# Patient Record
Sex: Male | Born: 1935 | Race: White | Hispanic: No | Marital: Married | Smoking: Former smoker
Health system: Southern US, Community
[De-identification: ages and names within clinical notes are randomized; demographics above are authoritative.]

## PROBLEM LIST (undated history)

## (undated) DIAGNOSIS — I509 Heart failure, unspecified: Secondary | ICD-10-CM

## (undated) DIAGNOSIS — I10 Essential (primary) hypertension: Secondary | ICD-10-CM

## (undated) DIAGNOSIS — R011 Cardiac murmur, unspecified: Secondary | ICD-10-CM

## (undated) DIAGNOSIS — K219 Gastro-esophageal reflux disease without esophagitis: Secondary | ICD-10-CM

## (undated) HISTORY — PX: CHOLECYSTECTOMY: SHX55

## (undated) HISTORY — PX: CATARACT EXTRACTION W/ INTRAOCULAR LENS  IMPLANT, BILATERAL: SHX1307

## (undated) HISTORY — DX: Essential (primary) hypertension: I10

## (undated) HISTORY — PX: COLONOSCOPY: SHX174

## (undated) HISTORY — DX: Cardiac murmur, unspecified: R01.1

## (undated) HISTORY — DX: Gastro-esophageal reflux disease without esophagitis: K21.9

## (undated) HISTORY — DX: Heart failure, unspecified: I50.9

---

## 1999-11-05 ENCOUNTER — Emergency Department (HOSPITAL_COMMUNITY): Admission: EM | Admit: 1999-11-05 | Discharge: 1999-11-05 | Payer: Self-pay | Admitting: *Deleted

## 1999-11-17 ENCOUNTER — Emergency Department (HOSPITAL_COMMUNITY): Admission: EM | Admit: 1999-11-17 | Discharge: 1999-11-18 | Payer: Self-pay | Admitting: Emergency Medicine

## 2012-03-03 ENCOUNTER — Encounter: Payer: Self-pay | Admitting: Gastroenterology

## 2012-11-24 ENCOUNTER — Encounter: Payer: Self-pay | Admitting: Gastroenterology

## 2012-11-25 ENCOUNTER — Encounter: Payer: Self-pay | Admitting: Gastroenterology

## 2013-01-03 ENCOUNTER — Ambulatory Visit (AMBULATORY_SURGERY_CENTER): Payer: Medicare Other | Admitting: *Deleted

## 2013-01-03 VITALS — Ht 71.0 in | Wt 181.8 lb

## 2013-01-03 DIAGNOSIS — Z1211 Encounter for screening for malignant neoplasm of colon: Secondary | ICD-10-CM

## 2013-01-03 MED ORDER — NA SULFATE-K SULFATE-MG SULF 17.5-3.13-1.6 GM/177ML PO SOLN: ORAL | Status: DC

## 2013-01-03 NOTE — Progress Notes (Signed)
No egg or soy allergy  Pt does not have a current email address

## 2013-01-04 ENCOUNTER — Encounter: Payer: Self-pay | Admitting: Gastroenterology

## 2013-01-17 ENCOUNTER — Ambulatory Visit (AMBULATORY_SURGERY_CENTER): Payer: Medicare Other | Admitting: Gastroenterology

## 2013-01-17 ENCOUNTER — Encounter: Payer: Self-pay | Admitting: Gastroenterology

## 2013-01-17 VITALS — BP 122/67 | HR 69 | Temp 96.6°F | Resp 14 | Ht 71.0 in | Wt 181.0 lb

## 2013-01-17 DIAGNOSIS — K5520 Angiodysplasia of colon without hemorrhage: Secondary | ICD-10-CM

## 2013-01-17 DIAGNOSIS — Z1211 Encounter for screening for malignant neoplasm of colon: Secondary | ICD-10-CM

## 2013-01-17 DIAGNOSIS — D126 Benign neoplasm of colon, unspecified: Secondary | ICD-10-CM

## 2013-01-17 DIAGNOSIS — Q2733 Arteriovenous malformation of digestive system vessel: Secondary | ICD-10-CM

## 2013-01-17 DIAGNOSIS — K5730 Diverticulosis of large intestine without perforation or abscess without bleeding: Secondary | ICD-10-CM

## 2013-01-17 MED ORDER — SODIUM CHLORIDE 0.9 % IV SOLN: 500.0000 mL | INTRAVENOUS | Status: DC

## 2013-01-17 NOTE — Progress Notes (Signed)
Patient did not experience any of the following events: a burn prior to discharge; a fall within the facility; wrong site/side/patient/procedure/implant event; or a hospital transfer or hospital admission upon discharge from the facility. (G8907) Patient did not have preoperative order for IV antibiotic SSI prophylaxis. (G8918)  

## 2013-01-17 NOTE — Patient Instructions (Signed)
YOU HAD AN ENDOSCOPIC PROCEDURE TODAY AT THE [facility]: Refer to the procedure report that was given to you for any specific questions about what was found during the examination.  If the procedure report does not answer your questions, please call your gastroenterologist to clarify.  If you requested that your care partner not be given the details of your procedure findings, then the procedure report has been included in a sealed envelope for you to review at your convenience later.  YOU SHOULD EXPECT: Some feelings of bloating in the abdomen. Passage of more gas than usual.  Walking can help get rid of the air that was put into your GI tract during the procedure and reduce the bloating. If you had a lower endoscopy (such as a colonoscopy or flexible sigmoidoscopy) you may notice spotting of blood in your stool or on the toilet paper. If you underwent a bowel prep for your procedure, then you may not have a normal bowel movement for a few days.  DIET: Your first meal following the procedure should be a light meal and then it is ok to progress to your normal diet.  A half-sandwich or bowl of soup is an example of a good first meal.  Heavy or fried foods are harder to digest and may make you feel nauseous or bloated.  Likewise meals heavy in dairy and vegetables can cause extra gas to form and this can also increase the bloating.  Drink plenty of fluids but you should avoid alcoholic beverages for 24 hours.  ACTIVITY: Your care partner should take you home directly after the procedure.  You should plan to take it easy, moving slowly for the rest of the day.  You can resume normal activity the day after the procedure however you should NOT DRIVE or use heavy machinery for 24 hours (because of the sedation medicines used during the test).    SYMPTOMS TO REPORT IMMEDIATELY: A gastroenterologist can be reached at any hour.  During normal business hours, 8:30 AM to 5:00 PM Monday through Friday,  call [phone].  After hours and on weekends, please call the GI answering service at [phone] who will take a message and have the physician on call contact you.   Following lower endoscopy (colonoscopy or flexible sigmoidoscopy):  Excessive amounts of blood in the stool  Significant tenderness or worsening of abdominal pains  Swelling of the abdomen that is new, acute  Fever of 100F or higher    FOLLOW UP: If any biopsies were taken you will be contacted by phone or by letter within the next 1-3 weeks.  Call your gastroenterologist if you have not heard about the biopsies in 3 weeks.  Our staff will call the home number listed on your records the next business day following your procedure to check on you and address any questions or concerns that you may have at that time regarding the information given to you following your procedure. This is a courtesy call and so if there is no answer at the home number and we have not heard from you through the emergency physician on call, we will assume that you have returned to your regular daily activities without incident.  SIGNATURES/CONFIDENTIALITY: You and/or your care partner have signed paperwork which will be entered into your electronic medical record.  These signatures attest to the fact that that the information above on your After Visit Summary has been reviewed and is understood.  Full responsibility of the confidentiality   of this discharge information lies with you and/or your care-partner.     

## 2013-01-17 NOTE — Op Note (Signed)
[facility] [address] [place], [address]   COLONOSCOPY PROCEDURE REPORT  PATIENT: Shaquelle, Hernon  MR#: [id number] BIRTHDATE: 19-May-1936 , 77  yrs. old GENDER: Male ENDOSCOPIST: Louis Meckel, MD REFERRED XB:JYNWG Chilton Si, M.D. PROCEDURE DATE:  01/17/2013 PROCEDURE:   Colonoscopy with snare polypectomy ASA CLASS:   Class II INDICATIONS: MEDICATIONS: MAC sedation, administered by CRNA and propofol (Diprivan) 200mg  IV  DESCRIPTION OF PROCEDURE:   After the risks benefits and alternatives of the procedure were thoroughly explained, informed consent was obtained.  A digital rectal exam revealed no abnormalities of the rectum.   The LB [id number]  endoscope was introduced through the anus and advanced to the cecum, which was identified by both the appendix and ileocecal valve. No adverse events experienced.   The quality of the prep was Suprep good  The instrument was then slowly withdrawn as the colon was fully examined.      COLON FINDINGS: In the cecum there were 4 discreet AVMs, nonbleeding, ranging from 4-64mm in size.  A single AVM was seen in the proximal ascending colon.   A sessile polyp measuring 3 mm in size was found in the rectum.  A polypectomy was performed with a cold snare.  The resection was complete and the polyp tissue was completely retrieved.   Mild diverticulosis was noted in the sigmoid colon.  Retroflexed views revealed no abnormalities. The time to cecum=1 minutes 39 seconds.  Withdrawal time=10 minutes 35 seconds.  The scope was withdrawn and the procedure completed. COMPLICATIONS: There were no complications.  ENDOSCOPIC IMPRESSION: 1.   In the cecum there were 4 discreet AVMs, nonbleeding, ranging from 4-5mm in size.  A single AVM was seen in the proximal ascending colon. 2.   Sessile polyp measuring 3 mm in size was found in the rectum; polypectomy was performed with a cold snare 3.   Mild diverticulosis was noted  in the sigmoid colon  RECOMMENDATIONS: Given your age, you will not need another colonoscopy for colon cancer screening or polyp surveillance.  These types of tests usually stop around the age 79.   eSigned:  Louis Meckel, MD 01/17/2013 9:14 AM   cc:   PATIENT NAME:  Jaedon, Siler MR#: [id number]

## 2013-01-17 NOTE — Progress Notes (Signed)
Called to room to assist during endoscopic procedure.  Patient ID and intended procedure confirmed with present staff. Received instructions for my participation in the procedure from the performing physician.  

## 2013-01-18 ENCOUNTER — Telehealth: Payer: Self-pay

## 2013-01-18 NOTE — Telephone Encounter (Signed)
  Follow up Call-  Call back number 01/17/2013  Post procedure Call Back phone  # [phone]  Permission to leave phone message Yes     Patient questions:  Do you have a fever, pain , or abdominal swelling? no Pain Score  0 *  Have you tolerated food without any problems? yes  Have you been able to return to your normal activities? yes  Do you have any questions about your discharge instructions: Diet   no Medications  no Follow up visit  no  Do you have questions or concerns about your Care? no  Actions: * If pain score is 4 or above: No action needed, pain <4.

## 2013-01-24 ENCOUNTER — Encounter: Payer: Self-pay | Admitting: Gastroenterology

## 2014-09-24 ENCOUNTER — Other Ambulatory Visit: Payer: Self-pay | Admitting: Dermatology

## 2019-06-15 DIAGNOSIS — I10 Essential (primary) hypertension: Secondary | ICD-10-CM | POA: Insufficient documentation

## 2019-06-15 DIAGNOSIS — B0229 Other postherpetic nervous system involvement: Secondary | ICD-10-CM | POA: Insufficient documentation

## 2019-06-15 DIAGNOSIS — K219 Gastro-esophageal reflux disease without esophagitis: Secondary | ICD-10-CM | POA: Insufficient documentation

## 2019-06-15 DIAGNOSIS — Z720 Tobacco use: Secondary | ICD-10-CM | POA: Insufficient documentation

## 2019-09-18 DIAGNOSIS — Z0000 Encounter for general adult medical examination without abnormal findings: Secondary | ICD-10-CM | POA: Insufficient documentation

## 2020-04-13 ENCOUNTER — Ambulatory Visit: Payer: Medicare Other | Attending: Internal Medicine

## 2020-04-13 DIAGNOSIS — Z23 Encounter for immunization: Secondary | ICD-10-CM

## 2020-04-13 NOTE — Progress Notes (Signed)
   Covid-19 Vaccination Clinic  Name:  TREMAR WICKENS    MRN: [id number] DOB: 01-22-1936  04/13/2020  Mr. Haliburton was observed post Covid-19 immunization for 15 minutes without incident. He was provided with Vaccine Information Sheet and instruction to access the V-Safe system.   Mr. Gittleman was instructed to call 911 with any severe reactions post vaccine: Marland Kitchen Difficulty breathing  . Swelling of face and throat  . A fast heartbeat  . A bad rash all over body  . Dizziness and weakness

## 2021-02-04 ENCOUNTER — Other Ambulatory Visit: Payer: Self-pay

## 2021-02-04 ENCOUNTER — Emergency Department (HOSPITAL_BASED_OUTPATIENT_CLINIC_OR_DEPARTMENT_OTHER)
Admission: EM | Admit: 2021-02-04 | Discharge: 2021-02-04 | Disposition: A | Discharge status: Home / Self Care | Payer: Medicare Other | Attending: Emergency Medicine | Admitting: Emergency Medicine

## 2021-02-04 ENCOUNTER — Encounter (HOSPITAL_BASED_OUTPATIENT_CLINIC_OR_DEPARTMENT_OTHER): Payer: Self-pay | Admitting: Obstetrics and Gynecology

## 2021-02-04 DIAGNOSIS — N3001 Acute cystitis with hematuria: Secondary | ICD-10-CM | POA: Diagnosis not present

## 2021-02-04 DIAGNOSIS — Z79899 Other long term (current) drug therapy: Secondary | ICD-10-CM | POA: Insufficient documentation

## 2021-02-04 DIAGNOSIS — Z7982 Long term (current) use of aspirin: Secondary | ICD-10-CM | POA: Diagnosis not present

## 2021-02-04 DIAGNOSIS — I10 Essential (primary) hypertension: Secondary | ICD-10-CM | POA: Diagnosis not present

## 2021-02-04 DIAGNOSIS — F17210 Nicotine dependence, cigarettes, uncomplicated: Secondary | ICD-10-CM | POA: Insufficient documentation

## 2021-02-04 DIAGNOSIS — R350 Frequency of micturition: Secondary | ICD-10-CM | POA: Diagnosis present

## 2021-02-04 LAB — URINALYSIS, ROUTINE W REFLEX MICROSCOPIC
Bilirubin Urine: NEGATIVE
Glucose, UA: NEGATIVE mg/dL
Ketones, ur: NEGATIVE mg/dL
Nitrite: POSITIVE — AB
Protein, ur: 30 mg/dL — AB
RBC / HPF: >50 RBC/hpf — ABNORMAL HIGH (ref 0–5)
Specific Gravity, Urine: 1.019 (ref 1.005–1.030)
WBC, UA: >50 WBC/hpf — ABNORMAL HIGH (ref 0–5)
pH: 8 (ref 5.0–8.0)

## 2021-02-04 MED ORDER — CEPHALEXIN 250 MG PO CAPS
500.0000 mg | ORAL_CAPSULE | Freq: Once | ORAL | Status: AC
  Administered 2021-02-04: 500 mg via ORAL
  Filled 2021-02-04: qty 2

## 2021-02-04 MED ORDER — PHENAZOPYRIDINE HCL 100 MG PO TABS
200.0000 mg | ORAL_TABLET | Freq: Once | ORAL | Status: AC
  Administered 2021-02-04: 200 mg via ORAL
  Filled 2021-02-04: qty 2

## 2021-02-04 MED ORDER — CEPHALEXIN 500 MG PO CAPS: 500.0000 mg | ORAL_CAPSULE | Freq: Four times a day (QID) | ORAL | 0 refills | Status: DC

## 2021-02-04 NOTE — ED Notes (Signed)
This RN presented the AVS utilizing Teachback Method. Patient verbalizes understanding of Discharge Instructions. Opportunity for Questioning and Answers were provided. Patient Discharged from ED ambulatory to Home.   

## 2021-02-04 NOTE — ED Triage Notes (Signed)
Patient reports frequent urination that "hurts like hell" and reports he is only getting 5-6 teaspoons of urine at a time. Patient reports he is going every 15 minutes.

## 2021-02-04 NOTE — ED Provider Notes (Signed)
[facility] Provider Note   CSN: [id number] Arrival date & time: 02/04/21  1951     History Chief Complaint  Patient presents with   Urinary Frequency    Perry Rosales is a 85 y.o. male.  Patient is an 85 year old male with a history of GERD and hypertension and frequent urination who is presenting today with symptoms starting around noon of feeling feverish, malaise and urinary frequency urgency and dysuria.  He reports for some time now he has had hesitation with urinating and it takes a while to get going with a weak stream but today is the first time he has had any pain with urinating.  He denies any abdominal pain or back pain.  No cough, congestion, chest pain, shortness of breath.  He did take some Aleve at home prior to coming and reports he feels much better currently.  He does feel like he is emptying his bladder and does not have symptoms that feel like retention.  The history is provided by the patient.  Urinary Frequency      Past Medical History:  Diagnosis Date   GERD (gastroesophageal reflux disease)    Heart murmur    Hypertension     There are no problems to display for this patient.   Past Surgical History:  Procedure Laterality Date   CATARACT EXTRACTION W/ INTRAOCULAR LENS  IMPLANT, BILATERAL     CHOLECYSTECTOMY     COLONOSCOPY         Family History  Problem Relation Age of Onset   Colon cancer Neg Hx    Esophageal cancer Neg Hx    Rectal cancer Neg Hx    Stomach cancer Neg Hx     Social History   Tobacco Use   Smoking status: Every Day    Packs/day: 0.50    Years: 60.00    Pack years: 30.00    Types: Cigarettes    Passive exposure: Current   Smokeless tobacco: Never  Vaping Use   Vaping Use: Never used  Substance Use Topics   Alcohol use: No   Drug use: No    Home Medications Prior to Admission medications   Medication Sig Start Date End Date Taking? Authorizing Provider  amitriptyline (ELAVIL)  50 MG tablet Take 50 mg by mouth at bedtime.   Yes [provider]  aspirin 81 MG tablet Take 81 mg by mouth daily.   Yes [provider]  cephALEXin (KEFLEX) 500 MG capsule Take 1 capsule (500 mg total) by mouth 4 (four) times daily. 02/04/21  Yes Blanchie Dessert, MD  ibuprofen (ADVIL) 200 MG tablet Take 400 mg by mouth every 6 (six) hours as needed.   Yes [provider]  lisinopril (PRINIVIL,ZESTRIL) 20 MG tablet Take 10 mg by mouth daily.   Yes [provider]  omeprazole (PRILOSEC) 20 MG capsule Take 20 mg by mouth daily.   Yes [provider]  tamsulosin (FLOMAX) 0.4 MG CAPS capsule Take 0.8 mg by mouth.   Yes [provider]  traMADol-acetaminophen (ULTRACET) 37.5-325 MG per tablet Take 1 tablet by mouth every 6 (six) hours as needed for pain. Takes 5 tablets daily   Yes [provider]    Allergies    Patient has no known allergies.  Review of Systems   Review of Systems  Genitourinary:  Positive for frequency.  All other systems reviewed and are negative.  Physical Exam Updated Vital Signs BP (!) 165/57 (BP Location: Left Arm)  Pulse 89   Temp 99 F (37.2 C)   Resp 16   SpO2 97%   Physical Exam Vitals and nursing note reviewed.  Constitutional:      General: He is not in acute distress.    Appearance: He is well-developed.  HENT:     Head: Normocephalic and atraumatic.  Eyes:     Conjunctiva/sclera: Conjunctivae normal.     Pupils: Pupils are equal, round, and reactive to light.  Cardiovascular:     Rate and Rhythm: Normal rate and regular rhythm.     Heart sounds: No murmur heard. Pulmonary:     Effort: Pulmonary effort is normal. No respiratory distress.     Breath sounds: Normal breath sounds. No wheezing or rales.  Abdominal:     General: There is no distension.     Palpations: Abdomen is soft.     Tenderness: There is no abdominal tenderness. There is no guarding or rebound.  Musculoskeletal:         General: No tenderness. Normal range of motion.     Cervical back: Normal range of motion and neck supple.  Skin:    General: Skin is warm and dry.     Findings: No erythema or rash.  Neurological:     Mental Status: He is alert and oriented to person, place, and time. Mental status is at baseline.  Psychiatric:        Mood and Affect: Mood normal.        Behavior: Behavior normal.    ED Results / Procedures / Treatments   Labs (all labs ordered are listed, but only abnormal results are displayed) Labs Reviewed  URINALYSIS, ROUTINE W REFLEX MICROSCOPIC - Abnormal; Notable for the following components:      Result Value   APPearance CLOUDY (*)    Hgb urine dipstick MODERATE (*)    Protein, ur 30 (*)    Nitrite POSITIVE (*)    Leukocytes,Ua LARGE (*)    RBC / HPF >50 (*)    WBC, UA >50 (*)    Bacteria, UA MANY (*)    Non Squamous Epithelial 0-5 (*)    All other components within normal limits    EKG None  Radiology No results found.  Procedures Procedures   Medications Ordered in ED Medications  cephALEXin (KEFLEX) capsule 500 mg (has no administration in time range)  phenazopyridine (PYRIDIUM) tablet 200 mg (has no administration in time range)    ED Course  I have reviewed the triage vital signs and the nursing notes.  Pertinent labs & imaging results that were available during my care of the patient were reviewed by me and considered in my medical decision making (see chart for details).    MDM Rules/Calculators/A&P                           Patient presenting with symptoms most consistent with a UTI.  He has no abdominal pain or flank pain consistent with pyelonephritis at this time.  He is not having symptoms significant for urinary retention.  UA consistent with a UTI.  Does not ever recall having a UTI in the past.  We will treat with Keflex.  Urine culture sent.  Patient does have follow-up with urology later this month as he has had more more  issues most consistent with prostate enlargement.  MDM   Amount and/or Complexity of Data Reviewed Clinical lab tests: ordered and reviewed Independent visualization  of images, tracings, or specimens: yes    Final Clinical Impression(s) / ED Diagnoses Final diagnoses:  Acute cystitis with hematuria    Rx / DC Orders ED Discharge Orders          Ordered    cephALEXin (KEFLEX) 500 MG capsule  4 times daily        02/04/21 2300             Blanchie Dessert, MD 02/04/21 2306

## 2021-02-07 LAB — URINE CULTURE: Culture: >=100000 — AB

## 2021-02-08 ENCOUNTER — Telehealth: Payer: Self-pay | Admitting: Emergency Medicine

## 2021-02-08 NOTE — Telephone Encounter (Signed)
Post ED Visit - Positive Culture Follow-up  Culture report reviewed by antimicrobial stewardship pharmacist: [facility] Team '[]'$  [address], Pharm.D. '[]'$  Heide Guile, Pharm.D., BCPS AQ-ID '[]'$  Parks Neptune, Pharm.D., BCPS '[]'$  Alycia Rossetti, Pharm.D., BCPS '[]'$  [place], Pharm.D., BCPS, AAHIVP '[]'$  Legrand Como, Pharm.D., BCPS, AAHIVP '[]'$  Salome Arnt, PharmD, BCPS '[]'$  Johnnette Gourd, PharmD, BCPS '[]'$  Hughes Better, PharmD, BCPS '[x]'$  Lorelei Pont, PharmD '[]'$  Laqueta Linden, PharmD, BCPS '[]'$  Albertina Parr, PharmD  [facility] Team '[]'$  Leodis Sias, PharmD '[]'$  Lindell Spar, PharmD '[]'$  Royetta Asal, PharmD '[]'$  Graylin Shiver, Rph '[]'$  Rema Fendt) Glennon Mac, PharmD '[]'$  Arlyn Dunning, PharmD '[]'$  Netta Cedars, PharmD '[]'$  Dia Sitter, PharmD '[]'$  Leone Haven, PharmD '[]'$  Gretta Arab, PharmD '[]'$  Theodis Shove, PharmD '[]'$  Peggyann Juba, PharmD '[]'$  Reuel Boom, PharmD   Positive urine culture Treated with Cephalexin, organism sensitive to the same and no further patient follow-up is required at this time.  Milus Mallick 02/08/2021, 12:43 PM

## 2021-02-09 ENCOUNTER — Encounter (HOSPITAL_BASED_OUTPATIENT_CLINIC_OR_DEPARTMENT_OTHER): Payer: Self-pay

## 2021-02-09 ENCOUNTER — Inpatient Hospital Stay (HOSPITAL_BASED_OUTPATIENT_CLINIC_OR_DEPARTMENT_OTHER)
Admission: EM | Admit: 2021-02-09 | Discharge: 2021-02-12 | DRG: 683 | Disposition: A | Discharge status: Home / Self Care | Payer: Medicare Other | Attending: Family Medicine | Admitting: Family Medicine

## 2021-02-09 ENCOUNTER — Emergency Department (HOSPITAL_BASED_OUTPATIENT_CLINIC_OR_DEPARTMENT_OTHER): Payer: Medicare Other

## 2021-02-09 ENCOUNTER — Other Ambulatory Visit: Payer: Self-pay | Admitting: Internal Medicine

## 2021-02-09 ENCOUNTER — Other Ambulatory Visit: Payer: Self-pay

## 2021-02-09 DIAGNOSIS — Z9049 Acquired absence of other specified parts of digestive tract: Secondary | ICD-10-CM

## 2021-02-09 DIAGNOSIS — I2510 Atherosclerotic heart disease of native coronary artery without angina pectoris: Secondary | ICD-10-CM | POA: Diagnosis present

## 2021-02-09 DIAGNOSIS — N179 Acute kidney failure, unspecified: Principal | ICD-10-CM | POA: Diagnosis present

## 2021-02-09 DIAGNOSIS — N10 Acute tubulo-interstitial nephritis: Secondary | ICD-10-CM

## 2021-02-09 DIAGNOSIS — Z20822 Contact with and (suspected) exposure to covid-19: Secondary | ICD-10-CM | POA: Diagnosis present

## 2021-02-09 DIAGNOSIS — R338 Other retention of urine: Secondary | ICD-10-CM

## 2021-02-09 DIAGNOSIS — R197 Diarrhea, unspecified: Secondary | ICD-10-CM

## 2021-02-09 DIAGNOSIS — N138 Other obstructive and reflux uropathy: Secondary | ICD-10-CM | POA: Diagnosis present

## 2021-02-09 DIAGNOSIS — I714 Abdominal aortic aneurysm, without rupture, unspecified: Secondary | ICD-10-CM

## 2021-02-09 DIAGNOSIS — F17210 Nicotine dependence, cigarettes, uncomplicated: Secondary | ICD-10-CM | POA: Diagnosis present

## 2021-02-09 DIAGNOSIS — Z79899 Other long term (current) drug therapy: Secondary | ICD-10-CM

## 2021-02-09 DIAGNOSIS — I10 Essential (primary) hypertension: Secondary | ICD-10-CM | POA: Diagnosis present

## 2021-02-09 DIAGNOSIS — N401 Enlarged prostate with lower urinary tract symptoms: Secondary | ICD-10-CM | POA: Diagnosis present

## 2021-02-09 DIAGNOSIS — Z7982 Long term (current) use of aspirin: Secondary | ICD-10-CM

## 2021-02-09 DIAGNOSIS — N390 Urinary tract infection, site not specified: Secondary | ICD-10-CM | POA: Diagnosis present

## 2021-02-09 DIAGNOSIS — B9620 Unspecified Escherichia coli [E. coli] as the cause of diseases classified elsewhere: Secondary | ICD-10-CM | POA: Diagnosis present

## 2021-02-09 HISTORY — DX: Acute kidney failure, unspecified: N17.9

## 2021-02-09 LAB — COMPREHENSIVE METABOLIC PANEL
ALT: 15 U/L (ref 0–44)
AST: 17 U/L (ref 15–41)
Albumin: 4 g/dL (ref 3.5–5.0)
Alkaline Phosphatase: 65 U/L (ref 38–126)
Anion gap: 15 (ref 5–15)
BUN: 58 mg/dL — ABNORMAL HIGH (ref 8–23)
CO2: 22 mmol/L (ref 22–32)
Calcium: 8.3 mg/dL — ABNORMAL LOW (ref 8.9–10.3)
Chloride: 97 mmol/L — ABNORMAL LOW (ref 98–111)
Creatinine, Ser: 3.45 mg/dL — ABNORMAL HIGH (ref 0.61–1.24)
GFR, Estimated: 17 mL/min — ABNORMAL LOW (ref >60)
Glucose, Bld: 110 mg/dL — ABNORMAL HIGH (ref 70–99)
Potassium: 4.2 mmol/L (ref 3.5–5.1)
Sodium: 134 mmol/L — ABNORMAL LOW (ref 135–145)
Total Bilirubin: 0.5 mg/dL (ref 0.3–1.2)
Total Protein: 7.4 g/dL (ref 6.5–8.1)

## 2021-02-09 LAB — CBC WITH DIFFERENTIAL/PLATELET
Abs Immature Granulocytes: 0.51 10*3/uL — ABNORMAL HIGH (ref 0.00–0.07)
Basophils Absolute: 0.1 10*3/uL (ref 0.0–0.1)
Basophils Relative: 1 %
Eosinophils Absolute: 0.3 10*3/uL (ref 0.0–0.5)
Eosinophils Relative: 3 %
HCT: 39.9 % (ref 39.0–52.0)
Hemoglobin: 13.7 g/dL (ref 13.0–17.0)
Immature Granulocytes: 5 %
Lymphocytes Relative: 4 %
Lymphs Abs: 0.4 10*3/uL — ABNORMAL LOW (ref 0.7–4.0)
MCH: 30.6 pg (ref 26.0–34.0)
MCHC: 34.3 g/dL (ref 30.0–36.0)
MCV: 89.3 fL (ref 80.0–100.0)
Monocytes Absolute: 0.9 10*3/uL (ref 0.1–1.0)
Monocytes Relative: 8 %
Neutro Abs: 8.9 10*3/uL — ABNORMAL HIGH (ref 1.7–7.7)
Neutrophils Relative %: 79 %
Platelets: 160 10*3/uL (ref 150–400)
RBC: 4.47 MIL/uL (ref 4.22–5.81)
RDW: 12.4 % (ref 11.5–15.5)
WBC: 11.1 10*3/uL — ABNORMAL HIGH (ref 4.0–10.5)
nRBC: 0 % (ref 0.0–0.2)

## 2021-02-09 LAB — BASIC METABOLIC PANEL
Anion gap: 11 (ref 5–15)
BUN: 48 mg/dL — ABNORMAL HIGH (ref 8–23)
CO2: 23 mmol/L (ref 22–32)
Calcium: 8 mg/dL — ABNORMAL LOW (ref 8.9–10.3)
Chloride: 103 mmol/L (ref 98–111)
Creatinine, Ser: 2.64 mg/dL — ABNORMAL HIGH (ref 0.61–1.24)
GFR, Estimated: 23 mL/min — ABNORMAL LOW (ref >60)
Glucose, Bld: 125 mg/dL — ABNORMAL HIGH (ref 70–99)
Potassium: 3.7 mmol/L (ref 3.5–5.1)
Sodium: 137 mmol/L (ref 135–145)

## 2021-02-09 LAB — URINALYSIS, ROUTINE W REFLEX MICROSCOPIC
Glucose, UA: NEGATIVE mg/dL
Ketones, ur: NEGATIVE mg/dL
Nitrite: POSITIVE — AB
Protein, ur: 30 mg/dL — AB
Specific Gravity, Urine: 1.02 (ref 1.005–1.030)
WBC, UA: >50 WBC/hpf — ABNORMAL HIGH (ref 0–5)
pH: 6 (ref 5.0–8.0)

## 2021-02-09 LAB — RESP PANEL BY RT-PCR (FLU A&B, COVID) ARPGX2
Influenza A by PCR: NEGATIVE
Influenza B by PCR: NEGATIVE
SARS Coronavirus 2 by RT PCR: NEGATIVE

## 2021-02-09 LAB — MAGNESIUM
Magnesium: 0.6 mg/dL — CL (ref 1.7–2.4)
Magnesium: 1.4 mg/dL — ABNORMAL LOW (ref 1.7–2.4)

## 2021-02-09 MED ORDER — SODIUM CHLORIDE 0.9 % IV BOLUS
500.0000 mL | Freq: Once | INTRAVENOUS | Status: AC
  Administered 2021-02-09: 500 mL via INTRAVENOUS

## 2021-02-09 MED ORDER — MAGNESIUM SULFATE 2 GM/50ML IV SOLN
2.0000 g | Freq: Once | INTRAVENOUS | Status: AC
  Administered 2021-02-09: 2 g via INTRAVENOUS
  Filled 2021-02-09: qty 50

## 2021-02-09 MED ORDER — TRAMADOL HCL 50 MG PO TABS
50.0000 mg | ORAL_TABLET | Freq: Once | ORAL | Status: AC
  Administered 2021-02-09: 50 mg via ORAL
  Filled 2021-02-09: qty 1

## 2021-02-09 MED ORDER — MAGNESIUM SULFATE IN D5W 1-5 GM/100ML-% IV SOLN
1.0000 g | Freq: Once | INTRAVENOUS | Status: AC
  Administered 2021-02-09: 1 g via INTRAVENOUS
  Filled 2021-02-09: qty 100

## 2021-02-09 MED ORDER — SODIUM CHLORIDE 0.9 % IV SOLN
1.0000 g | Freq: Once | INTRAVENOUS | Status: AC
  Administered 2021-02-09: 1 g via INTRAVENOUS
  Filled 2021-02-09: qty 10

## 2021-02-09 NOTE — ED Notes (Signed)
hospitalist paged through [place]

## 2021-02-09 NOTE — ED Provider Notes (Signed)
Care of patient assumed from Dr. Reather Converse at 36:55 PM.  85 year old male presenting for 1 week of difficulty with urination.  Was started on Keflex 5 days ago.  Has since had diarrhea and nausea.  Found to have acute renal failure with suspected obstructive etiology.  Urinalysis continues to show infection.  IV Rocephin given.  Plan for admission. Physical Exam  BP (!) 145/59   Pulse 86   Temp 98.3 F (36.8 C) (Oral)   Resp 17   SpO2 95%   Physical Exam Constitutional:      Appearance: Normal appearance. He is not ill-appearing or toxic-appearing.  Genitourinary:    Comments: Foley bag with good orange-pink output Neurological:     Mental Status: He is alert and oriented to person, place, and time.    ED Course/Procedures     Procedures  MDM  Patient remained in the ED, awaiting transfer for admission throughout the shift.  On assessment, sitting in reclining chair with no current complaints.  He is tolerating p.o. intake.  Repeat lab work showed continued hypomagnesemia.  Additional 1 g of IV magnesium was ordered.       Godfrey Pick, MD 02/10/21 [phone]

## 2021-02-09 NOTE — ED Notes (Addendum)
Perry Rosales, [phone], son would like to be contacted with update on patient when he gets a room.

## 2021-02-09 NOTE — ED Notes (Signed)
CRITICAL VALUE STICKER  CRITICAL VALUE:Magnesium 0.6  RECEIVER (on-site recipient of call):Shawnie Pons, RN  DATE & TIME NOTIFIED: 02-09-2021 [phone]  MESSENGER (representative from lab):  MD NOTIFIED: Dr. Reather Converse  TIME OF NOTIFICATION:1000  RESPONSE:

## 2021-02-09 NOTE — ED Notes (Signed)
This nurse realized the bed request had not been made. I reached out to bed placement who stated they would reach out to the hospitalist.

## 2021-02-09 NOTE — ED Triage Notes (Signed)
He tells me he was seen for urinary hesitancy 5 days ago here and was prescribed an antibiotic. He tells me he is having some diarrhea and his urinary symptoms "aren't any better". He is ambulatory and in no distress.

## 2021-02-09 NOTE — Discharge Instructions (Addendum)
Take your Flomax/tamsulosin as previously prescribed. Follow-up with urology at appointment. Do not take your Keflex medication that was causing diarrhea. Use Tylenol as needed for pain or fevers every 4 hours. Return for new or worsening signs or symptoms. Stop taking lisinopril.  We have transitioned you to amlodipine 5 mg p.o. daily to control your blood pressure.

## 2021-02-09 NOTE — ED Notes (Signed)
Pt is back in bed and comfortable.

## 2021-02-09 NOTE — ED Notes (Signed)
Pt c/o muscle cramps in legs. Brought recliner in the room for patient to sit on. Pt states that is much more comfortable. Instructed to call when he is ready to get up. Foley below bladder. Watching TV. Cardiac monitor in place.

## 2021-02-09 NOTE — ED Notes (Addendum)
Called [facility] at 706pm in regards to bed request not being placed, [facility] provided me with doctor name who I sent message to at 710pm and requested that a bed request be made for patient per consult with [facility] at 1035am per 722pm message has not been read. Called [facility] at 738pm for update, stated they called the floor manager who attempted to reach Dr Marcello Moores with no luck so is going to have another hospitalist place the bed request

## 2021-02-09 NOTE — ED Notes (Signed)
Patient reports urinary retention, still having burning with scant urination. Patient also states that he stopped taking his flomax because "I thought it might interfere with my treatment", patient reminded that flomax is prescribed to assist in urination.

## 2021-02-09 NOTE — ED Notes (Signed)
Dr. Reather Converse present during bladder scan and aware of results.

## 2021-02-09 NOTE — ED Provider Notes (Signed)
[facility] EMERGENCY DEPT Provider Note   CSN: [id number] Arrival date & time: 02/09/21  [id number]     History Chief Complaint  Patient presents with   passing small amounts of urine    Perry Rosales is a 85 y.o. male.  Patient with history of reflux, high blood pressure presents with difficulty with urination over the past week.  Patient was seen approximately 5 days ago diagnosed with urinary infection.  Patient has been taking Keflex however has had gradually worsening diarrhea and nausea side effect from the antibiotics.  Patient continues to have fullness in his bladder and only emptying he feels 5 to 10%.  Patient denies any significant history of prostate pathology known.  Patient has urology appointment on Monday.  No fevers chills or vomiting.  No blood in the diarrhea.  Patient stopped taking Flomax when he started antibiotics as he thought they would interact.      Past Medical History:  Diagnosis Date   GERD (gastroesophageal reflux disease)    Heart murmur    Hypertension     There are no problems to display for this patient.   Past Surgical History:  Procedure Laterality Date   CATARACT EXTRACTION W/ INTRAOCULAR LENS  IMPLANT, BILATERAL     CHOLECYSTECTOMY     COLONOSCOPY         Family History  Problem Relation Age of Onset   Colon cancer Neg Hx    Esophageal cancer Neg Hx    Rectal cancer Neg Hx    Stomach cancer Neg Hx     Social History   Tobacco Use   Smoking status: Every Day    Packs/day: 0.50    Years: 60.00    Pack years: 30.00    Types: Cigarettes    Passive exposure: Current   Smokeless tobacco: Never  Vaping Use   Vaping Use: Never used  Substance Use Topics   Alcohol use: No   Drug use: No    Home Medications Prior to Admission medications   Medication Sig Start Date End Date Taking? Authorizing Provider  amitriptyline (ELAVIL) 50 MG tablet Take 50 mg by mouth at bedtime.    [provider]  aspirin 81  MG tablet Take 81 mg by mouth daily.    [provider]  cephALEXin (KEFLEX) 500 MG capsule Take 1 capsule (500 mg total) by mouth 4 (four) times daily. 02/04/21   Blanchie Dessert, MD  ibuprofen (ADVIL) 200 MG tablet Take 400 mg by mouth every 6 (six) hours as needed.    [provider]  lisinopril (PRINIVIL,ZESTRIL) 20 MG tablet Take 10 mg by mouth daily.    [provider]  omeprazole (PRILOSEC) 20 MG capsule Take 20 mg by mouth daily.    [provider]  tamsulosin (FLOMAX) 0.4 MG CAPS capsule Take 0.8 mg by mouth.    [provider]  traMADol-acetaminophen (ULTRACET) 37.5-325 MG per tablet Take 1 tablet by mouth every 6 (six) hours as needed for pain. Takes 5 tablets daily    [provider]    Allergies    Patient has no known allergies.  Review of Systems   Review of Systems  Constitutional:  Negative for chills and fever.  HENT:  Negative for congestion.   Eyes:  Negative for visual disturbance.  Respiratory:  Negative for shortness of breath.   Cardiovascular:  Negative for chest pain.  Gastrointestinal:  Positive for diarrhea and nausea. Negative for abdominal pain and vomiting.  Genitourinary:  Positive for difficulty urinating and frequency. Negative for dysuria and flank pain.  Musculoskeletal:  Negative for back pain, neck pain and neck stiffness.  Skin:  Negative for rash.  Neurological:  Negative for light-headedness and headaches.   Physical Exam Updated Vital Signs BP (!) 141/76   Pulse 87   Temp 98.3 F (36.8 C) (Oral)   Resp 16   SpO2 95%   Physical Exam Vitals and nursing note reviewed.  Constitutional:      General: He is not in acute distress.    Appearance: He is well-developed.  HENT:     Head: Normocephalic and atraumatic.     Mouth/Throat:     Mouth: Mucous membranes are dry.  Eyes:     General:        Right eye: No discharge.        Left eye: No discharge.     Conjunctiva/sclera:  Conjunctivae normal.  Neck:     Trachea: No tracheal deviation.  Cardiovascular:     Rate and Rhythm: Normal rate.     Heart sounds: No murmur heard. Pulmonary:     Effort: Pulmonary effort is normal.  Abdominal:     General: There is no distension.     Palpations: Abdomen is soft.     Tenderness: There is abdominal tenderness. There is no guarding.  Musculoskeletal:        General: No swelling. Normal range of motion.     Cervical back: Normal range of motion and neck supple. No rigidity.  Skin:    General: Skin is warm.     Capillary Refill: Capillary refill takes less than 2 seconds.     Findings: No rash.  Neurological:     General: No focal deficit present.     Mental Status: He is alert.     Cranial Nerves: No cranial nerve deficit.  Psychiatric:        Mood and Affect: Mood normal.    ED Results / Procedures / Treatments   Labs (all labs ordered are listed, but only abnormal results are displayed) Labs Reviewed  COMPREHENSIVE METABOLIC PANEL - Abnormal; Notable for the following components:      Result Value   Sodium 134 (*)    Chloride 97 (*)    Glucose, Bld 110 (*)    BUN 58 (*)    Creatinine, Ser 3.45 (*)    Calcium 8.3 (*)    GFR, Estimated 17 (*)    All other components within normal limits  CBC WITH DIFFERENTIAL/PLATELET - Abnormal; Notable for the following components:   WBC 11.1 (*)    Neutro Abs 8.9 (*)    Lymphs Abs 0.4 (*)    Abs Immature Granulocytes 0.51 (*)    All other components within normal limits  MAGNESIUM - Abnormal; Notable for the following components:   Magnesium 0.6 (*)    All other components within normal limits  URINALYSIS, ROUTINE W REFLEX MICROSCOPIC - Abnormal; Notable for the following components:   Color, Urine ORANGE (*)    APPearance HAZY (*)    Hgb urine dipstick SMALL (*)    Bilirubin Urine MODERATE (*)    Protein, ur 30 (*)    Nitrite POSITIVE (*)    Leukocytes,Ua LARGE (*)    WBC, UA >50 (*)    Bacteria, UA FEW  (*)    All other components within normal limits  URINE CULTURE  RESP PANEL BY RT-PCR (FLU A&B, COVID) ARPGX2  EKG None  Radiology CT Renal Stone Study  Result Date: 02/09/2021 CLINICAL DATA:  Urinary retention.  UTI. EXAM: CT ABDOMEN AND PELVIS WITHOUT CONTRAST TECHNIQUE: Multidetector CT imaging of the abdomen and pelvis was performed following the standard protocol without IV contrast. COMPARISON:  None. FINDINGS: Lower chest: Three-vessel coronary artery disease. No other abnormalities. Hepatobiliary: No focal liver abnormality is seen. Status post cholecystectomy. No biliary dilatation. Pancreas: Unremarkable. No pancreatic ductal dilatation or surrounding inflammatory changes. Spleen: Normal in size without focal abnormality. Adrenals/Urinary Tract: Adrenal glands are normal. No renal stones. There is a cyst in the upper pole the right kidney. No suspicious masses. No suspicious perinephric stranding or hydronephrosis. The left ureter is normal with no stones or dilatation. 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 The right ureters are mildly prominent compared to the left. However, no ureteral stones are noted. The bladder is decompressed with a Foley catheter. Stomach/Bowel: The bladder is diffusely thick walled in appearance. The small bowel is normal. Colonic diverticulosis is seen without diverticulitis. The remainder of the colon is normal. The appendix is normal. Vascular/Lymphatic: Calcified atherosclerosis is seen in the abdominal aorta. The infrarenal abdominal aorta measures up to 3.1 cm. Calcified atherosclerosis extends into the iliac and femoral vessels. No adenopathy. Reproductive: The prostate appears prominent. Other: No free air free fluid.  Fat containing umbilical hernia. Musculoskeletal: Age indeterminate compression fracture of T11 with approximately 50% loss of vertebral body height. IMPRESSION: 1. No renal or ureteral stones identified.  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 However, there are no stones seen along the course of the right ureters. The bladder is decompressed with a Foley catheter. 2. The stomach appears diffusely thick walled. Whether this is a true finding or due to decompression is unclear. Recommend upper GI or direct visualization. 3. Three-vessel coronary artery disease. 4. Colonic diverticulosis without diverticulitis. 5. Calcified atherosclerosis in the abdominal aorta. 6. The infrarenal abdominal aorta measures 3.1 cm. Recommend follow-up ultrasound every 3 years. This recommendation follows ACR consensus guidelines: White Paper of the ACR Incidental Findings Committee II on Vascular Findings. J Am Coll Radiol 2013; 10:789-794. 7. Age-indeterminate compression fracture of T11 with 50% loss of vertebral body height. Recommend clinical correlation. Electronically Signed   By: Dorise Bullion III M.D.   On: 02/09/2021 11:28    Procedures .Critical Care  Date/Time: 02/09/2021 11:46 AM Performed by: Elnora Morrison, MD Authorized by: Elnora Morrison, MD   Critical care provider statement:    Critical care time (minutes):  40   Critical care start time:  02/09/2021 11:00 AM   Critical care end time:  02/09/2021 11:40 AM   Critical care time was exclusive of:  Separately billable procedures and treating other patients and teaching time   Critical care was necessary to treat or prevent imminent or life-threatening deterioration of the following conditions:  Renal failure   Critical care was time spent personally by me on the following activities:  Discussions with consultants, evaluation of patient's response to treatment, examination of patient, ordering and performing treatments and interventions, ordering and review of laboratory studies, ordering and review of radiographic studies, pulse oximetry, re-evaluation of patient's  condition, obtaining history from patient or surrogate and review of old charts   Medications Ordered in ED Medications  magnesium sulfate IVPB 2 g 50 mL (has no administration in time range)  sodium chloride 0.9 %  bolus 500 mL (0 mLs Intravenous Stopped 02/09/21 1140)  cefTRIAXone (ROCEPHIN) 1 g in sodium chloride 0.9 % 100 mL IVPB (0 g Intravenous Stopped 02/09/21 1140)    ED Course  I have reviewed the triage vital signs and the nursing notes.  Pertinent labs & imaging results that were available during my care of the patient were reviewed by me and considered in my medical decision making (see chart for details).    MDM Rules/Calculators/A&P                           Patient presents with clinical concern for acute urinary retention secondary to enlarged prostate/urine infection.  Likely combination of infection and stopped taking his Flomax with underlying BPH.  Patient overall well-appearing no signs of severe dehydration.  Plan for blood work to check for electrolytes and kidney function/kidney failure, urinalysis.  Patient had large retention on bedside ultrasound measurement performed by technician.  Foley catheter inserted and follow-up with urology had been previously arranged.  Patient's blood work reviewed revealing acute renal failure secondary to obstruction from likely prostate enlargement with creatinine 3.45/BUN 58, sodium 134, white blood cell count 11.1 secondary to infection.  Urinalysis reviewed showing worsening infection with clumps, positive nitrate, elevated leukocytes and bacteria.  IV Rocephin ordered for kidney infection.  CT scan ordered for further delineation showed duplicate right ureter, no stones, coronary artery disease and 3.1 cm abdominal aortic aneurysm that will need outpatient follow-up.  IV abx and IV magnesium ordered.  Page/consult for hospitalist to admit to [facility].  Paged urology.  Likely plan for IV fluids and reassessment of blood  work.    Final Clinical Impression(s) / ED Diagnoses Final diagnoses:  Acute urinary retention  Diarrhea, unspecified type  Acute renal failure, unspecified acute renal failure type ([place])  Acute pyelonephritis  Hypomagnesemia  Abdominal aortic aneurysm  Rx / DC Orders ED Discharge Orders     None        Elnora Morrison, MD 02/09/21 1147

## 2021-02-10 ENCOUNTER — Observation Stay (HOSPITAL_BASED_OUTPATIENT_CLINIC_OR_DEPARTMENT_OTHER): Payer: Medicare Other

## 2021-02-10 ENCOUNTER — Observation Stay (HOSPITAL_COMMUNITY): Payer: Medicare Other

## 2021-02-10 ENCOUNTER — Encounter (HOSPITAL_COMMUNITY): Payer: Self-pay | Admitting: Internal Medicine

## 2021-02-10 DIAGNOSIS — Z9049 Acquired absence of other specified parts of digestive tract: Secondary | ICD-10-CM | POA: Diagnosis not present

## 2021-02-10 DIAGNOSIS — F17210 Nicotine dependence, cigarettes, uncomplicated: Secondary | ICD-10-CM | POA: Diagnosis not present

## 2021-02-10 DIAGNOSIS — R609 Edema, unspecified: Secondary | ICD-10-CM | POA: Diagnosis not present

## 2021-02-10 DIAGNOSIS — I2510 Atherosclerotic heart disease of native coronary artery without angina pectoris: Secondary | ICD-10-CM | POA: Diagnosis not present

## 2021-02-10 DIAGNOSIS — N179 Acute kidney failure, unspecified: Principal | ICD-10-CM

## 2021-02-10 DIAGNOSIS — Z79899 Other long term (current) drug therapy: Secondary | ICD-10-CM | POA: Diagnosis not present

## 2021-02-10 DIAGNOSIS — I10 Essential (primary) hypertension: Secondary | ICD-10-CM | POA: Diagnosis not present

## 2021-02-10 DIAGNOSIS — I714 Abdominal aortic aneurysm, without rupture: Secondary | ICD-10-CM | POA: Diagnosis not present

## 2021-02-10 DIAGNOSIS — R338 Other retention of urine: Secondary | ICD-10-CM | POA: Diagnosis not present

## 2021-02-10 DIAGNOSIS — Z20822 Contact with and (suspected) exposure to covid-19: Secondary | ICD-10-CM | POA: Diagnosis not present

## 2021-02-10 DIAGNOSIS — B9620 Unspecified Escherichia coli [E. coli] as the cause of diseases classified elsewhere: Secondary | ICD-10-CM | POA: Diagnosis not present

## 2021-02-10 DIAGNOSIS — N138 Other obstructive and reflux uropathy: Secondary | ICD-10-CM | POA: Diagnosis not present

## 2021-02-10 DIAGNOSIS — Z7982 Long term (current) use of aspirin: Secondary | ICD-10-CM | POA: Diagnosis not present

## 2021-02-10 DIAGNOSIS — N390 Urinary tract infection, site not specified: Secondary | ICD-10-CM | POA: Diagnosis not present

## 2021-02-10 DIAGNOSIS — R600 Localized edema: Secondary | ICD-10-CM | POA: Diagnosis not present

## 2021-02-10 DIAGNOSIS — N401 Enlarged prostate with lower urinary tract symptoms: Secondary | ICD-10-CM | POA: Diagnosis not present

## 2021-02-10 LAB — BASIC METABOLIC PANEL
Anion gap: 9 (ref 5–15)
BUN: 43 mg/dL — ABNORMAL HIGH (ref 8–23)
CO2: 27 mmol/L (ref 22–32)
Calcium: 8.6 mg/dL — ABNORMAL LOW (ref 8.9–10.3)
Chloride: 101 mmol/L (ref 98–111)
Creatinine, Ser: 1.84 mg/dL — ABNORMAL HIGH (ref 0.61–1.24)
GFR, Estimated: 36 mL/min — ABNORMAL LOW (ref >60)
Glucose, Bld: 121 mg/dL — ABNORMAL HIGH (ref 70–99)
Potassium: 3.7 mmol/L (ref 3.5–5.1)
Sodium: 137 mmol/L (ref 135–145)

## 2021-02-10 LAB — URINE CULTURE: Culture: NO GROWTH

## 2021-02-10 LAB — ECHOCARDIOGRAM COMPLETE
AR max vel: 1.66 cm2
AV Area VTI: 1.89 cm2
AV Area mean vel: 1.8 cm2
AV Mean grad: 8.5 mmHg
AV Peak grad: 15.1 mmHg
Ao pk vel: 1.95 m/s
Area-P 1/2: 4.06 cm2
Calc EF: 59.3 %
Height: 71 in
S' Lateral: 2.4 cm
Single Plane A2C EF: 59.3 %
Single Plane A4C EF: 59.2 %
Weight: 2532.64 oz

## 2021-02-10 LAB — MAGNESIUM: Magnesium: 1.4 mg/dL — ABNORMAL LOW (ref 1.7–2.4)

## 2021-02-10 LAB — BRAIN NATRIURETIC PEPTIDE: B Natriuretic Peptide: 156.1 pg/mL — ABNORMAL HIGH (ref 0.0–100.0)

## 2021-02-10 MED ORDER — ASPIRIN 81 MG PO TABS: 81.0000 mg | ORAL_TABLET | Freq: Every day | ORAL | Status: DC

## 2021-02-10 MED ORDER — PERFLUTREN LIPID MICROSPHERE
1.0000 mL | INTRAVENOUS | Status: AC | PRN
  Administered 2021-02-10: 2 mL via INTRAVENOUS
  Filled 2021-02-10: qty 10

## 2021-02-10 MED ORDER — CEFAZOLIN SODIUM-DEXTROSE 1-4 GM/50ML-% IV SOLN
1.0000 g | Freq: Three times a day (TID) | INTRAVENOUS | Status: DC
  Administered 2021-02-11: 1 g via INTRAVENOUS
  Filled 2021-02-10: qty 50

## 2021-02-10 MED ORDER — AMITRIPTYLINE HCL 25 MG PO TABS
50.0000 mg | ORAL_TABLET | Freq: Every day | ORAL | Status: DC
  Administered 2021-02-10 – 2021-02-11 (×2): 50 mg via ORAL
  Filled 2021-02-10 (×3): qty 2

## 2021-02-10 MED ORDER — HEPARIN SODIUM (PORCINE) 5000 UNIT/ML IJ SOLN
5000.0000 [IU] | Freq: Two times a day (BID) | INTRAMUSCULAR | Status: DC
  Administered 2021-02-10 – 2021-02-12 (×5): 5000 [IU] via SUBCUTANEOUS
  Filled 2021-02-10 (×5): qty 1

## 2021-02-10 MED ORDER — ASPIRIN EC 81 MG PO TBEC
81.0000 mg | DELAYED_RELEASE_TABLET | Freq: Every day | ORAL | Status: DC
  Administered 2021-02-10 – 2021-02-12 (×3): 81 mg via ORAL
  Filled 2021-02-10 (×3): qty 1

## 2021-02-10 MED ORDER — CHLORHEXIDINE GLUCONATE CLOTH 2 % EX PADS
6.0000 | MEDICATED_PAD | Freq: Every day | CUTANEOUS | Status: DC
  Administered 2021-02-11 – 2021-02-12 (×2): 6 via TOPICAL

## 2021-02-10 MED ORDER — HYDRALAZINE HCL 20 MG/ML IJ SOLN
5.0000 mg | Freq: Four times a day (QID) | INTRAMUSCULAR | Status: DC | PRN
  Administered 2021-02-10: 5 mg via INTRAVENOUS
  Filled 2021-02-10: qty 1

## 2021-02-10 MED ORDER — SODIUM CHLORIDE 0.9 % IV SOLN
1.0000 g | Freq: Every day | INTRAVENOUS | Status: DC
  Administered 2021-02-10: 1 g via INTRAVENOUS
  Filled 2021-02-10: qty 1

## 2021-02-10 MED ORDER — TAMSULOSIN HCL 0.4 MG PO CAPS
0.4000 mg | ORAL_CAPSULE | Freq: Every day | ORAL | Status: DC
  Administered 2021-02-10 – 2021-02-11 (×2): 0.4 mg via ORAL
  Filled 2021-02-10 (×2): qty 1

## 2021-02-10 MED ORDER — TRAMADOL-ACETAMINOPHEN 37.5-325 MG PO TABS: 1.0000 | ORAL_TABLET | Freq: Four times a day (QID) | ORAL | Status: DC | PRN

## 2021-02-10 MED ORDER — MELATONIN 3 MG PO TABS
3.0000 mg | ORAL_TABLET | Freq: Every day | ORAL | Status: DC
  Administered 2021-02-10 – 2021-02-11 (×2): 3 mg via ORAL
  Filled 2021-02-10 (×2): qty 1

## 2021-02-10 MED ORDER — LISINOPRIL 20 MG PO TABS
20.0000 mg | ORAL_TABLET | Freq: Every day | ORAL | Status: DC
  Administered 2021-02-10: 20 mg via ORAL
  Filled 2021-02-10: qty 2

## 2021-02-10 MED ORDER — AMLODIPINE BESYLATE 5 MG PO TABS
5.0000 mg | ORAL_TABLET | Freq: Every day | ORAL | Status: DC
  Filled 2021-02-10: qty 1

## 2021-02-10 MED ORDER — CARVEDILOL 3.125 MG PO TABS
3.1250 mg | ORAL_TABLET | Freq: Two times a day (BID) | ORAL | Status: DC
  Administered 2021-02-12: 3.125 mg via ORAL
  Filled 2021-02-10 (×3): qty 1

## 2021-02-10 NOTE — Progress Notes (Signed)
  Echocardiogram 2D Echocardiogram has been performed.  Perry Rosales 02/10/2021, 3:40 PM

## 2021-02-10 NOTE — Progress Notes (Signed)
Bilateral lower extremity venous duplex has been completed. Preliminary results can be found in CV Proc through chart review.   02/10/21 2:21 PM Perry Rosales RVT

## 2021-02-10 NOTE — ED Notes (Signed)
Ambulated patient down the hallway and gave patient some oatmeal to eat.

## 2021-02-10 NOTE — H&P (Signed)
History and Physical    [name] [id number] DOB: August 09, 1935 DOA: 02/09/2021  PCP: Levin Erp, MD (Confirm with patient/family/NH records and if not entered, this has to be entered at [facility] point of entry) Patient coming from: Home  I have personally briefly reviewed patient's old medical records in [place]  Chief Complaint: Penis pain  HPI: Perry Rosales is a 85 y.o. male with medical history significant of BPH, HTN, GERD, presented with increasing dysuria and penis pain.  Patient has had BPH for for 5 years taking Flomax at bedtime.  For last 6 to 7 days, patient developed worsening of his urine problems, with urinary hesitancy, " had to strain each time, and only a few drops came out" and increasing he developed a penis pain, went to see his PCP who ordered Keflex.  Despite taking Keflex for the last 5 days, patient continued to have worsening of urinary symptoms and penis pain, no fever or chills no diarrhea, no back pain.Marland Kitchen  PCP referred him to see urologist next Monday.  For last 2 to 3 weeks, patient was developed bilateral lower extremity swelling, denies any recent travels, no chest pain shortness of breath.  Went to see his PCP, who diagnosed him with deficiency.  ED Course: Patient was found to have urinary retention along with AKI creatinine 3.4, Foley was placed in, renal ultrasound showed no significant renal or ureteral stones.  Foley was placed in the ED.  Review of Systems: As per HPI otherwise 14 point review of systems negative.   Past Medical History:  Diagnosis Date   GERD (gastroesophageal reflux disease)    Heart murmur    Hypertension     Past Surgical History:  Procedure Laterality Date   CATARACT EXTRACTION W/ INTRAOCULAR LENS  IMPLANT, BILATERAL     CHOLECYSTECTOMY     COLONOSCOPY       reports that he has been smoking cigarettes. He has a 30.00 pack-year smoking history. He has been exposed to tobacco smoke. He has never used smokeless  tobacco. He reports that he does not drink alcohol and does not use drugs.  No Known Allergies  Family History  Problem Relation Age of Onset   Colon cancer Neg Hx    Esophageal cancer Neg Hx    Rectal cancer Neg Hx    Stomach cancer Neg Hx      Prior to Admission medications   Medication Sig Start Date End Date Taking? Authorizing Provider  amitriptyline (ELAVIL) 50 MG tablet Take 50 mg by mouth at bedtime.   Yes [provider]  aspirin 81 MG tablet Take 81 mg by mouth daily.   Yes [provider]  furosemide (LASIX) 20 MG tablet Take 20 mg by mouth daily as needed. 01/05/21  Yes [provider]  lisinopril (ZESTRIL) 10 MG tablet Take 10 mg by mouth daily. 09/30/20  Yes [provider]  omeprazole (PRILOSEC) 20 MG capsule Take 20 mg by mouth daily.   Yes [provider]  tamsulosin (FLOMAX) 0.4 MG CAPS capsule Take 0.4 mg by mouth in the morning and at bedtime.   Yes [provider]  traMADol-acetaminophen (ULTRACET) 37.5-325 MG per tablet Take 1 tablet by mouth See admin instructions. 1 tablet in the morning, 2 tablets at lunch, and 2 tablets at dinner.   Yes [provider]  cephALEXin (KEFLEX) 500 MG capsule Take 1 capsule (500 mg total) by mouth 4 (four) times daily. Patient not taking: Reported on  02/10/2021 02/04/21   Blanchie Dessert, MD    Physical Exam: Vitals:   02/10/21 0600 02/10/21 0849 02/10/21 0852 02/10/21 1151  BP: (!) 184/79 (!) 159/79 (!) 159/79 (!) 179/72  Pulse: 83  82 82  Resp: 17  15   Temp:   99 F (37.2 C) 98.3 F (36.8 C)  TempSrc:   Oral Oral  SpO2: 99%  100% 99%  Weight:    71.8 kg  Height:    '5\' 11"'$  (1.803 m)    Constitutional: NAD, calm, comfortable Vitals:   02/10/21 0600 02/10/21 0849 02/10/21 0852 02/10/21 1151  BP: (!) 184/79 (!) 159/79 (!) 159/79 (!) 179/72  Pulse: 83  82 82  Resp: 17  15   Temp:   99 F (37.2 C) 98.3 F (36.8 C)  TempSrc:   Oral Oral  SpO2: 99%  100% 99%   Weight:    71.8 kg  Height:    '5\' 11"'$  (1.803 m)   Eyes: PERRL, lids and conjunctivae normal ENMT: Mucous membranes are moist. Posterior pharynx clear of any exudate or lesions.Normal dentition.  Neck: normal, supple, no masses, no thyromegaly Respiratory: clear to auscultation bilaterally, no wheezing, no crackles. Normal respiratory effort. No accessory muscle use.  Cardiovascular: Regular rate and rhythm, no murmurs / rubs / gallops. 2+ extremity edema. 2+ pedal pulses. No carotid bruits.  Abdomen: mild tenderness on suprapubic area, no rebound no guarding, no masses palpated. No hepatosplenomegaly. Bowel sounds positive.  Musculoskeletal: no clubbing / cyanosis. No joint deformity upper and lower extremities. Good ROM, no contractures. Normal muscle tone.  Skin: no rashes, lesions, ulcers. No induration Neurologic: CN 2-12 grossly intact. Sensation intact, DTR normal. Strength 5/5 in all 4.  Psychiatric: Normal judgment and insight. Alert and oriented x 3. Normal mood.     Labs on Admission: I have personally reviewed following labs and imaging studies  CBC: Recent Labs  Lab 02/09/21 0921  WBC 11.1*  NEUTROABS 8.9*  HGB 13.7  HCT 39.9  MCV 89.3  PLT 0000000   Basic Metabolic Panel: Recent Labs  Lab 02/09/21 0921 02/09/21 1741 02/09/21 1813 02/10/21 0833  NA 134*  --  137 137  K 4.2  --  3.7 3.7  CL 97*  --  103 101  CO2 22  --  23 27  GLUCOSE 110*  --  125* 121*  BUN 58*  --  48* 43*  CREATININE 3.45*  --  2.64* 1.84*  CALCIUM 8.3*  --  8.0* 8.6*  MG 0.6* 1.4*  --  1.4*   GFR: Estimated Creatinine Clearance: 30.9 mL/min (A) (by C-G formula based on SCr of 1.84 mg/dL (H)). Liver Function Tests: Recent Labs  Lab 02/09/21 0921  AST 17  ALT 15  ALKPHOS 65  BILITOT 0.5  PROT 7.4  ALBUMIN 4.0   No results for input(s): LIPASE, AMYLASE in the last 168 hours. No results for input(s): AMMONIA in the last 168 hours. Coagulation Profile: No results for input(s):  INR, PROTIME in the last 168 hours. Cardiac Enzymes: No results for input(s): CKTOTAL, CKMB, CKMBINDEX, TROPONINI in the last 168 hours. BNP (last 3 results) No results for input(s): PROBNP in the last 8760 hours. HbA1C: No results for input(s): HGBA1C in the last 72 hours. CBG: No results for input(s): GLUCAP in the last 168 hours. Lipid Profile: No results for input(s): CHOL, HDL, LDLCALC, TRIG, CHOLHDL, LDLDIRECT in the last 72 hours. Thyroid Function Tests: No results for input(s): TSH, T4TOTAL, FREET4, T3FREE,  THYROIDAB in the last 72 hours. Anemia Panel: No results for input(s): VITAMINB12, FOLATE, FERRITIN, TIBC, IRON, RETICCTPCT in the last 72 hours. Urine analysis:    Component Value Date/Time   COLORURINE ORANGE (A) 02/09/2021 0930   APPEARANCEUR HAZY (A) 02/09/2021 0930   LABSPEC 1.020 02/09/2021 0930   PHURINE 6.0 02/09/2021 0930   GLUCOSEU NEGATIVE 02/09/2021 0930   HGBUR SMALL (A) 02/09/2021 0930   BILIRUBINUR MODERATE (A) 02/09/2021 0930   KETONESUR NEGATIVE 02/09/2021 0930   PROTEINUR 30 (A) 02/09/2021 0930   NITRITE POSITIVE (A) 02/09/2021 0930   LEUKOCYTESUR LARGE (A) 02/09/2021 0930    Radiological Exams on Admission: CT Renal Stone Study  Result Date: 02/09/2021 CLINICAL DATA:  Urinary retention.  UTI. EXAM: CT ABDOMEN AND PELVIS WITHOUT CONTRAST TECHNIQUE: Multidetector CT imaging of the abdomen and pelvis was performed following the standard protocol without IV contrast. COMPARISON:  None. FINDINGS: Lower chest: Three-vessel coronary artery disease. No other abnormalities. Hepatobiliary: No focal liver abnormality is seen. Status post cholecystectomy. No biliary dilatation. Pancreas: Unremarkable. No pancreatic ductal dilatation or surrounding inflammatory changes. Spleen: Normal in size without focal abnormality. Adrenals/Urinary Tract: Adrenal glands are normal. No renal stones. There is a cyst in the upper pole the right kidney. No suspicious masses. No  suspicious perinephric stranding or hydronephrosis. The left ureter is normal with no stones or dilatation. 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 The right ureters are mildly prominent compared to the left. However, no ureteral stones are noted. The bladder is decompressed with a Foley catheter. Stomach/Bowel: The bladder is diffusely thick walled in appearance. The small bowel is normal. Colonic diverticulosis is seen without diverticulitis. The remainder of the colon is normal. The appendix is normal. Vascular/Lymphatic: Calcified atherosclerosis is seen in the abdominal aorta. The infrarenal abdominal aorta measures up to 3.1 cm. Calcified atherosclerosis extends into the iliac and femoral vessels. No adenopathy. Reproductive: The prostate appears prominent. Other: No free air free fluid.  Fat containing umbilical hernia. Musculoskeletal: Age indeterminate compression fracture of T11 with approximately 50% loss of vertebral body height. IMPRESSION: 1. No renal or ureteral stones identified. 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 However, there are no stones seen along the course of the right ureters. The bladder is decompressed with a Foley catheter. 2. The stomach appears diffusely thick walled. Whether this is a true finding or due to decompression is unclear. Recommend upper GI or direct visualization. 3. Three-vessel coronary artery disease. 4. Colonic diverticulosis without diverticulitis. 5. Calcified atherosclerosis in the abdominal aorta. 6. The infrarenal abdominal aorta measures 3.1 cm. Recommend follow-up ultrasound every 3 years. This recommendation follows ACR consensus guidelines: White Paper of the ACR Incidental Findings Committee II on Vascular Findings. J Am Coll Radiol 2013; 10:789-794. 7. Age-indeterminate compression fracture of T11 with 50% loss of vertebral body  height. Recommend clinical correlation. Electronically Signed   By: Dorise Bullion III M.D.   On: 02/09/2021 11:28    EKG: Independently reviewed.  Sinus, borderline prolongation QTC  Assessment/Plan Active Problems:   AKI (acute kidney injury) ([place])  (please populate well all problems here in Problem List. (For example, if patient is on BP meds at home and you resume or decide to hold them, it is a problem that needs to be her. Same for CAD, COPD, HLD and so on)  Acute urinary retention -Continue Foley, discussed with on-call  urologist Dr. Gilford Rile, recommend maintain Foley for at least 7 days, and recommend patient set up appointment with urology next week.  Complicated UTI -Urine culture 5 days ago showed pansensitive E. coli, discussed with pharmacy, de-escalate antibiotics to Ancef for now, if kidney function continues to improve, likely can go home tomorrow with p.o. antibiotics until seen by urology.  AKI -Likely postrenal from urinary obstruction from BPH severe, improving after Foley overnight.  Will not increase IV Foley for now given severe peripheral edema.  But encourage more p.o. intake.  Renal ultrasound reassuring.  Worsening of peripheral edema -DVT study pending -BNP borderline elevated, patient denied any shortness of breath with exertion, check echocardiogram to rule out right-sided heart failure.  HTN -Hold ACEI -Start Coreg.  CAD -Incidental finding of three-vessel coronary disease, recommend outpatient cardiology follow-up.  Incidental finding of infrarenal abdominal aorta aneurysm -3.1 cm, recommend ultrasound every 3 years.  DVT prophylaxis: Heparin subcu Code Status: Full code Family Communication: None at bedside Disposition Plan: Expect less than 2 midnight hospital stay Consults called: urology Admission status: MedSurg observation   Lequita Halt MD [facility] Pager [phone]  02/10/2021, 2:39 PM

## 2021-02-10 NOTE — ED Notes (Signed)
Report called to Nickola Major, RN, 6E at [facility].

## 2021-02-10 NOTE — ED Notes (Signed)
Up to [place] with assistance.

## 2021-02-10 NOTE — ED Notes (Signed)
Report called to [facility] with [facility].

## 2021-02-11 DIAGNOSIS — N10 Acute tubulo-interstitial nephritis: Secondary | ICD-10-CM | POA: Diagnosis not present

## 2021-02-11 DIAGNOSIS — Z7982 Long term (current) use of aspirin: Secondary | ICD-10-CM | POA: Diagnosis not present

## 2021-02-11 DIAGNOSIS — N179 Acute kidney failure, unspecified: Secondary | ICD-10-CM | POA: Diagnosis present

## 2021-02-11 DIAGNOSIS — Z79899 Other long term (current) drug therapy: Secondary | ICD-10-CM | POA: Diagnosis not present

## 2021-02-11 DIAGNOSIS — N390 Urinary tract infection, site not specified: Secondary | ICD-10-CM | POA: Diagnosis present

## 2021-02-11 DIAGNOSIS — Z9049 Acquired absence of other specified parts of digestive tract: Secondary | ICD-10-CM | POA: Diagnosis not present

## 2021-02-11 DIAGNOSIS — N401 Enlarged prostate with lower urinary tract symptoms: Secondary | ICD-10-CM | POA: Diagnosis present

## 2021-02-11 DIAGNOSIS — R338 Other retention of urine: Secondary | ICD-10-CM | POA: Diagnosis not present

## 2021-02-11 DIAGNOSIS — I714 Abdominal aortic aneurysm, without rupture: Secondary | ICD-10-CM | POA: Diagnosis present

## 2021-02-11 DIAGNOSIS — B9620 Unspecified Escherichia coli [E. coli] as the cause of diseases classified elsewhere: Secondary | ICD-10-CM | POA: Diagnosis present

## 2021-02-11 DIAGNOSIS — N138 Other obstructive and reflux uropathy: Secondary | ICD-10-CM | POA: Diagnosis present

## 2021-02-11 DIAGNOSIS — F17210 Nicotine dependence, cigarettes, uncomplicated: Secondary | ICD-10-CM | POA: Diagnosis present

## 2021-02-11 DIAGNOSIS — I10 Essential (primary) hypertension: Secondary | ICD-10-CM | POA: Diagnosis present

## 2021-02-11 DIAGNOSIS — Z20822 Contact with and (suspected) exposure to covid-19: Secondary | ICD-10-CM | POA: Diagnosis present

## 2021-02-11 DIAGNOSIS — I2510 Atherosclerotic heart disease of native coronary artery without angina pectoris: Secondary | ICD-10-CM | POA: Diagnosis present

## 2021-02-11 MED ORDER — SODIUM CHLORIDE 0.9 % IV SOLN: INTRAVENOUS | Status: AC

## 2021-02-11 MED ORDER — SODIUM CHLORIDE 0.9 % IV SOLN: INTRAVENOUS | Status: DC

## 2021-02-11 MED ORDER — DUTASTERIDE 0.5 MG PO CAPS
0.5000 mg | ORAL_CAPSULE | Freq: Every day | ORAL | Status: DC
  Administered 2021-02-11 – 2021-02-12 (×2): 0.5 mg via ORAL
  Filled 2021-02-11 (×2): qty 1

## 2021-02-11 MED ORDER — CEFAZOLIN SODIUM-DEXTROSE 1-4 GM/50ML-% IV SOLN
1.0000 g | Freq: Three times a day (TID) | INTRAVENOUS | Status: DC
  Administered 2021-02-11 – 2021-02-12 (×3): 1 g via INTRAVENOUS
  Filled 2021-02-11 (×5): qty 50

## 2021-02-11 NOTE — Progress Notes (Signed)
[facility] PROGRESS NOTE    Progress Note  Perry Rosales  [id number] DOB: March 18, 1936 DOA: 02/09/2021 PCP: Levin Erp, MD     Brief Narrative:   Perry Rosales is an 85 y.o. male past medical history significant for BPH, essential hypertension comes in with increased dysuria and penis pain he has been developing urinary problems for the last week with urinary hesitancy went to his PCP who started him on Keflex, he is also developed for the last 3 weeks lower extremity swelling   Assessment/Plan:   AKI (acute kidney injury) ([place]) likely due to to obstructive uropathy/BPH: Foley was inserted, and he had a urine output of about 5 L an hour. He continues to put significant amount of urine. On admission his creatinine is 2.6, unknown baseline after Foley placement his creatinine now is 1.8. Due to his large amount of urine output we will go ahead and start him on gentle IV fluid hydration and recheck a basic metabolic panel in the morning. Cont flomax , will add avodart  Complicated UTI: Urine culture from 5 days ago showed pansensitive E. coli continue Ancef for 2 more days.  Worsening lower extremity edema: Likely due to acute urinary retention. Lower extremity DVT was negative. 2D echo showed preserved EF grade 1 diastolic heart failure  Essential hypertension: Hold ACE inhibitor. Continue Coreg.  Continue to monitor blood pressure slowly trending up.  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  DVT prophylaxis: lovenox Family Communication:noen Status is: Observation  The patient will require care spanning > 2 midnights and should be moved to inpatient because: Hemodynamically unstable  Dispo: The patient is from: Home              Anticipated d/c is to: Home              Patient currently is not medically stable to d/c.   Difficult to place patient No        Code Status:     Code Status Orders  (From admission, onward)            Start     Ordered   02/10/21 1256  Full code  Continuous        02/10/21 1256           Code Status History     This patient has a current code status but no historical code status.         IV Access:   Peripheral IV   Procedures and diagnostic studies:   ECHOCARDIOGRAM COMPLETE  Result Date: 02/10/2021    ECHOCARDIOGRAM REPORT   Patient Name:   Perry Rosales Date of Exam: 02/10/2021 Medical Rec #:  [id number]      Height:       71.0 in Accession #:    [id number]     Weight:       158.3 lb Date of Birth:  [birth year]      BSA:          1.909 m Patient Age:    85 years       BP:           179/72 mmHg Patient Gender: M              HR:           91 bpm. Exam Location:  Inpatient Procedure: 2D Echo, Cardiac Doppler, Color Doppler and [place] Contrast Bubble  Study Indications:    I50.40* Unspecified combined systolic (congestive) and diastolic                 (congestive) heart failure  History:        Patient has no prior history of Echocardiogram examinations.  Sonographer:    Roseanna Rainbow RDCS Referring Phys: [id number] [place] Comments: Technically difficult study due to poor echo windows. No prior cardiac history. IMPRESSIONS  1. Left ventricular ejection fraction, by estimation, is 60 to 65%. The left ventricle has normal function. The left ventricle has no regional wall motion abnormalities. There is mild concentric left ventricular hypertrophy. Left ventricular diastolic parameters are consistent with Grade I diastolic dysfunction (impaired relaxation).  2. Right ventricular systolic function is normal. The right ventricular size is normal. Tricuspid regurgitation signal is inadequate for assessing PA pressure.  3. The mitral valve is grossly normal. No evidence of mitral valve regurgitation.  4. The aortic valve is calcified. Aortic valve regurgitation is not visualized. Mild to moderate aortic valve sclerosis/calcification is present, without any  evidence of aortic stenosis.  5. The inferior vena cava is normal in size with greater than 50% respiratory variability, suggesting right atrial pressure of 3 mmHg. Comparison(s): No prior Echocardiogram. FINDINGS  Left Ventricle: Left ventricular ejection fraction, by estimation, is 60 to 65%. The left ventricle has normal function. The left ventricle has no regional wall motion abnormalities. Definity contrast agent was given IV to delineate the left ventricular  endocardial borders. The left ventricular internal cavity size was small. There is mild concentric left ventricular hypertrophy. Left ventricular diastolic parameters are consistent with Grade I diastolic dysfunction (impaired relaxation). Right Ventricle: The right ventricular size is normal. No increase in right ventricular wall thickness. Right ventricular systolic function is normal. Tricuspid regurgitation signal is inadequate for assessing PA pressure. Left Atrium: Left atrial size was normal in size. Right Atrium: Right atrial size was normal in size. Pericardium: There is no evidence of pericardial effusion. Mitral Valve: The mitral valve is grossly normal. No evidence of mitral valve regurgitation. Tricuspid Valve: The tricuspid valve is normal in structure. Tricuspid valve regurgitation is trivial. Aortic Valve: The aortic valve is calcified. Aortic valve regurgitation is not visualized. Mild to moderate aortic valve sclerosis/calcification is present, without any evidence of aortic stenosis. Aortic valve mean gradient measures 8.5 mmHg. Aortic valve peak gradient measures 15.1 mmHg. Aortic valve area, by VTI measures 1.89 cm. Pulmonic Valve: The pulmonic valve was grossly normal. Pulmonic valve regurgitation is not visualized. No evidence of pulmonic stenosis. Aorta: The aortic root and ascending aorta are structurally normal, with no evidence of dilitation. Venous: The inferior vena cava is normal in size with greater than 50% respiratory  variability, suggesting right atrial pressure of 3 mmHg. IAS/Shunts: The atrial septum is grossly normal.  LEFT VENTRICLE PLAX 2D LVIDd:         3.50 cm     Diastology LVIDs:         2.40 cm     LV e' medial:    4.79 cm/s LV PW:         1.20 cm     LV E/e' medial:  8.7 LV IVS:        1.30 cm     LV e' lateral:   6.09 cm/s LVOT diam:     2.00 cm     LV E/e' lateral: 6.8 LV SV:         68 LV  SV Index:   35 LVOT Area:     3.14 cm  LV Volumes (MOD) LV vol d, MOD A2C: 70.1 ml LV vol d, MOD A4C: 64.7 ml LV vol s, MOD A2C: 28.5 ml LV vol s, MOD A4C: 26.4 ml LV SV MOD A2C:     41.6 ml LV SV MOD A4C:     64.7 ml LV SV MOD BP:      41.5 ml RIGHT VENTRICLE             IVC RV S prime:     15.60 cm/s  IVC diam: 2.00 cm TAPSE (M-mode): 2.2 cm LEFT ATRIUM             Index       RIGHT ATRIUM           Index LA diam:        3.40 cm 1.78 cm/m  RA Area:     11.70 cm LA Vol (A2C):   32.6 ml 17.08 ml/m RA Volume:   26.40 ml  13.83 ml/m LA Vol (A4C):   19.6 ml 10.27 ml/m LA Biplane Vol: 25.3 ml 13.25 ml/m  AORTIC VALVE AV Area (Vmax):    1.66 cm AV Area (Vmean):   1.80 cm AV Area (VTI):     1.89 cm AV Vmax:           194.50 cm/s AV Vmean:          140.000 cm/s AV VTI:            0.357 m AV Peak Grad:      15.1 mmHg AV Mean Grad:      8.5 mmHg LVOT Vmax:         103.00 cm/s LVOT Vmean:        80.000 cm/s LVOT VTI:          0.215 m LVOT/AV VTI ratio: 0.60  AORTA Ao Root diam: 3.00 cm Ao Asc diam:  3.30 cm MITRAL VALVE MV Area (PHT): 4.06 cm    SHUNTS MV Decel Time: 187 msec    Systemic VTI:  0.22 m MV E velocity: 41.60 cm/s  Systemic Diam: 2.00 cm MV A velocity: 86.10 cm/s MV E/A ratio:  0.48 Rudean Haskell MD Electronically signed by Rudean Haskell MD Signature Date/Time: 02/10/2021/4:25:49 PM    Final    CT Renal Stone Study  Result Date: 02/09/2021 CLINICAL DATA:  Urinary retention.  UTI. EXAM: CT ABDOMEN AND PELVIS WITHOUT CONTRAST TECHNIQUE: Multidetector CT imaging of the abdomen and pelvis was performed  following the standard protocol without IV contrast. COMPARISON:  None. FINDINGS: Lower chest: Three-vessel coronary artery disease. No other abnormalities. Hepatobiliary: No focal liver abnormality is seen. Status post cholecystectomy. No biliary dilatation. Pancreas: Unremarkable. No pancreatic ductal dilatation or surrounding inflammatory changes. Spleen: Normal in size without focal abnormality. Adrenals/Urinary Tract: Adrenal glands are normal. No renal stones. There is a cyst in the upper pole the right kidney. No suspicious masses. No suspicious perinephric stranding or hydronephrosis. The left ureter is normal with no stones or dilatation. 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 The right ureters are mildly prominent compared to the left. However, no ureteral stones are noted. The bladder is decompressed with a Foley catheter. Stomach/Bowel: The bladder is diffusely thick walled in appearance. The small bowel is normal. Colonic diverticulosis is seen without diverticulitis. The remainder of the colon is normal. The appendix is normal. Vascular/Lymphatic: Calcified atherosclerosis is  seen in the abdominal aorta. The infrarenal abdominal aorta measures up to 3.1 cm. Calcified atherosclerosis extends into the iliac and femoral vessels. No adenopathy. Reproductive: The prostate appears prominent. Other: No free air free fluid.  Fat containing umbilical hernia. Musculoskeletal: Age indeterminate compression fracture of T11 with approximately 50% loss of vertebral body height. IMPRESSION: 1. No renal or ureteral stones identified. 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 However, there are no stones seen along the course of the right ureters. The bladder is decompressed with a Foley catheter. 2. The stomach appears diffusely thick walled. Whether this is a true finding or due to decompression  is unclear. Recommend upper GI or direct visualization. 3. Three-vessel coronary artery disease. 4. Colonic diverticulosis without diverticulitis. 5. Calcified atherosclerosis in the abdominal aorta. 6. The infrarenal abdominal aorta measures 3.1 cm. Recommend follow-up ultrasound every 3 years. This recommendation follows ACR consensus guidelines: White Paper of the ACR Incidental Findings Committee II on Vascular Findings. J Am Coll Radiol 2013; 10:789-794. 7. Age-indeterminate compression fracture of T11 with 50% loss of vertebral body height. Recommend clinical correlation. Electronically Signed   By: Dorise Bullion III M.D.   On: 02/09/2021 11:28   VAS [place] LOWER EXTREMITY VENOUS (DVT)  Result Date: 02/10/2021  Lower Venous DVT Study Patient Name:  MAEJOR ANDREONI  Date of Exam:   02/10/2021 Medical Rec #: [id number]       Accession #:    [id number] Date of Birth: 12-18-1935       Patient Gender: M Patient Age:   32 years Exam Location:  [facility] Procedure:      VAS [place] LOWER EXTREMITY VENOUS (DVT) Referring Phys: Wynetta Fines --------------------------------------------------------------------------------  Indications: Edema.  Risk Factors: None identified. Comparison Study: No prior studies. Performing Technologist: Oliver Hum RVT  Examination Guidelines: A complete evaluation includes B-mode imaging, spectral Doppler, color Doppler, and power Doppler as needed of all accessible portions of each vessel. Bilateral testing is considered an integral part of a complete examination. Limited examinations for reoccurring indications may be performed as noted. The reflux portion of the exam is performed with the patient in reverse Trendelenburg.  +---------+---------------+---------+-----------+----------+--------------+ RIGHT    CompressibilityPhasicitySpontaneityPropertiesThrombus Aging +---------+---------------+---------+-----------+----------+--------------+ CFV      Full           Yes       Yes                                 +---------+---------------+---------+-----------+----------+--------------+ SFJ      Full                                                        +---------+---------------+---------+-----------+----------+--------------+ FV Prox  Full                                                        +---------+---------------+---------+-----------+----------+--------------+ FV Mid   Full                                                        +---------+---------------+---------+-----------+----------+--------------+  FV DistalFull                                                        +---------+---------------+---------+-----------+----------+--------------+ PFV      Full                                                        +---------+---------------+---------+-----------+----------+--------------+ POP      Full           Yes      Yes                                 +---------+---------------+---------+-----------+----------+--------------+ PTV      Full                                                        +---------+---------------+---------+-----------+----------+--------------+ PERO     Full                                                        +---------+---------------+---------+-----------+----------+--------------+   +---------+---------------+---------+-----------+----------+--------------+ LEFT     CompressibilityPhasicitySpontaneityPropertiesThrombus Aging +---------+---------------+---------+-----------+----------+--------------+ CFV      Full           Yes      Yes                                 +---------+---------------+---------+-----------+----------+--------------+ SFJ      Full                                                        +---------+---------------+---------+-----------+----------+--------------+ FV Prox  Full                                                         +---------+---------------+---------+-----------+----------+--------------+ FV Mid   Full                                                        +---------+---------------+---------+-----------+----------+--------------+ FV DistalFull                                                        +---------+---------------+---------+-----------+----------+--------------+   PFV      Full                                                        +---------+---------------+---------+-----------+----------+--------------+ POP      Full           Yes      Yes                                 +---------+---------------+---------+-----------+----------+--------------+ PTV      Full                                                        +---------+---------------+---------+-----------+----------+--------------+ PERO     Full                                                        +---------+---------------+---------+-----------+----------+--------------+     Summary: RIGHT: - There is no evidence of deep vein thrombosis in the lower extremity.  - No cystic structure found in the popliteal fossa.  LEFT: - There is no evidence of deep vein thrombosis in the lower extremity.  - No cystic structure found in the popliteal fossa.  *See table(s) above for measurements and observations. Electronically signed by Monica Martinez MD on 02/10/2021 at 4:11:13 PM.    Final      Medical Consultants:   None.   Subjective:    VAUN SHELDON no complaints hungry  Objective:    Vitals:   02/10/21 1811 02/10/21 1955 02/11/21 0024 02/11/21 0445  BP: (!) 174/77 (!) 161/60 (!) 141/66 (!) 153/66  Pulse: 81 96 77 85  Resp:  '17 18 17  '$ Temp:  98.2 F (36.8 C) (!) 97.4 F (36.3 C) 97.9 F (36.6 C)  TempSrc:  Oral Oral Oral  SpO2:  94% 92% 97%  Weight:      Height:       SpO2: 97 %   Intake/Output Summary (Last 24 hours) at 02/11/2021 0735 Last data filed at 02/11/2021 0648 Gross per 24 hour   Intake 49.2 ml  Output 1400 ml  Net -1350.8 ml   Filed Weights   02/10/21 1151  Weight: 71.8 kg    Exam: General exam: In no acute distress. Respiratory system: Good air movement and clear to auscultation. Cardiovascular system: S1 & S2 heard, RRR. No JVD. Gastrointestinal system: Abdomen is nondistended, soft and nontender.  Extremities: No pedal edema. Skin: No rashes, lesions or ulcers Psychiatry: Judgement and insight appear normal. Mood & affect appropriate.    Data Reviewed:    Labs: Basic Metabolic Panel: Recent Labs  Lab 02/09/21 0921 02/09/21 1741 02/09/21 1813 02/10/21 0833  NA 134*  --  137 137  K 4.2  --  3.7 3.7  CL 97*  --  103 101  CO2 22  --  23 27  GLUCOSE 110*  --  125* 121*  BUN 58*  --  48*  43*  CREATININE 3.45*  --  2.64* 1.84*  CALCIUM 8.3*  --  8.0* 8.6*  MG 0.6* 1.4*  --  1.4*   GFR Estimated Creatinine Clearance: 30.9 mL/min (A) (by C-G formula based on SCr of 1.84 mg/dL (H)). Liver Function Tests: Recent Labs  Lab 02/09/21 0921  AST 17  ALT 15  ALKPHOS 65  BILITOT 0.5  PROT 7.4  ALBUMIN 4.0   No results for input(s): LIPASE, AMYLASE in the last 168 hours. No results for input(s): AMMONIA in the last 168 hours. Coagulation profile No results for input(s): INR, PROTIME in the last 168 hours. COVID-19 Labs  No results for input(s): DDIMER, FERRITIN, LDH, CRP in the last 72 hours.  Lab Results  Component Value Date   [place] NEGATIVE 02/09/2021    CBC: Recent Labs  Lab 02/09/21 0921  WBC 11.1*  NEUTROABS 8.9*  HGB 13.7  HCT 39.9  MCV 89.3  PLT 160   Cardiac Enzymes: No results for input(s): CKTOTAL, CKMB, CKMBINDEX, TROPONINI in the last 168 hours. BNP (last 3 results) No results for input(s): PROBNP in the last 8760 hours. CBG: No results for input(s): GLUCAP in the last 168 hours. D-Dimer: No results for input(s): DDIMER in the last 72 hours. Hgb A1c: No results for input(s): HGBA1C in the last 72  hours. Lipid Profile: No results for input(s): CHOL, HDL, LDLCALC, TRIG, CHOLHDL, LDLDIRECT in the last 72 hours. Thyroid function studies: No results for input(s): TSH, T4TOTAL, T3FREE, THYROIDAB in the last 72 hours.  Invalid input(s): FREET3 Anemia work up: No results for input(s): VITAMINB12, FOLATE, FERRITIN, TIBC, IRON, RETICCTPCT in the last 72 hours. Sepsis Labs: Recent Labs  Lab 02/09/21 0921  WBC 11.1*   Microbiology Recent Results (from the past 240 hour(s))  Urine Culture     Status: Abnormal   Collection Time: 02/04/21  8:19 PM   Specimen: Urine, Clean Catch  Result Value Ref Range Status   Specimen Description   Final    URINE, CLEAN CATCH Performed at [facility], [address] [place], [place] [address]    Special Requests   Final    NONE Performed at [facility], [address], [place], [place] [address]    Culture >=100,000 COLONIES/mL ESCHERICHIA COLI (A)  Final   Report Status 02/07/2021 FINAL  Final   Organism ID, Bacteria ESCHERICHIA COLI (A)  Final      Susceptibility   Escherichia coli - MIC*    AMPICILLIN 4 SENSITIVE Sensitive     CEFAZOLIN <=4 SENSITIVE Sensitive     CEFEPIME <=0.12 SENSITIVE Sensitive     CEFTRIAXONE <=0.25 SENSITIVE Sensitive     CIPROFLOXACIN <=0.25 SENSITIVE Sensitive     GENTAMICIN <=1 SENSITIVE Sensitive     IMIPENEM <=0.25 SENSITIVE Sensitive     NITROFURANTOIN <=16 SENSITIVE Sensitive     TRIMETH/SULFA <=20 SENSITIVE Sensitive     AMPICILLIN/SULBACTAM <=2 SENSITIVE Sensitive     PIP/TAZO <=4 SENSITIVE Sensitive     * >=100,000 COLONIES/mL ESCHERICHIA COLI  Urine Culture     Status: None   Collection Time: 02/09/21 10:35 AM   Specimen: Urine, Catheterized  Result Value Ref Range Status   Specimen Description   Final    URINE, CATHETERIZED Performed at [facility], [address], [place], [place] [address]    Special Requests   Final     NONE Performed at [facility], [address] [place], [place] [address]    Culture  Final    NO GROWTH Performed at [facility], [place] [address] [place] [address], [place], [place] [address]    Report Status 02/10/2021 FINAL  Final  Resp Panel by RT-PCR (Flu A&B, Covid) Nasopharyngeal Swab     Status: None   Collection Time: 02/09/21 11:24 AM   Specimen: Nasopharyngeal Swab; Nasopharyngeal(NP) swabs in vial transport medium  Result Value Ref Range Status   SARS Coronavirus 2 by RT PCR NEGATIVE NEGATIVE Final    Comment: (NOTE) SARS-CoV-2 target nucleic acids are NOT DETECTED.  The SARS-CoV-2 RNA is generally detectable in upper respiratory specimens during the acute phase of infection. The lowest concentration of SARS-CoV-2 viral copies this assay can detect is 138 copies/mL. A negative result does not preclude SARS-Cov-2 infection and should not be used as the sole basis for treatment or other patient management decisions. A negative result may occur with  improper specimen collection/handling, submission of specimen other than nasopharyngeal swab, presence of viral mutation(s) within the areas targeted by this assay, and inadequate number of viral copies(<138 copies/mL). A negative result must be combined with clinical observations, patient history, and epidemiological information. The expected result is Negative.  Fact Sheet for Patients:  EntrepreneurPulse.com.au  Fact Sheet for Healthcare Providers:  IncredibleEmployment.be  This test is no t yet approved or cleared by the [place] FDA and  has been authorized for detection and/or diagnosis of SARS-CoV-2 by FDA under an Emergency Use Authorization (EUA). This EUA will remain  in effect (meaning this test can be used) for the duration of the COVID-19 declaration under Section 564(b)(1) of the Act, 21 U.S.C.section 360bbb-3(b)(1), unless the authorization is terminated   or revoked sooner.       Influenza A by PCR NEGATIVE NEGATIVE Final   Influenza B by PCR NEGATIVE NEGATIVE Final    Comment: (NOTE) The Xpert Xpress SARS-CoV-2/FLU/RSV plus assay is intended as an aid in the diagnosis of influenza from Nasopharyngeal swab specimens and should not be used as a sole basis for treatment. Nasal washings and aspirates are unacceptable for Xpert Xpress SARS-CoV-2/FLU/RSV testing.  Fact Sheet for Patients: EntrepreneurPulse.com.au  Fact Sheet for Healthcare Providers: IncredibleEmployment.be  This test is not yet approved or cleared by the [place] FDA and has been authorized for detection and/or diagnosis of SARS-CoV-2 by FDA under an Emergency Use Authorization (EUA). This EUA will remain in effect (meaning this test can be used) for the duration of the COVID-19 declaration under Section 564(b)(1) of the Act, 21 U.S.C. section 360bbb-3(b)(1), unless the authorization is terminated or revoked.  Performed at [facility], [address] [place], [place] [address]      Medications:    amitriptyline  50 mg Oral QHS   aspirin EC  81 mg Oral Daily   carvedilol  3.125 mg Oral BID WC   Chlorhexidine Gluconate Cloth  6 each Topical Daily   heparin  5,000 Units Subcutaneous Q12H   melatonin  3 mg Oral QHS   tamsulosin  0.4 mg Oral QPC supper   Continuous Infusions:   ceFAZolin (ANCEF) IV Stopped (02/11/21 [id number])      LOS: 0 days   Charlynne Cousins  [facility]  02/11/2021, 7:35 AM

## 2021-02-12 LAB — BASIC METABOLIC PANEL
Anion gap: 9 (ref 5–15)
BUN: 26 mg/dL — ABNORMAL HIGH (ref 8–23)
CO2: 27 mmol/L (ref 22–32)
Calcium: 8.5 mg/dL — ABNORMAL LOW (ref 8.9–10.3)
Chloride: 104 mmol/L (ref 98–111)
Creatinine, Ser: 1.32 mg/dL — ABNORMAL HIGH (ref 0.61–1.24)
GFR, Estimated: 54 mL/min — ABNORMAL LOW (ref >60)
Glucose, Bld: 98 mg/dL (ref 70–99)
Potassium: 3.8 mmol/L (ref 3.5–5.1)
Sodium: 140 mmol/L (ref 135–145)

## 2021-02-12 LAB — MAGNESIUM: Magnesium: 1.2 mg/dL — ABNORMAL LOW (ref 1.7–2.4)

## 2021-02-12 MED ORDER — MAGNESIUM SULFATE 4 GM/100ML IV SOLN
4.0000 g | Freq: Once | INTRAVENOUS | Status: AC
  Administered 2021-02-12: 4 g via INTRAVENOUS
  Filled 2021-02-12: qty 100

## 2021-02-12 MED ORDER — AMLODIPINE BESYLATE 5 MG PO TABS: 5.0000 mg | ORAL_TABLET | Freq: Every day | ORAL | 11 refills | Status: DC

## 2021-02-12 NOTE — Discharge Summary (Addendum)
Physician Discharge Summary  Perry Rosales [id number] DOB: 06/09/1936 DOA: 02/09/2021  PCP: Levin Erp, MD  Admit date: 02/09/2021 Discharge date: 02/12/2021 30 Day Unplanned Readmission Risk Score    Flowsheet Row ED to Hosp-Admission (Current) from 02/09/2021 in [facility]  30 Day Unplanned Readmission Risk Score (%) 13.86 Filed at 02/12/2021 0801       This score is the patient's risk of an unplanned readmission within 30 days of being discharged (0 -100%). The score is based on dignosis, age, lab data, medications, orders, and past utilization.   Low:  0-14.9   Medium: 15-21.9   High: 22-29.9   Extreme: 30 and above          Admitted From: Home Disposition: Home  Recommendations for Outpatient Follow-up:  Follow up with PCP in 1-2 weeks Please obtain BMP/CBC in one week Follow-up with urology on Monday, 02/17/2021 Please follow up with your PCP on the following pending results: Unresulted Labs (From admission, onward)    None         Home Health: None Equipment/Devices: None  Discharge Condition: Stable CODE STATUS: Full code Diet recommendation: Cardiac  Subjective: Seen and examined.  No complaints.  He says that " I am ready to go home"/   Brief/Interim Summary: Perry Rosales is an 85 y.o. male past medical history significant for BPH, essential hypertension presented to ED with increased dysuria and penis pain he has been developing urinary problems for the last week with urinary hesitancy and went to his PCP who started him on Keflex which he was taking for past 5 days.  Upon arrival to ED, patient was hemodynamically stable however he was found to have AKI with creatinine of 3.4.  Indwelling Foley catheter was placed, he had significant urine output.  Admitted under hospital service due to AKI secondary to obstructive uropathy/PPI as well as complicated UTI.  He was started on IV Ancef here.  Started on IV fluids.  CT renal study showed no  renal or ureteral stones.  Patient's creatinine improved and currently 1.32.  He has no complaints and prefers to go home.  He was already taking Flomax which he needs to continue.  He already has scheduled follow-up with urology next Monday which was set up by his PCP.  He is being discharged in stable condition.  Due to his AKI, his Lasix and lisinopril were held here.  Since his creatinine is improving but not back to baseline, I have advised him to hold his lisinopril and furosemide for next few days until his renal function is repeated by his PCP and then go with his PCPs recommendations regarding those medications. Off note, magnesium was 1.2 today and pt received 4 gm magnesium before discharge.   Discharge Diagnoses:  Active Problems:   AKI (acute kidney injury) ([place])   Acute urinary retention    Discharge Instructions   Allergies as of 02/12/2021   No Known Allergies      Medication List     STOP taking these medications    cephALEXin 500 MG capsule Commonly known as: KEFLEX   furosemide 20 MG tablet Commonly known as: LASIX   lisinopril 10 MG tablet Commonly known as: ZESTRIL       TAKE these medications    amitriptyline 50 MG tablet Commonly known as: ELAVIL Take 50 mg by mouth at bedtime.   amLODipine 5 MG tablet Commonly known as: NORVASC Take 1 tablet (5 mg total) by mouth  daily.   aspirin 81 MG tablet Take 81 mg by mouth daily.   omeprazole 20 MG capsule Commonly known as: PRILOSEC Take 20 mg by mouth daily.   tamsulosin 0.4 MG Caps capsule Commonly known as: FLOMAX Take 0.4 mg by mouth in the morning and at bedtime.   traMADol-acetaminophen 37.5-325 MG tablet Commonly known as: ULTRACET Take 1 tablet by mouth See admin instructions. 1 tablet in the morning, 2 tablets at lunch, and 2 tablets at dinner.        Follow-up Information     [facility] .   Contact information: [place]        Levin Erp, MD Follow up in 1 week(s).   Specialty: Internal Medicine Contact information: [place], [address] [place] [address] [phone]                No Known Allergies  Consultations: None   Procedures/Studies: ECHOCARDIOGRAM COMPLETE  Result Date: 02/10/2021    ECHOCARDIOGRAM REPORT   Patient Name:   Perry Rosales Date of Exam: 02/10/2021 Medical Rec #:  [id number]      Height:       71.0 in Accession #:    [id number]     Weight:       158.3 lb Date of Birth:  1935/08/18      BSA:          1.909 m Patient Age:    23 years       BP:           179/72 mmHg Patient Gender: M              HR:           91 bpm. Exam Location:  Inpatient Procedure: 2D Echo, Cardiac Doppler, Color Doppler and [place] Contrast Bubble            Study Indications:    I50.40* Unspecified combined systolic (congestive) and diastolic                 (congestive) heart failure  History:        Patient has no prior history of Echocardiogram examinations.  Sonographer:    Roseanna Rainbow RDCS Referring Phys: [id number] [place] Comments: Technically difficult study due to poor echo windows. No prior cardiac history. IMPRESSIONS  1. Left ventricular ejection fraction, by estimation, is 60 to 65%. The left ventricle has normal function. The left ventricle has no regional wall motion abnormalities. There is mild concentric left ventricular hypertrophy. Left ventricular diastolic parameters are consistent with Grade I diastolic dysfunction (impaired relaxation).  2. Right ventricular systolic function is normal. The right ventricular size is normal. Tricuspid regurgitation signal is inadequate for assessing PA pressure.  3. The mitral valve is grossly normal. No evidence of mitral valve regurgitation.  4. The aortic valve is calcified. Aortic valve regurgitation is not visualized. Mild to moderate aortic valve sclerosis/calcification is present, without any evidence  of aortic stenosis.  5. The inferior vena cava is normal in size with greater than 50% respiratory variability, suggesting right atrial pressure of 3 mmHg. Comparison(s): No prior Echocardiogram. FINDINGS  Left Ventricle: Left ventricular ejection fraction, by estimation, is 60 to 65%. The left ventricle has normal function. The left ventricle has no regional wall motion abnormalities. Definity contrast agent was given IV to delineate the left ventricular  endocardial borders. The left ventricular  internal cavity size was small. There is mild concentric left ventricular hypertrophy. Left ventricular diastolic parameters are consistent with Grade I diastolic dysfunction (impaired relaxation). Right Ventricle: The right ventricular size is normal. No increase in right ventricular wall thickness. Right ventricular systolic function is normal. Tricuspid regurgitation signal is inadequate for assessing PA pressure. Left Atrium: Left atrial size was normal in size. Right Atrium: Right atrial size was normal in size. Pericardium: There is no evidence of pericardial effusion. Mitral Valve: The mitral valve is grossly normal. No evidence of mitral valve regurgitation. Tricuspid Valve: The tricuspid valve is normal in structure. Tricuspid valve regurgitation is trivial. Aortic Valve: The aortic valve is calcified. Aortic valve regurgitation is not visualized. Mild to moderate aortic valve sclerosis/calcification is present, without any evidence of aortic stenosis. Aortic valve mean gradient measures 8.5 mmHg. Aortic valve peak gradient measures 15.1 mmHg. Aortic valve area, by VTI measures 1.89 cm. Pulmonic Valve: The pulmonic valve was grossly normal. Pulmonic valve regurgitation is not visualized. No evidence of pulmonic stenosis. Aorta: The aortic root and ascending aorta are structurally normal, with no evidence of dilitation. Venous: The inferior vena cava is normal in size with greater than 50% respiratory variability,  suggesting right atrial pressure of 3 mmHg. IAS/Shunts: The atrial septum is grossly normal.  LEFT VENTRICLE PLAX 2D LVIDd:         3.50 cm     Diastology LVIDs:         2.40 cm     LV e' medial:    4.79 cm/s LV PW:         1.20 cm     LV E/e' medial:  8.7 LV IVS:        1.30 cm     LV e' lateral:   6.09 cm/s LVOT diam:     2.00 cm     LV E/e' lateral: 6.8 LV SV:         68 LV SV Index:   35 LVOT Area:     3.14 cm  LV Volumes (MOD) LV vol d, MOD A2C: 70.1 ml LV vol d, MOD A4C: 64.7 ml LV vol s, MOD A2C: 28.5 ml LV vol s, MOD A4C: 26.4 ml LV SV MOD A2C:     41.6 ml LV SV MOD A4C:     64.7 ml LV SV MOD BP:      41.5 ml RIGHT VENTRICLE             IVC RV S prime:     15.60 cm/s  IVC diam: 2.00 cm TAPSE (M-mode): 2.2 cm LEFT ATRIUM             Index       RIGHT ATRIUM           Index LA diam:        3.40 cm 1.78 cm/m  RA Area:     11.70 cm LA Vol (A2C):   32.6 ml 17.08 ml/m RA Volume:   26.40 ml  13.83 ml/m LA Vol (A4C):   19.6 ml 10.27 ml/m LA Biplane Vol: 25.3 ml 13.25 ml/m  AORTIC VALVE AV Area (Vmax):    1.66 cm AV Area (Vmean):   1.80 cm AV Area (VTI):     1.89 cm AV Vmax:           194.50 cm/s AV Vmean:          140.000 cm/s AV VTI:  0.357 m AV Peak Grad:      15.1 mmHg AV Mean Grad:      8.5 mmHg LVOT Vmax:         103.00 cm/s LVOT Vmean:        80.000 cm/s LVOT VTI:          0.215 m LVOT/AV VTI ratio: 0.60  AORTA Ao Root diam: 3.00 cm Ao Asc diam:  3.30 cm MITRAL VALVE MV Area (PHT): 4.06 cm    SHUNTS MV Decel Time: 187 msec    Systemic VTI:  0.22 m MV E velocity: 41.60 cm/s  Systemic Diam: 2.00 cm MV A velocity: 86.10 cm/s MV E/A ratio:  0.48 Rudean Haskell MD Electronically signed by Rudean Haskell MD Signature Date/Time: 02/10/2021/4:25:49 PM    Final    CT Renal Stone Study  Result Date: 02/09/2021 CLINICAL DATA:  Urinary retention.  UTI. EXAM: CT ABDOMEN AND PELVIS WITHOUT CONTRAST TECHNIQUE: Multidetector CT imaging of the abdomen and pelvis was performed following the  standard protocol without IV contrast. COMPARISON:  None. FINDINGS: Lower chest: Three-vessel coronary artery disease. No other abnormalities. Hepatobiliary: No focal liver abnormality is seen. Status post cholecystectomy. No biliary dilatation. Pancreas: Unremarkable. No pancreatic ductal dilatation or surrounding inflammatory changes. Spleen: Normal in size without focal abnormality. Adrenals/Urinary Tract: Adrenal glands are normal. No renal stones. There is a cyst in the upper pole the right kidney. No suspicious masses. No suspicious perinephric stranding or hydronephrosis. The left ureter is normal with no stones or dilatation. 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 The right ureters are mildly prominent compared to the left. However, no ureteral stones are noted. The bladder is decompressed with a Foley catheter. Stomach/Bowel: The bladder is diffusely thick walled in appearance. The small bowel is normal. Colonic diverticulosis is seen without diverticulitis. The remainder of the colon is normal. The appendix is normal. Vascular/Lymphatic: Calcified atherosclerosis is seen in the abdominal aorta. The infrarenal abdominal aorta measures up to 3.1 cm. Calcified atherosclerosis extends into the iliac and femoral vessels. No adenopathy. Reproductive: The prostate appears prominent. Other: No free air free fluid.  Fat containing umbilical hernia. Musculoskeletal: Age indeterminate compression fracture of T11 with approximately 50% loss of vertebral body height. IMPRESSION: 1. No renal or ureteral stones identified. 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 However, there are no stones seen along the course of the right ureters. The bladder is decompressed with a Foley catheter. 2. The stomach appears diffusely thick walled. Whether this is a true finding or due to decompression is unclear.  Recommend upper GI or direct visualization. 3. Three-vessel coronary artery disease. 4. Colonic diverticulosis without diverticulitis. 5. Calcified atherosclerosis in the abdominal aorta. 6. The infrarenal abdominal aorta measures 3.1 cm. Recommend follow-up ultrasound every 3 years. This recommendation follows ACR consensus guidelines: White Paper of the ACR Incidental Findings Committee II on Vascular Findings. J Am Coll Radiol 2013; 10:789-794. 7. Age-indeterminate compression fracture of T11 with 50% loss of vertebral body height. Recommend clinical correlation. Electronically Signed   By: Dorise Bullion III M.D.   On: 02/09/2021 11:28   VAS [place] LOWER EXTREMITY VENOUS (DVT)  Result Date: 02/10/2021  Lower Venous DVT Study Patient Name:  Perry Rosales  Date of Exam:   02/10/2021 Medical Rec #: [id number]       Accession #:    [id number] Date of  Birth: [birth year]       Patient Gender: M Patient Age:   63 years Exam Location:  [facility] Procedure:      VAS [place] LOWER EXTREMITY VENOUS (DVT) Referring Phys: Wynetta Fines --------------------------------------------------------------------------------  Indications: Edema.  Risk Factors: None identified. Comparison Study: No prior studies. Performing Technologist: Oliver Hum RVT  Examination Guidelines: A complete evaluation includes B-mode imaging, spectral Doppler, color Doppler, and power Doppler as needed of all accessible portions of each vessel. Bilateral testing is considered an integral part of a complete examination. Limited examinations for reoccurring indications may be performed as noted. The reflux portion of the exam is performed with the patient in reverse Trendelenburg.  +---------+---------------+---------+-----------+----------+--------------+ RIGHT    CompressibilityPhasicitySpontaneityPropertiesThrombus Aging +---------+---------------+---------+-----------+----------+--------------+ CFV      Full           Yes      Yes                                  +---------+---------------+---------+-----------+----------+--------------+ SFJ      Full                                                        +---------+---------------+---------+-----------+----------+--------------+ FV Prox  Full                                                        +---------+---------------+---------+-----------+----------+--------------+ FV Mid   Full                                                        +---------+---------------+---------+-----------+----------+--------------+ FV DistalFull                                                        +---------+---------------+---------+-----------+----------+--------------+ PFV      Full                                                        +---------+---------------+---------+-----------+----------+--------------+ POP      Full           Yes      Yes                                 +---------+---------------+---------+-----------+----------+--------------+ PTV      Full                                                        +---------+---------------+---------+-----------+----------+--------------+  PERO     Full                                                        +---------+---------------+---------+-----------+----------+--------------+   +---------+---------------+---------+-----------+----------+--------------+ LEFT     CompressibilityPhasicitySpontaneityPropertiesThrombus Aging +---------+---------------+---------+-----------+----------+--------------+ CFV      Full           Yes      Yes                                 +---------+---------------+---------+-----------+----------+--------------+ SFJ      Full                                                        +---------+---------------+---------+-----------+----------+--------------+ FV Prox  Full                                                         +---------+---------------+---------+-----------+----------+--------------+ FV Mid   Full                                                        +---------+---------------+---------+-----------+----------+--------------+ FV DistalFull                                                        +---------+---------------+---------+-----------+----------+--------------+ PFV      Full                                                        +---------+---------------+---------+-----------+----------+--------------+ POP      Full           Yes      Yes                                 +---------+---------------+---------+-----------+----------+--------------+ PTV      Full                                                        +---------+---------------+---------+-----------+----------+--------------+ PERO     Full                                                        +---------+---------------+---------+-----------+----------+--------------+  Summary: RIGHT: - There is no evidence of deep vein thrombosis in the lower extremity.  - No cystic structure found in the popliteal fossa.  LEFT: - There is no evidence of deep vein thrombosis in the lower extremity.  - No cystic structure found in the popliteal fossa.  *See table(s) above for measurements and observations. Electronically signed by Monica Martinez MD on 02/10/2021 at 4:11:13 PM.    Final      Discharge Exam: Vitals:   02/11/21 2040 02/12/21 0555  BP: (!) 162/74 (!) 106/59  Pulse: 84 75  Resp: 17 18  Temp: 98.8 F (37.1 C)   SpO2: 98% 97%   Vitals:   02/11/21 0445 02/11/21 1319 02/11/21 2040 02/12/21 0555  BP: (!) 153/66 (!) 172/84 (!) 162/74 (!) 106/59  Pulse: 85 91 84 75  Resp: '17 18 17 18  '$ Temp: 97.9 F (36.6 C) 97.6 F (36.4 C) 98.8 F (37.1 C)   TempSrc: Oral Oral Oral   SpO2: 97% 98% 98% 97%  Weight:      Height:        General: Pt is alert, awake, not in acute distress Cardiovascular: RRR,  S1/S2 +, no rubs, no gallops Respiratory: CTA bilaterally, no wheezing, no rhonchi Abdominal: Soft, NT, ND, bowel sounds + Extremities: no edema, no cyanosis    The results of significant diagnostics from this hospitalization (including imaging, microbiology, ancillary and laboratory) are listed below for reference.     Microbiology: Recent Results (from the past 240 hour(s))  Urine Culture     Status: Abnormal   Collection Time: 02/04/21  8:19 PM   Specimen: Urine, Clean Catch  Result Value Ref Range Status   Specimen Description   Final    URINE, CLEAN CATCH Performed at [facility], [address], [place], [place] [address]    Special Requests   Final    NONE Performed at [facility], [address] [place], [place] [address]    Culture >=100,000 COLONIES/mL ESCHERICHIA COLI (A)  Final   Report Status 02/07/2021 FINAL  Final   Organism ID, Bacteria ESCHERICHIA COLI (A)  Final      Susceptibility   Escherichia coli - MIC*    AMPICILLIN 4 SENSITIVE Sensitive     CEFAZOLIN <=4 SENSITIVE Sensitive     CEFEPIME <=0.12 SENSITIVE Sensitive     CEFTRIAXONE <=0.25 SENSITIVE Sensitive     CIPROFLOXACIN <=0.25 SENSITIVE Sensitive     GENTAMICIN <=1 SENSITIVE Sensitive     IMIPENEM <=0.25 SENSITIVE Sensitive     NITROFURANTOIN <=16 SENSITIVE Sensitive     TRIMETH/SULFA <=20 SENSITIVE Sensitive     AMPICILLIN/SULBACTAM <=2 SENSITIVE Sensitive     PIP/TAZO <=4 SENSITIVE Sensitive     * >=100,000 COLONIES/mL ESCHERICHIA COLI  Urine Culture     Status: None   Collection Time: 02/09/21 10:35 AM   Specimen: Urine, Catheterized  Result Value Ref Range Status   Specimen Description   Final    URINE, CATHETERIZED Performed at [facility], [address], [place], [place] [address]    Special Requests   Final    NONE Performed at [facility], [address], [place], [place] [address]    Culture    Final    NO GROWTH Performed at [facility], [place] [address] [place], [place] [address]    Report Status 02/10/2021 FINAL  Final  Resp Panel by RT-PCR (Flu A&B, Covid) Nasopharyngeal Swab     Status:  None   Collection Time: 02/09/21 11:24 AM   Specimen: Nasopharyngeal Swab; Nasopharyngeal(NP) swabs in vial transport medium  Result Value Ref Range Status   SARS Coronavirus 2 by RT PCR NEGATIVE NEGATIVE Final    Comment: (NOTE) SARS-CoV-2 target nucleic acids are NOT DETECTED.  The SARS-CoV-2 RNA is generally detectable in upper respiratory specimens during the acute phase of infection. The lowest concentration of SARS-CoV-2 viral copies this assay can detect is 138 copies/mL. A negative result does not preclude SARS-Cov-2 infection and should not be used as the sole basis for treatment or other patient management decisions. A negative result may occur with  improper specimen collection/handling, submission of specimen other than nasopharyngeal swab, presence of viral mutation(s) within the areas targeted by this assay, and inadequate number of viral copies(<138 copies/mL). A negative result must be combined with clinical observations, patient history, and epidemiological information. The expected result is Negative.  Fact Sheet for Patients:  EntrepreneurPulse.com.au  Fact Sheet for Healthcare Providers:  IncredibleEmployment.be  This test is no t yet approved or cleared by the [place] FDA and  has been authorized for detection and/or diagnosis of SARS-CoV-2 by FDA under an Emergency Use Authorization (EUA). This EUA will remain  in effect (meaning this test can be used) for the duration of the COVID-19 declaration under Section 564(b)(1) of the Act, 21 U.S.C.section 360bbb-3(b)(1), unless the authorization is terminated  or revoked sooner.       Influenza A by PCR NEGATIVE NEGATIVE Final   Influenza B by PCR NEGATIVE NEGATIVE  Final    Comment: (NOTE) The Xpert Xpress SARS-CoV-2/FLU/RSV plus assay is intended as an aid in the diagnosis of influenza from Nasopharyngeal swab specimens and should not be used as a sole basis for treatment. Nasal washings and aspirates are unacceptable for Xpert Xpress SARS-CoV-2/FLU/RSV testing.  Fact Sheet for Patients: EntrepreneurPulse.com.au  Fact Sheet for Healthcare Providers: IncredibleEmployment.be  This test is not yet approved or cleared by the [place] FDA and has been authorized for detection and/or diagnosis of SARS-CoV-2 by FDA under an Emergency Use Authorization (EUA). This EUA will remain in effect (meaning this test can be used) for the duration of the COVID-19 declaration under Section 564(b)(1) of the Act, 21 U.S.C. section 360bbb-3(b)(1), unless the authorization is terminated or revoked.  Performed at [facility], [address] [place], [place] [address]      Labs: BNP (last 3 results) Recent Labs    02/10/21 1309  BNP [id number]*   Basic Metabolic Panel: Recent Labs  Lab 02/09/21 0921 02/09/21 1741 02/09/21 1813 02/10/21 0833 02/12/21 0533  NA 134*  --  137 137 140  K 4.2  --  3.7 3.7 3.8  CL 97*  --  103 101 104  CO2 22  --  '23 27 27  '$ GLUCOSE 110*  --  125* 121* 98  BUN 58*  --  48* 43* 26*  CREATININE 3.45*  --  2.64* 1.84* 1.32*  CALCIUM 8.3*  --  8.0* 8.6* 8.5*  MG 0.6* 1.4*  --  1.4* 1.2*   Liver Function Tests: Recent Labs  Lab 02/09/21 0921  AST 17  ALT 15  ALKPHOS 65  BILITOT 0.5  PROT 7.4  ALBUMIN 4.0   No results for input(s): LIPASE, AMYLASE in the last 168 hours. No results for input(s): AMMONIA in the last 168 hours. CBC: Recent Labs  Lab 02/09/21 0921  WBC 11.1*  NEUTROABS 8.9*  HGB 13.7  HCT 39.9  MCV 89.3  PLT 160   Cardiac Enzymes: No results for input(s): CKTOTAL, CKMB, CKMBINDEX, TROPONINI in the last 168 hours. BNP: Invalid  input(s): POCBNP CBG: No results for input(s): GLUCAP in the last 168 hours. D-Dimer No results for input(s): DDIMER in the last 72 hours. Hgb A1c No results for input(s): HGBA1C in the last 72 hours. Lipid Profile No results for input(s): CHOL, HDL, LDLCALC, TRIG, CHOLHDL, LDLDIRECT in the last 72 hours. Thyroid function studies No results for input(s): TSH, T4TOTAL, T3FREE, THYROIDAB in the last 72 hours.  Invalid input(s): FREET3 Anemia work up No results for input(s): VITAMINB12, FOLATE, FERRITIN, TIBC, IRON, RETICCTPCT in the last 72 hours. Urinalysis    Component Value Date/Time   COLORURINE ORANGE (A) 02/09/2021 0930   APPEARANCEUR HAZY (A) 02/09/2021 0930   LABSPEC 1.020 02/09/2021 0930   PHURINE 6.0 02/09/2021 0930   GLUCOSEU NEGATIVE 02/09/2021 0930   HGBUR SMALL (A) 02/09/2021 0930   BILIRUBINUR MODERATE (A) 02/09/2021 0930   KETONESUR NEGATIVE 02/09/2021 0930   PROTEINUR 30 (A) 02/09/2021 0930   NITRITE POSITIVE (A) 02/09/2021 0930   LEUKOCYTESUR LARGE (A) 02/09/2021 0930   Sepsis Labs Invalid input(s): PROCALCITONIN,  WBC,  LACTICIDVEN Microbiology Recent Results (from the past 240 hour(s))  Urine Culture     Status: Abnormal   Collection Time: 02/04/21  8:19 PM   Specimen: Urine, Clean Catch  Result Value Ref Range Status   Specimen Description   Final    URINE, CLEAN CATCH Performed at [facility], [address] [place], [place] [address]    Special Requests   Final    NONE Performed at [facility], [address], [place], [place] [address]    Culture >=100,000 COLONIES/mL ESCHERICHIA COLI (A)  Final   Report Status 02/07/2021 FINAL  Final   Organism ID, Bacteria ESCHERICHIA COLI (A)  Final      Susceptibility   Escherichia coli - MIC*    AMPICILLIN 4 SENSITIVE Sensitive     CEFAZOLIN <=4 SENSITIVE Sensitive     CEFEPIME <=0.12 SENSITIVE Sensitive     CEFTRIAXONE <=0.25 SENSITIVE Sensitive      CIPROFLOXACIN <=0.25 SENSITIVE Sensitive     GENTAMICIN <=1 SENSITIVE Sensitive     IMIPENEM <=0.25 SENSITIVE Sensitive     NITROFURANTOIN <=16 SENSITIVE Sensitive     TRIMETH/SULFA <=20 SENSITIVE Sensitive     AMPICILLIN/SULBACTAM <=2 SENSITIVE Sensitive     PIP/TAZO <=4 SENSITIVE Sensitive     * >=100,000 COLONIES/mL ESCHERICHIA COLI  Urine Culture     Status: None   Collection Time: 02/09/21 10:35 AM   Specimen: Urine, Catheterized  Result Value Ref Range Status   Specimen Description   Final    URINE, CATHETERIZED Performed at [facility], [address], [place], [place] [address]    Special Requests   Final    NONE Performed at [facility], [address], [place], [place] [address]    Culture   Final    NO GROWTH Performed at [facility], [place] [address] [place], [place] [address]    Report Status 02/10/2021 FINAL  Final  Resp Panel by RT-PCR (Flu A&B, Covid) Nasopharyngeal Swab     Status: None   Collection Time: 02/09/21 11:24 AM   Specimen: Nasopharyngeal Swab; Nasopharyngeal(NP) swabs in vial transport medium  Result Value Ref Range Status   SARS Coronavirus 2 by RT PCR NEGATIVE NEGATIVE Final    Comment: (NOTE) SARS-CoV-2 target nucleic acids  are NOT DETECTED.  The SARS-CoV-2 RNA is generally detectable in upper respiratory specimens during the acute phase of infection. The lowest concentration of SARS-CoV-2 viral copies this assay can detect is 138 copies/mL. A negative result does not preclude SARS-Cov-2 infection and should not be used as the sole basis for treatment or other patient management decisions. A negative result may occur with  improper specimen collection/handling, submission of specimen other than nasopharyngeal swab, presence of viral mutation(s) within the areas targeted by this assay, and inadequate number of viral copies(<138 copies/mL). A negative result must be combined with clinical  observations, patient history, and epidemiological information. The expected result is Negative.  Fact Sheet for Patients:  EntrepreneurPulse.com.au  Fact Sheet for Healthcare Providers:  IncredibleEmployment.be  This test is no t yet approved or cleared by the [place] FDA and  has been authorized for detection and/or diagnosis of SARS-CoV-2 by FDA under an Emergency Use Authorization (EUA). This EUA will remain  in effect (meaning this test can be used) for the duration of the COVID-19 declaration under Section 564(b)(1) of the Act, 21 U.S.C.section 360bbb-3(b)(1), unless the authorization is terminated  or revoked sooner.       Influenza A by PCR NEGATIVE NEGATIVE Final   Influenza B by PCR NEGATIVE NEGATIVE Final    Comment: (NOTE) The Xpert Xpress SARS-CoV-2/FLU/RSV plus assay is intended as an aid in the diagnosis of influenza from Nasopharyngeal swab specimens and should not be used as a sole basis for treatment. Nasal washings and aspirates are unacceptable for Xpert Xpress SARS-CoV-2/FLU/RSV testing.  Fact Sheet for Patients: EntrepreneurPulse.com.au  Fact Sheet for Healthcare Providers: IncredibleEmployment.be  This test is not yet approved or cleared by the [place] FDA and has been authorized for detection and/or diagnosis of SARS-CoV-2 by FDA under an Emergency Use Authorization (EUA). This EUA will remain in effect (meaning this test can be used) for the duration of the COVID-19 declaration under Section 564(b)(1) of the Act, 21 U.S.C. section 360bbb-3(b)(1), unless the authorization is terminated or revoked.  Performed at [facility], [address] [place], [place] [address]      Time coordinating discharge: Over 30 minutes  SIGNED:   Darliss Cheney, MD  [facility] 02/12/2021, 9:26 AM  If 7PM-7AM, please contact  night-coverage [email or web address]

## 2021-02-12 NOTE — Progress Notes (Signed)
OT Cancellation Note  Patient Details Name: Perry Rosales MRN: [id number] DOB: 12/22/1935   Cancelled Treatment:    Reason Eval/Treat Not Completed: OT screened, no needs identified, will sign off.  Rhanda Lemire L Hailynn Slovacek 02/12/2021, 11:42 AM

## 2021-02-12 NOTE — Progress Notes (Signed)
Pt discharged home with all belongings and supplies for catheter care maintenance at home. Reviewed catheter care maintenance and care at length with pt with demonstration and teach back methods. Pt demonstrated knowledge of catheter care, emptying and recording urinary output, as well as changing out standard drainage bag to leg bag. All questions answered and concerns addressed with pt and pt's son at bedside. Discharge medications reviewed as well. Encouraged pt to f/u with provider for any needs.

## 2021-02-12 NOTE — Progress Notes (Signed)
PT Cancellation Note  Patient Details Name: JAITHAN MORLAN MRN: [id number] DOB: Aug 03, 1935   Cancelled Treatment:    Reason Eval/Treat Not Completed: PT screened, no needs identified, will sign off, patient reports no needs from PT.   Claretha Cooper 02/12/2021, 10:47 AM [place] Pager [phone] Office [phone]

## 2021-05-01 ENCOUNTER — Other Ambulatory Visit: Payer: Self-pay | Admitting: Urology

## 2021-07-17 ENCOUNTER — Ambulatory Visit: Payer: Medicare Other

## 2021-07-17 ENCOUNTER — Encounter: Payer: Self-pay | Admitting: Podiatry

## 2021-07-17 ENCOUNTER — Ambulatory Visit (INDEPENDENT_AMBULATORY_CARE_PROVIDER_SITE_OTHER): Payer: Medicare Other | Admitting: Podiatry

## 2021-07-17 ENCOUNTER — Other Ambulatory Visit: Payer: Self-pay

## 2021-07-17 DIAGNOSIS — D2372 Other benign neoplasm of skin of left lower limb, including hip: Secondary | ICD-10-CM | POA: Diagnosis not present

## 2021-07-17 DIAGNOSIS — D2371 Other benign neoplasm of skin of right lower limb, including hip: Secondary | ICD-10-CM

## 2021-07-17 DIAGNOSIS — N320 Bladder-neck obstruction: Secondary | ICD-10-CM | POA: Insufficient documentation

## 2021-07-17 DIAGNOSIS — N401 Enlarged prostate with lower urinary tract symptoms: Secondary | ICD-10-CM | POA: Insufficient documentation

## 2021-07-17 NOTE — Progress Notes (Signed)
°  Subjective:  Patient ID: DEMARR KLUEVER, male    DOB: 01-Jan-1936,  MRN: [id number] HPI Chief Complaint  Patient presents with   Foot Pain    Sub 1st MPJ bilateral and sub 5th MPJ left - callused areas x years (L>R), tender walking, changes his shoes often through the day, thinks he may be getting neuropathy-sharp, needle-like sensations bilateral feet plantarly   New Patient (Initial Visit)    86 y.o. male presents with the above complaint.   ROS: Denies fever chills nausea vomiting muscle aches pains calf pain back pain chest pain shortness of breath.  Past Medical History:  Diagnosis Date   GERD (gastroesophageal reflux disease)    Heart murmur    Hypertension    Past Surgical History:  Procedure Laterality Date   CATARACT EXTRACTION W/ INTRAOCULAR LENS  IMPLANT, BILATERAL     CHOLECYSTECTOMY     COLONOSCOPY      Current Outpatient Medications:    amitriptyline (ELAVIL) 50 MG tablet, Take 50 mg by mouth at bedtime., Disp: , Rfl:    amLODipine (NORVASC) 5 MG tablet, Take 1 tablet (5 mg total) by mouth daily., Disp: 30 tablet, Rfl: 11   aspirin 81 MG tablet, Take 81 mg by mouth daily., Disp: , Rfl:    omeprazole (PRILOSEC) 20 MG capsule, Take 20 mg by mouth daily., Disp: , Rfl:    tamsulosin (FLOMAX) 0.4 MG CAPS capsule, Take 0.4 mg by mouth in the morning and at bedtime., Disp: , Rfl:    traMADol-acetaminophen (ULTRACET) 37.5-325 MG per tablet, Take 1 tablet by mouth See admin instructions. 1 tablet in the morning, 2 tablets at lunch, and 2 tablets at dinner., Disp: , Rfl:   No Known Allergies Review of Systems Objective:  There were no vitals filed for this visit.  General: Well developed, nourished, in no acute distress, alert and oriented x3   Dermatological: Skin is warm, dry and supple bilateral. Nails x 10 are well maintained; remaining integument appears unremarkable at this time. There are no open sores, no preulcerative lesions, no rash or signs of infection  present.  Fat pad atrophy forefoot bilateral he is experiencing severe benign skin lesions of the first metatarsal phalangeal joint and some fifth metatarsophalangeal joint left foot primarily.  Vascular: Dorsalis Pedis artery and Posterior Tibial artery pedal pulses are 2/4 bilateral with immedate capillary fill time. Pedal hair growth present. No varicosities and no lower extremity edema present bilateral.   Neruologic: Grossly intact via light touch bilateral. Vibratory intact via tuning fork bilateral. Protective threshold with Semmes Wienstein monofilament intact to all pedal sites bilateral. Patellar and Achilles deep tendon reflexes 2+ bilateral. No Babinski or clonus noted bilateral.   Musculoskeletal: No gross boney pedal deformities bilateral. No pain, crepitus, or limitation noted with foot and ankle range of motion bilateral. Muscular strength 5/5 in all groups tested bilateral.  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  Plan: Debridement benign skin lesions follow-up as needed     Cristie Mckinney T. [place], [place]

## 2021-11-28 ENCOUNTER — Other Ambulatory Visit: Payer: Self-pay | Admitting: Internal Medicine

## 2021-11-28 DIAGNOSIS — Z87891 Personal history of nicotine dependence: Secondary | ICD-10-CM

## 2021-12-05 ENCOUNTER — Ambulatory Visit
Admission: RE | Admit: 2021-12-05 | Discharge: 2021-12-05 | Disposition: A | Discharge status: Home / Self Care | Payer: Medicare Other | Source: Ambulatory Visit | Attending: Internal Medicine | Admitting: Internal Medicine

## 2021-12-05 DIAGNOSIS — Z87891 Personal history of nicotine dependence: Secondary | ICD-10-CM

## 2022-01-28 ENCOUNTER — Other Ambulatory Visit: Payer: Self-pay | Admitting: Gastroenterology

## 2022-01-28 DIAGNOSIS — K219 Gastro-esophageal reflux disease without esophagitis: Secondary | ICD-10-CM

## 2022-01-28 DIAGNOSIS — R9389 Abnormal findings on diagnostic imaging of other specified body structures: Secondary | ICD-10-CM

## 2022-02-27 ENCOUNTER — Other Ambulatory Visit: Payer: Medicare Other

## 2022-03-04 ENCOUNTER — Ambulatory Visit
Admission: RE | Admit: 2022-03-04 | Discharge: 2022-03-04 | Disposition: A | Discharge status: Home / Self Care | Payer: Medicare Other | Source: Ambulatory Visit | Attending: Gastroenterology | Admitting: Gastroenterology

## 2022-03-04 DIAGNOSIS — K219 Gastro-esophageal reflux disease without esophagitis: Secondary | ICD-10-CM

## 2022-03-04 DIAGNOSIS — R9389 Abnormal findings on diagnostic imaging of other specified body structures: Secondary | ICD-10-CM

## 2022-05-09 IMAGING — CT CT RENAL STONE PROTOCOL
2 of 4 series · 15 of 46 positions shown, 17 images · non-contrast
Comparison: None.

CLINICAL DATA: Urinary retention.  UTI.

EXAM:
CT ABDOMEN AND PELVIS WITHOUT CONTRAST
TECHNIQUE: Multidetector CT imaging of the abdomen and pelvis was performed
following the standard protocol without IV contrast.

[Series 2: stone full · axial · 0.78mm/px · z∈[+761,+1176]mm · 12 of 91 slices shown, 14 images]
[im 4/91  soft-tissue]
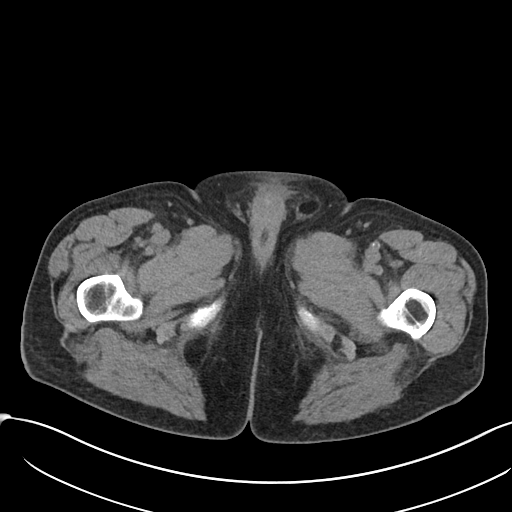
[im 4/91  bone]
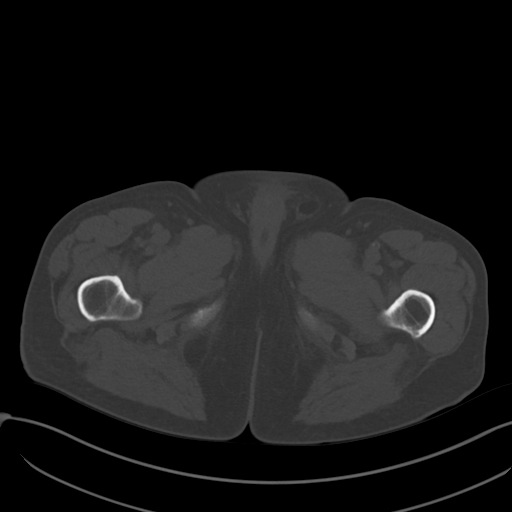
[im 12/91  soft-tissue]
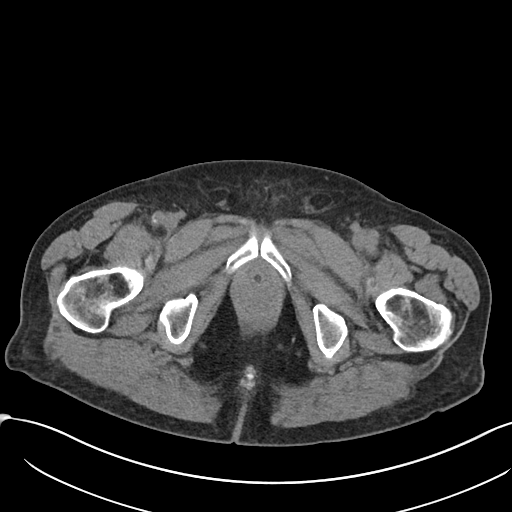
[im 19/91  soft-tissue]
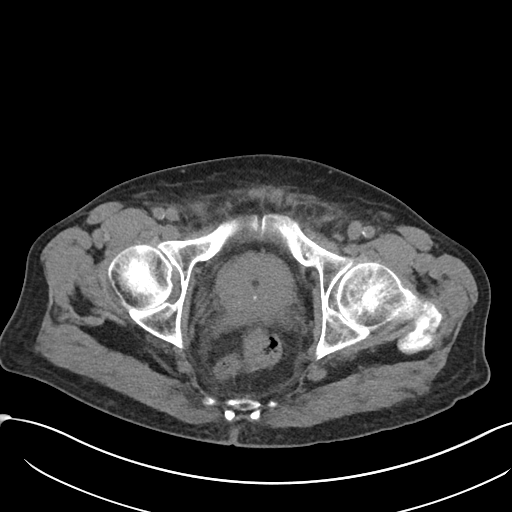
[im 27/91  soft-tissue]
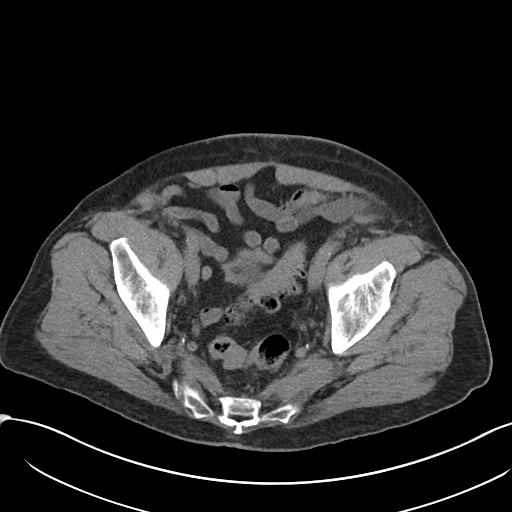
[im 34/91  soft-tissue]
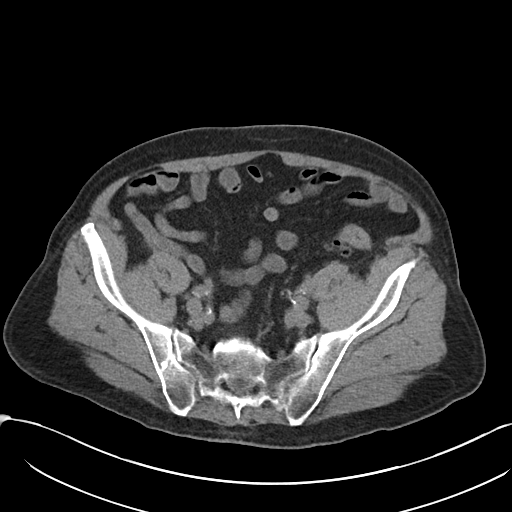
[im 42/91  soft-tissue]
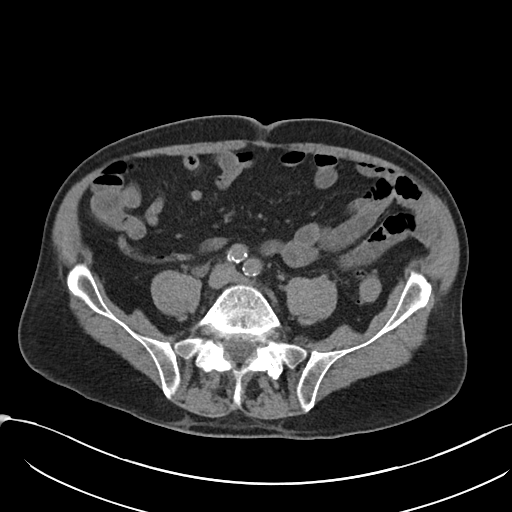
[im 49/91  soft-tissue]
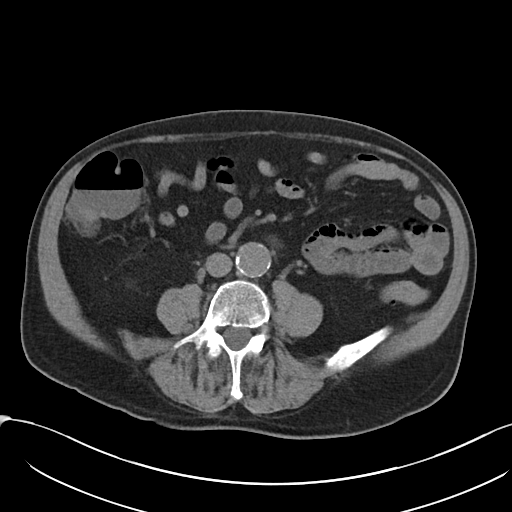
[im 57/91  soft-tissue]
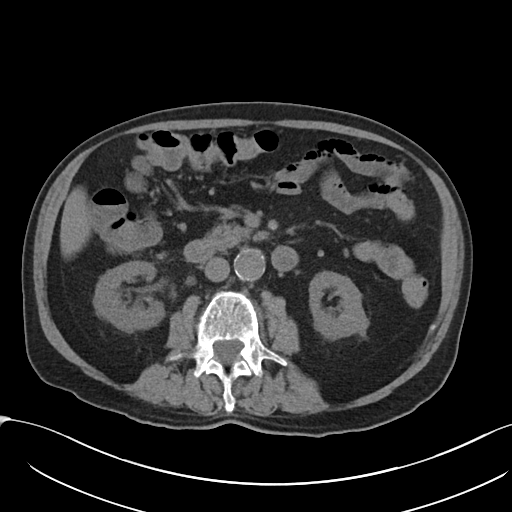
[im 64/91  soft-tissue]
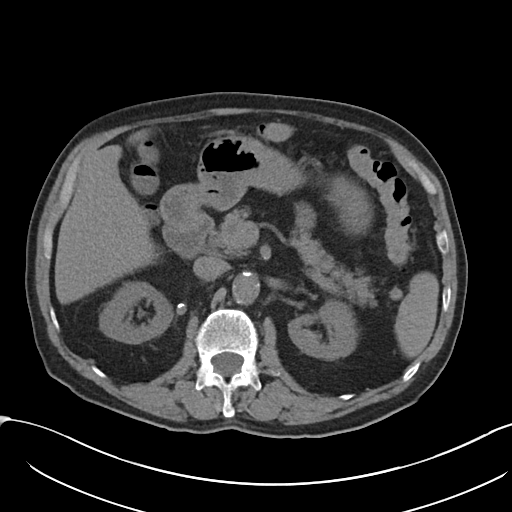
[im 64/91  bone]
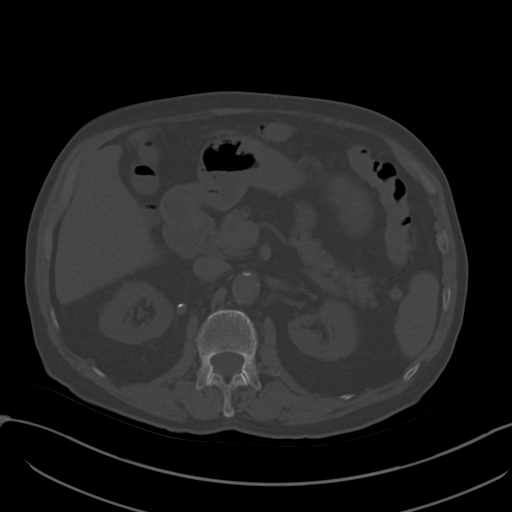
[im 72/91  soft-tissue]
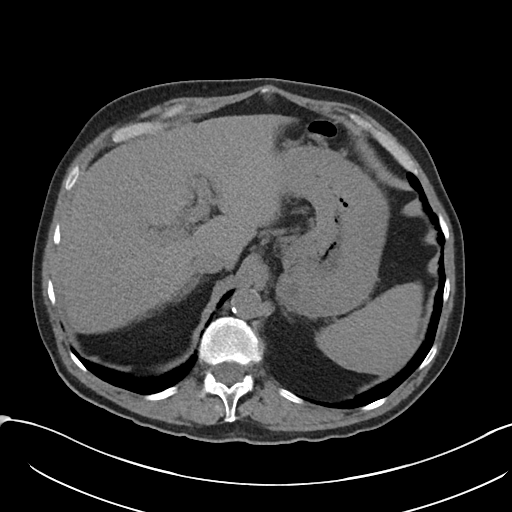
[im 79/91  soft-tissue]
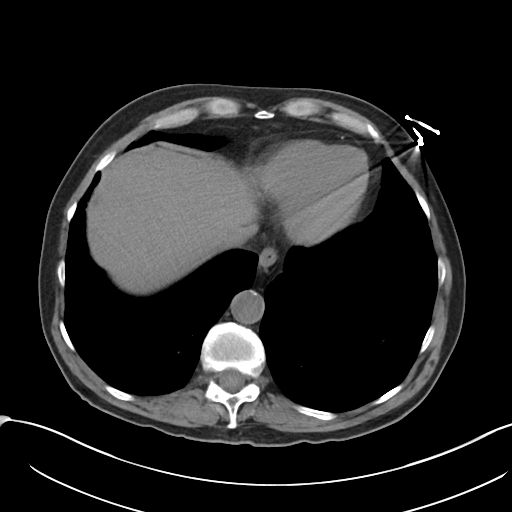
[im 87/91  soft-tissue]
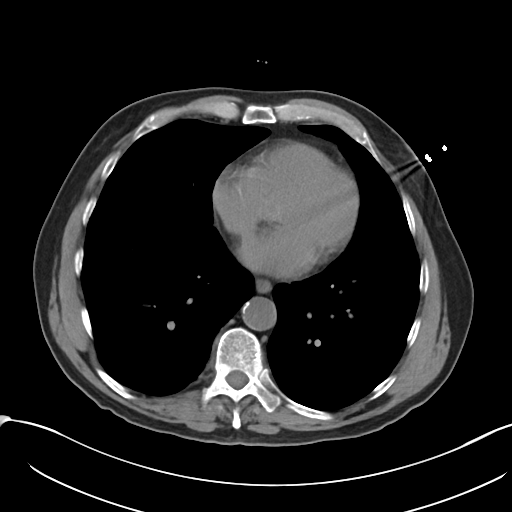

[Series 5: coronal · coronal · 0.76mm/px · 3 of 94 slices shown]
[im 32/94  soft-tissue]
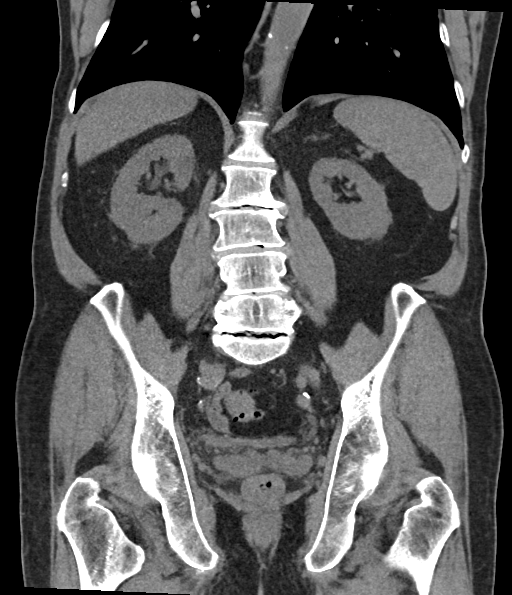
[im 42/94  soft-tissue]
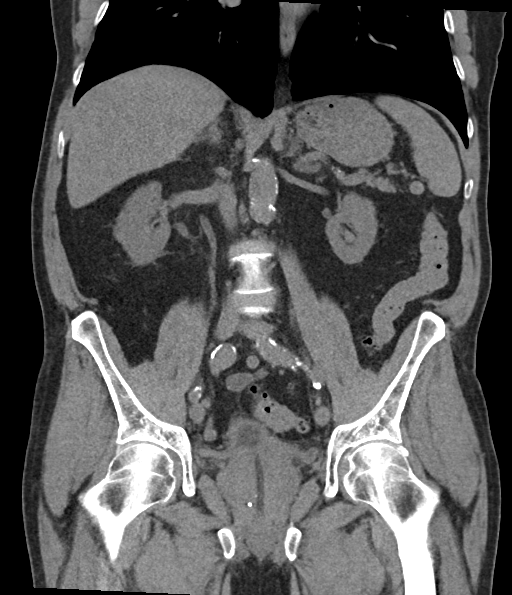
[im 52/94  soft-tissue]
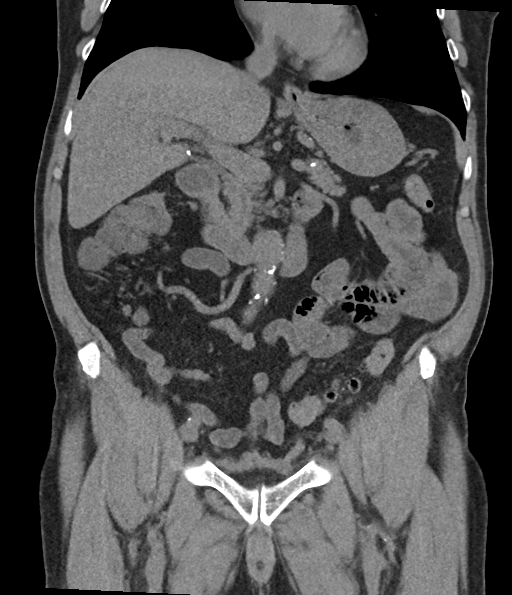

[15 of 46 positions shown; findings below may reference images not displayed]

FINDINGS: Lower chest: Three-vessel coronary artery disease. No other
abnormalities.

Hepatobiliary: No focal liver abnormality is seen. Status post
cholecystectomy. No biliary dilatation.

Pancreas: Unremarkable. No pancreatic ductal dilatation or
surrounding inflammatory changes.

Spleen: Normal in size without focal abnormality.

Adrenals/Urinary Tract: Adrenal glands are normal. No renal stones.
There is a cyst in the upper pole the right kidney. No suspicious
masses. No suspicious perinephric stranding or hydronephrosis. The
left ureter is normal with no stones or dilatation. There is a
duplicated collecting system on the right with 2 ureters noted. The
right ureters are mildly prominent compared to the left. However, no
ureteral stones are noted. The bladder is decompressed with a Foley
catheter.

Stomach/Bowel: The bladder is diffusely thick walled in appearance.
The small bowel is normal. Colonic diverticulosis is seen without
diverticulitis. The remainder of the colon is normal. The appendix
is normal.

Vascular/Lymphatic: Calcified atherosclerosis is seen in the
abdominal aorta. The infrarenal abdominal aorta measures up to
cm. Calcified atherosclerosis extends into the iliac and femoral
vessels. No adenopathy.

Reproductive: The prostate appears prominent.

Other: No free air free fluid.  Fat containing umbilical hernia.

Musculoskeletal: Age indeterminate compression fracture of T11 with
approximately 50% loss of vertebral body height.
IMPRESSION: 1. No renal or ureteral stones identified. There is a duplicated
right renal collecting system. There are 2 right ureters which are
mildly prominent compared to the left. However, there are no stones
seen along the course of the right ureters. The bladder is
decompressed with a Foley catheter.
2. The stomach appears diffusely thick walled. Whether this is a
true finding or due to decompression is unclear. Recommend upper GI
or direct visualization.
3. Three-vessel coronary artery disease.
4. Colonic diverticulosis without diverticulitis.
5. Calcified atherosclerosis in the abdominal aorta.
6. The infrarenal abdominal aorta measures 3.1 cm. Recommend
follow-up ultrasound every 3 years. This recommendation follows ACR
consensus guidelines: White Paper of the ACR Incidental Findings
Committee II on Vascular Findings. [HOSPITAL] 5596;
7. Age-indeterminate compression fracture of T11 with 50% loss of
vertebral body height. Recommend clinical correlation.

## 2022-09-08 ENCOUNTER — Inpatient Hospital Stay (HOSPITAL_COMMUNITY)
Admission: EM | Admit: 2022-09-08 | Discharge: 2022-09-12 | DRG: 521 | Disposition: A | Discharge status: Rehabilitation Facility | Payer: Medicare Other | Attending: Family Medicine | Admitting: Family Medicine

## 2022-09-08 ENCOUNTER — Emergency Department (HOSPITAL_COMMUNITY): Payer: Medicare Other

## 2022-09-08 ENCOUNTER — Other Ambulatory Visit: Payer: Self-pay

## 2022-09-08 ENCOUNTER — Inpatient Hospital Stay (HOSPITAL_COMMUNITY): Payer: Medicare Other

## 2022-09-08 ENCOUNTER — Encounter (HOSPITAL_COMMUNITY): Payer: Self-pay

## 2022-09-08 DIAGNOSIS — Z7982 Long term (current) use of aspirin: Secondary | ICD-10-CM | POA: Diagnosis not present

## 2022-09-08 DIAGNOSIS — Z471 Aftercare following joint replacement surgery: Secondary | ICD-10-CM | POA: Diagnosis not present

## 2022-09-08 DIAGNOSIS — Z961 Presence of intraocular lens: Secondary | ICD-10-CM | POA: Diagnosis present

## 2022-09-08 DIAGNOSIS — I10 Essential (primary) hypertension: Secondary | ICD-10-CM | POA: Diagnosis not present

## 2022-09-08 DIAGNOSIS — I5021 Acute systolic (congestive) heart failure: Secondary | ICD-10-CM

## 2022-09-08 DIAGNOSIS — S72002A Fracture of unspecified part of neck of left femur, initial encounter for closed fracture: Secondary | ICD-10-CM

## 2022-09-08 DIAGNOSIS — I110 Hypertensive heart disease with heart failure: Secondary | ICD-10-CM | POA: Diagnosis present

## 2022-09-08 DIAGNOSIS — I130 Hypertensive heart and chronic kidney disease with heart failure and stage 1 through stage 4 chronic kidney disease, or unspecified chronic kidney disease: Secondary | ICD-10-CM | POA: Diagnosis not present

## 2022-09-08 DIAGNOSIS — Z87891 Personal history of nicotine dependence: Secondary | ICD-10-CM | POA: Diagnosis not present

## 2022-09-08 DIAGNOSIS — I272 Pulmonary hypertension, unspecified: Secondary | ICD-10-CM | POA: Diagnosis present

## 2022-09-08 DIAGNOSIS — W010XXA Fall on same level from slipping, tripping and stumbling without subsequent striking against object, initial encounter: Secondary | ICD-10-CM | POA: Diagnosis present

## 2022-09-08 DIAGNOSIS — N189 Chronic kidney disease, unspecified: Secondary | ICD-10-CM | POA: Diagnosis not present

## 2022-09-08 DIAGNOSIS — I5041 Acute combined systolic (congestive) and diastolic (congestive) heart failure: Secondary | ICD-10-CM | POA: Diagnosis present

## 2022-09-08 DIAGNOSIS — Y92009 Unspecified place in unspecified non-institutional (private) residence as the place of occurrence of the external cause: Secondary | ICD-10-CM | POA: Diagnosis not present

## 2022-09-08 DIAGNOSIS — E860 Dehydration: Secondary | ICD-10-CM | POA: Diagnosis present

## 2022-09-08 DIAGNOSIS — I2510 Atherosclerotic heart disease of native coronary artery without angina pectoris: Secondary | ICD-10-CM | POA: Diagnosis present

## 2022-09-08 DIAGNOSIS — R600 Localized edema: Secondary | ICD-10-CM | POA: Diagnosis not present

## 2022-09-08 DIAGNOSIS — S72012A Unspecified intracapsular fracture of left femur, initial encounter for closed fracture: Principal | ICD-10-CM | POA: Diagnosis present

## 2022-09-08 DIAGNOSIS — I429 Cardiomyopathy, unspecified: Secondary | ICD-10-CM | POA: Diagnosis present

## 2022-09-08 DIAGNOSIS — Z9841 Cataract extraction status, right eye: Secondary | ICD-10-CM | POA: Diagnosis not present

## 2022-09-08 DIAGNOSIS — N179 Acute kidney failure, unspecified: Secondary | ICD-10-CM | POA: Diagnosis present

## 2022-09-08 DIAGNOSIS — F17210 Nicotine dependence, cigarettes, uncomplicated: Secondary | ICD-10-CM | POA: Diagnosis present

## 2022-09-08 DIAGNOSIS — I509 Heart failure, unspecified: Secondary | ICD-10-CM | POA: Diagnosis not present

## 2022-09-08 DIAGNOSIS — Z79899 Other long term (current) drug therapy: Secondary | ICD-10-CM | POA: Diagnosis not present

## 2022-09-08 DIAGNOSIS — S72002S Fracture of unspecified part of neck of left femur, sequela: Secondary | ICD-10-CM | POA: Diagnosis not present

## 2022-09-08 DIAGNOSIS — S72002D Fracture of unspecified part of neck of left femur, subsequent encounter for closed fracture with routine healing: Secondary | ICD-10-CM | POA: Diagnosis not present

## 2022-09-08 DIAGNOSIS — Z9842 Cataract extraction status, left eye: Secondary | ICD-10-CM | POA: Diagnosis not present

## 2022-09-08 DIAGNOSIS — K219 Gastro-esophageal reflux disease without esophagitis: Secondary | ICD-10-CM | POA: Diagnosis present

## 2022-09-08 DIAGNOSIS — N401 Enlarged prostate with lower urinary tract symptoms: Secondary | ICD-10-CM | POA: Diagnosis present

## 2022-09-08 DIAGNOSIS — I420 Dilated cardiomyopathy: Secondary | ICD-10-CM | POA: Diagnosis not present

## 2022-09-08 HISTORY — DX: Fracture of unspecified part of neck of left femur, initial encounter for closed fracture: S72.002A

## 2022-09-08 LAB — ECHOCARDIOGRAM COMPLETE
AR max vel: 1.77 cm2
AV Area VTI: 1.71 cm2
AV Area mean vel: 1.72 cm2
AV Mean grad: 7 mmHg
AV Peak grad: 12.7 mmHg
Ao pk vel: 1.79 m/s
Area-P 1/2: 5.13 cm2
Calc EF: 48.5 %
Height: 71 in
MV M vel: 3.3 m/s
MV Peak grad: 43.6 mmHg
S' Lateral: 3.3 cm
Single Plane A2C EF: 45.6 %
Single Plane A4C EF: 48.5 %
Weight: 2532.64 oz

## 2022-09-08 LAB — CBC WITH DIFFERENTIAL/PLATELET
Abs Immature Granulocytes: 0.1 10*3/uL — ABNORMAL HIGH (ref 0.00–0.07)
Basophils Absolute: 0.1 10*3/uL (ref 0.0–0.1)
Basophils Relative: 0 %
Eosinophils Absolute: 0 10*3/uL (ref 0.0–0.5)
Eosinophils Relative: 0 %
HCT: 37.5 % — ABNORMAL LOW (ref 39.0–52.0)
Hemoglobin: 12.8 g/dL — ABNORMAL LOW (ref 13.0–17.0)
Immature Granulocytes: 1 %
Lymphocytes Relative: 3 %
Lymphs Abs: 0.4 10*3/uL — ABNORMAL LOW (ref 0.7–4.0)
MCH: 30.1 pg (ref 26.0–34.0)
MCHC: 34.1 g/dL (ref 30.0–36.0)
MCV: 88.2 fL (ref 80.0–100.0)
Monocytes Absolute: 0.5 10*3/uL (ref 0.1–1.0)
Monocytes Relative: 4 %
Neutro Abs: 12.6 10*3/uL — ABNORMAL HIGH (ref 1.7–7.7)
Neutrophils Relative %: 92 %
Platelets: 172 10*3/uL (ref 150–400)
RBC: 4.25 MIL/uL (ref 4.22–5.81)
RDW: 12.5 % (ref 11.5–15.5)
WBC: 13.7 10*3/uL — ABNORMAL HIGH (ref 4.0–10.5)
nRBC: 0 % (ref 0.0–0.2)

## 2022-09-08 LAB — BASIC METABOLIC PANEL
Anion gap: 13 (ref 5–15)
BUN: 25 mg/dL — ABNORMAL HIGH (ref 8–23)
CO2: 24 mmol/L (ref 22–32)
Calcium: 7.2 mg/dL — ABNORMAL LOW (ref 8.9–10.3)
Chloride: 99 mmol/L (ref 98–111)
Creatinine, Ser: 1.13 mg/dL (ref 0.61–1.24)
GFR, Estimated: >60 mL/min (ref >60)
Glucose, Bld: 152 mg/dL — ABNORMAL HIGH (ref 70–99)
Potassium: 3.7 mmol/L (ref 3.5–5.1)
Sodium: 136 mmol/L (ref 135–145)

## 2022-09-08 LAB — TYPE AND SCREEN
ABO/RH(D): A POS
Antibody Screen: NEGATIVE

## 2022-09-08 LAB — PROTIME-INR
INR: 1 (ref 0.8–1.2)
Prothrombin Time: 13.2 seconds (ref 11.4–15.2)

## 2022-09-08 LAB — BRAIN NATRIURETIC PEPTIDE: B Natriuretic Peptide: 507.3 pg/mL — ABNORMAL HIGH (ref 0.0–100.0)

## 2022-09-08 LAB — ABO/RH: ABO/RH(D): A POS

## 2022-09-08 MED ORDER — HYDROMORPHONE HCL 1 MG/ML IJ SOLN
0.5000 mg | INTRAMUSCULAR | Status: DC | PRN
  Administered 2022-09-08 – 2022-09-09 (×7): 0.5 mg via INTRAVENOUS
  Filled 2022-09-08 (×2): qty 0.5
  Filled 2022-09-08: qty 1
  Filled 2022-09-08: qty 0.5
  Filled 2022-09-08 (×2): qty 1
  Filled 2022-09-08: qty 0.5

## 2022-09-08 MED ORDER — HYDRALAZINE HCL 20 MG/ML IJ SOLN: 10.0000 mg | Freq: Four times a day (QID) | INTRAMUSCULAR | Status: DC | PRN

## 2022-09-08 MED ORDER — LOSARTAN POTASSIUM 25 MG PO TABS
25.0000 mg | ORAL_TABLET | Freq: Every day | ORAL | Status: DC
  Administered 2022-09-08: 25 mg via ORAL
  Filled 2022-09-08: qty 1

## 2022-09-08 MED ORDER — TAMSULOSIN HCL 0.4 MG PO CAPS: 0.4000 mg | ORAL_CAPSULE | Freq: Every day | ORAL | Status: DC

## 2022-09-08 MED ORDER — OXYCODONE HCL 5 MG PO TABS
5.0000 mg | ORAL_TABLET | ORAL | Status: DC | PRN
  Administered 2022-09-08 – 2022-09-09 (×5): 5 mg via ORAL
  Filled 2022-09-08 (×5): qty 1

## 2022-09-08 MED ORDER — FENTANYL CITRATE PF 50 MCG/ML IJ SOSY
100.0000 ug | PREFILLED_SYRINGE | INTRAMUSCULAR | Status: AC | PRN
  Administered 2022-09-08 (×2): 100 ug via INTRAVENOUS
  Filled 2022-09-08 (×2): qty 2

## 2022-09-08 MED ORDER — SENNOSIDES-DOCUSATE SODIUM 8.6-50 MG PO TABS
1.0000 | ORAL_TABLET | Freq: Two times a day (BID) | ORAL | Status: DC
  Administered 2022-09-09 – 2022-09-12 (×4): 1 via ORAL
  Filled 2022-09-08 (×7): qty 1

## 2022-09-08 MED ORDER — AMITRIPTYLINE HCL 50 MG PO TABS
50.0000 mg | ORAL_TABLET | Freq: Every day | ORAL | Status: DC
  Administered 2022-09-08 – 2022-09-11 (×4): 50 mg via ORAL
  Filled 2022-09-08 (×3): qty 1
  Filled 2022-09-08: qty 2

## 2022-09-08 MED ORDER — AMLODIPINE BESYLATE 5 MG PO TABS
5.0000 mg | ORAL_TABLET | Freq: Every day | ORAL | Status: DC
  Administered 2022-09-08: 5 mg via ORAL
  Filled 2022-09-08: qty 1

## 2022-09-08 MED ORDER — METHOCARBAMOL 1000 MG/10ML IJ SOLN
500.0000 mg | Freq: Three times a day (TID) | INTRAVENOUS | Status: DC | PRN
  Administered 2022-09-08 – 2022-09-11 (×5): 500 mg via INTRAVENOUS
  Filled 2022-09-08 (×4): qty 500

## 2022-09-08 MED ORDER — PERFLUTREN LIPID MICROSPHERE
1.0000 mL | INTRAVENOUS | Status: AC | PRN
  Administered 2022-09-08: 2 mL via INTRAVENOUS

## 2022-09-08 MED ORDER — FUROSEMIDE 10 MG/ML IJ SOLN
40.0000 mg | Freq: Once | INTRAMUSCULAR | Status: AC
  Administered 2022-09-08: 40 mg via INTRAVENOUS
  Filled 2022-09-08: qty 4

## 2022-09-08 MED ORDER — ONDANSETRON HCL 4 MG/2ML IJ SOLN
4.0000 mg | Freq: Once | INTRAMUSCULAR | Status: AC
  Administered 2022-09-08: 4 mg via INTRAVENOUS
  Filled 2022-09-08: qty 2

## 2022-09-08 MED ORDER — POLYETHYLENE GLYCOL 3350 17 G PO PACK
17.0000 g | PACK | Freq: Every day | ORAL | Status: DC
  Administered 2022-09-08 – 2022-09-12 (×2): 17 g via ORAL
  Filled 2022-09-08 (×3): qty 1

## 2022-09-08 MED ORDER — METOPROLOL TARTRATE 25 MG PO TABS
25.0000 mg | ORAL_TABLET | Freq: Two times a day (BID) | ORAL | Status: DC
  Administered 2022-09-08 – 2022-09-09 (×2): 25 mg via ORAL
  Filled 2022-09-08 (×2): qty 1

## 2022-09-08 MED ORDER — PANTOPRAZOLE SODIUM 40 MG PO TBEC
40.0000 mg | DELAYED_RELEASE_TABLET | Freq: Every day | ORAL | Status: DC
  Administered 2022-09-08 – 2022-09-12 (×4): 40 mg via ORAL
  Filled 2022-09-08 (×4): qty 1

## 2022-09-08 MED ORDER — MORPHINE SULFATE (PF) 2 MG/ML IV SOLN: 2.0000 mg | INTRAVENOUS | Status: DC | PRN

## 2022-09-08 MED ORDER — SODIUM CHLORIDE 0.9 % IV SOLN: INTRAVENOUS | Status: DC

## 2022-09-08 NOTE — ED Triage Notes (Addendum)
Pt BIB EMS with reports of a fall, pt injured his left hip. Pt given 200 mcg of Fentanyl.

## 2022-09-08 NOTE — H&P (Addendum)
History and Physical    Perry Rosales [id number] DOB: 31-May-1936 DOA: 09/08/2022  PCP: Donnajean Lopes, MD   Patient coming from: Home  Chief Complaint: Fall, left hip pain  HPI: Perry Rosales is a 87 y.o. male with medical history significant of hypertension, GERD, BPH who lives with his demented wife, ambulates without any problem at home was brought to the emergency department today after he was found on the floor.  Patient usually sleeps on the chair so that he can check his wife at night.  Last night, he got out of the chair and was going to the bathroom, he lost his balance and fell on his left side.  The light in the room was dim.  He landed on the  floor, immediately developed pain on the left hip,unable to get up.  No report of head injury.   On presentation, he was hypertensive and in mild sinus tachycardia.  He was complaining of pain on the left hip.  X-ray of the hips showed acute subcapital left femoral neck fracture.  Orthopedics consulted, admitted with plan for ORIF. Patient seen and examined at bedside in the emergency department.  His son was at the bedside.  He was complaining of pain on the left hip. There was no report of fever, chills, chest pain, shortness of breath, cough, nausea, vomiting, hematochezia or melena.  Patient reported that he had chronic bilateral lower EXTR edema  ED Course: Hypertensive and in mild sinus tachycardia on presentation.  Lab work showed WBC of 13.7.  Review of Systems: As per HPI otherwise 10 point review of systems negative.    Past Medical History:  Diagnosis Date   GERD (gastroesophageal reflux disease)    Heart murmur    Hypertension     Past Surgical History:  Procedure Laterality Date   CATARACT EXTRACTION W/ INTRAOCULAR LENS  IMPLANT, BILATERAL     CHOLECYSTECTOMY     COLONOSCOPY       reports that he has been smoking cigarettes. He has a 30.00 pack-year smoking history. He has been exposed to tobacco smoke. He  has never used smokeless tobacco. He reports that he does not drink alcohol and does not use drugs.  No Known Allergies  Family History  Problem Relation Age of Onset   Colon cancer Neg Hx    Esophageal cancer Neg Hx    Rectal cancer Neg Hx    Stomach cancer Neg Hx      Prior to Admission medications   Medication Sig Start Date End Date Taking? Authorizing Provider  amitriptyline (ELAVIL) 50 MG tablet Take 50 mg by mouth at bedtime.    [provider]  amLODipine (NORVASC) 5 MG tablet Take 1 tablet (5 mg total) by mouth daily. 02/12/21 02/12/22  Darliss Cheney, MD  aspirin 81 MG tablet Take 81 mg by mouth daily.    [provider]  omeprazole (PRILOSEC) 20 MG capsule Take 20 mg by mouth daily.    [provider]  tamsulosin (FLOMAX) 0.4 MG CAPS capsule Take 0.4 mg by mouth in the morning and at bedtime.    [provider]  traMADol-acetaminophen (ULTRACET) 37.5-325 MG per tablet Take 1 tablet by mouth See admin instructions. 1 tablet in the morning, 2 tablets at lunch, and 2 tablets at dinner.    [provider]    Physical Exam: Vitals:   09/08/22 0700 09/08/22 0800 09/08/22 0947 09/08/22 1200  BP: (!) 163/75 (!) 153/72  (!) 138/56  Pulse: (!) 108 (!) 111  (!) 102  Resp: 20 (!) 26  (!) 25  Temp:   97.9 F (36.6 C)   TempSrc:   Oral   SpO2: 94% 93%  95%  Weight:      Height:        Constitutional: NAD, calm, comfortable, pleasant elderly male Vitals:   09/08/22 0700 09/08/22 0800 09/08/22 0947 09/08/22 1200  BP: (!) 163/75 (!) 153/72  (!) 138/56  Pulse: (!) 108 (!) 111  (!) 102  Resp: 20 (!) 26  (!) 25  Temp:   97.9 F (36.6 C)   TempSrc:   Oral   SpO2: 94% 93%  95%  Weight:      Height:       Eyes: PERRL, lids and conjunctivae normal ENMT: Mucous membranes are moist.  Neck: normal, supple, no masses, no thyromegaly Respiratory: clear to auscultation bilaterally, no wheezing, no crackles. Normal respiratory effort. No  accessory muscle use.  Cardiovascular: Regular rate and rhythm, no murmurs / rubs / gallops. No extremity edema.  Abdomen: no tenderness, no masses palpated. No hepatosplenomegaly. Bowel sounds positive.  Musculoskeletal: no clubbing / cyanosis. No joint deformity upper and lower extremities.  Tenderness with decreased range of motion of the left hip, 2-3+ bilateral lower extremity pitting edema Skin: no rashes, lesions, ulcers. No induration Neurologic: CN 2-12 grossly intact.  Strength 5/5 in all 4.  Psychiatric: Normal judgment and insight. Alert and oriented x 3. Normal mood.   Foley Catheter:None  Labs on Admission: I have personally reviewed following labs and imaging studies  CBC: Recent Labs  Lab 09/08/22 0650  WBC 13.7*  NEUTROABS 12.6*  HGB 12.8*  HCT 37.5*  MCV 88.2  PLT [id number]   Basic Metabolic Panel: Recent Labs  Lab 09/08/22 0650  NA 136  K 3.7  CL 99  CO2 24  GLUCOSE 152*  BUN 25*  CREATININE 1.13  CALCIUM 7.2*   GFR: Estimated Creatinine Clearance: 49.4 mL/min (by C-G formula based on SCr of 1.13 mg/dL). Liver Function Tests: No results for input(s): "AST", "ALT", "ALKPHOS", "BILITOT", "PROT", "ALBUMIN" in the last 168 hours. No results for input(s): "LIPASE", "AMYLASE" in the last 168 hours. No results for input(s): "AMMONIA" in the last 168 hours. Coagulation Profile: Recent Labs  Lab 09/08/22 0650  INR 1.0   Cardiac Enzymes: No results for input(s): "CKTOTAL", "CKMB", "CKMBINDEX", "TROPONINI" in the last 168 hours. BNP (last 3 results) No results for input(s): "PROBNP" in the last 8760 hours. HbA1C: No results for input(s): "HGBA1C" in the last 72 hours. CBG: No results for input(s): "GLUCAP" in the last 168 hours. Lipid Profile: No results for input(s): "CHOL", "HDL", "LDLCALC", "TRIG", "CHOLHDL", "LDLDIRECT" in the last 72 hours. Thyroid Function Tests: No results for input(s): "TSH", "T4TOTAL", "FREET4", "T3FREE", "THYROIDAB" in the last  72 hours. Anemia Panel: No results for input(s): "VITAMINB12", "FOLATE", "FERRITIN", "TIBC", "IRON", "RETICCTPCT" in the last 72 hours. Urine analysis:    Component Value Date/Time   COLORURINE ORANGE (A) 02/09/2021 0930   APPEARANCEUR HAZY (A) 02/09/2021 0930   LABSPEC 1.020 02/09/2021 0930   PHURINE 6.0 02/09/2021 0930   GLUCOSEU NEGATIVE 02/09/2021 0930   HGBUR SMALL (A) 02/09/2021 0930   BILIRUBINUR MODERATE (A) 02/09/2021 0930   KETONESUR NEGATIVE 02/09/2021 0930   PROTEINUR 30 (A) 02/09/2021 0930   NITRITE POSITIVE (A) 02/09/2021 0930   LEUKOCYTESUR LARGE (A) 02/09/2021 0930    Radiological Exams on Admission: DG Chest [facility] 1 View  Result  Date: 09/08/2022 CLINICAL DATA:  Pain and shortness of breath.  Hypertension. EXAM: PORTABLE CHEST 1 VIEW COMPARISON:  CT 12/05/2021 FINDINGS: Normal heart size. Aortic atherosclerotic calcifications. No pleural fluid or airspace disease. The visualized osseous structures are unremarkable. IMPRESSION: No active disease. Electronically Signed   By: Kerby Moors M.D.   On: 09/08/2022 07:22   DG Hip Unilat W or Wo Pelvis 2-3 Views Left  Result Date: 09/08/2022 CLINICAL DATA:  Status post fall. EXAM: DG HIP (WITH OR WITHOUT PELVIS) 2-3V LEFT COMPARISON:  None Available. FINDINGS: There is an acute fracture deformity involving the subcapital left femoral neck. Superior displacement of the distal fracture fragments identified. Right hip intact. IMPRESSION: Acute subcapital left femoral neck fracture. Electronically Signed   By: Kerby Moors M.D.   On: 09/08/2022 05:47     Assessment/Plan Principal Problem:   Closed left hip fracture (HCC) Active Problems:   Benign prostatic hyperplasia with lower urinary tract symptoms   Essential hypertension   Gastro-esophageal reflux disease without esophagitis   Leg edema   Closed left hip fracture: [place] on the floor, developed left hip pain.  Ambulates normally at baseline. X-ray of the hips showed  acute subcapital left femoral neck fracture. Orthopedics already consulted, plan for  ORIF tomorrow.  N.p.o. after midnight.  SCD for DVT prophylaxis for now. PT/OT evaluation after surgery.  Continue supportive care, pain management.  Bilateral lower edema/?  Diastolic CHF: Last echo done on 01/2021 showed EF of 123456, grade 1 diastolic dysfunction. Patient has bilateral lower extremity edema, ongoing for last several months.  Will check an echocardiogram.  The patient has edema, but he looks dehydrated with dry mouth.  Continue gentle IV fluids . Will discontinue amlodipine  Hypertension/sinus tachycardia: Most likely contributed by pain.  He was on  amlodipine.Changed to losartan.  Continue as needed medications for severe hypertension.  Monitor on telemetry  History of BPH: Takes tamsulosin at home  GERD: Continue PPI  Addendum: Echo showed EF of 40-45%,regional wall motion abnormality.Started metoprolol,losartan. Will give a dose of lasix iv 40 mg once.Cardiology consulted.     Severity of Illness: The appropriate patient status for this patient is INPATIENT.   DVT prophylaxis: SCD Code Status: Full Family Communication: Son at bedside Consults called: Shaune Leeks MD [facility]  09/08/2022, 1:28 PM

## 2022-09-08 NOTE — ED Provider Notes (Signed)
[name] EMERGENCY DEPARTMENT AT [facility] Provider Note   CSN: [id number] Arrival date & time: 09/08/22  0455     History  Chief Complaint  Patient presents with   [name]    Perry Rosales is a 87 y.o. male.  The history is provided by the patient.  Patient with history of hypertension presents after fall.  Patient reports he got up in the night to help with his wife.  He reports he lost his balance and landed on his left arm and left hip.  No head injury or LOC.  He is not on anticoagulation. He reports significant pain in his left hip and is unable to walk. He had otherwise been at his baseline.  No recent illnesses. He does not have a local orthopedist   Past Medical History:  Diagnosis Date   GERD (gastroesophageal reflux disease)    Heart murmur    Hypertension     Home Medications Prior to Admission medications   Medication Sig Start Date End Date Taking? Authorizing Provider  amitriptyline (ELAVIL) 50 MG tablet Take 50 mg by mouth at bedtime.    [provider]  amLODipine (NORVASC) 5 MG tablet Take 1 tablet (5 mg total) by mouth daily. 02/12/21 02/12/22  Darliss Cheney, MD  aspirin 81 MG tablet Take 81 mg by mouth daily.    [provider]  omeprazole (PRILOSEC) 20 MG capsule Take 20 mg by mouth daily.    [provider]  tamsulosin (FLOMAX) 0.4 MG CAPS capsule Take 0.4 mg by mouth in the morning and at bedtime.    [provider]  traMADol-acetaminophen (ULTRACET) 37.5-325 MG per tablet Take 1 tablet by mouth See admin instructions. 1 tablet in the morning, 2 tablets at lunch, and 2 tablets at dinner.    [provider]      Allergies    Patient has no known allergies.    Review of Systems   Review of Systems  Gastrointestinal:  Negative for diarrhea and vomiting.  Musculoskeletal:  Positive for arthralgias. Negative for back pain and neck pain.    Physical Exam Updated Vital Signs BP (!) 163/75    Pulse (!) 108   Temp 98.8 F (37.1 C) (Oral)   Resp 20   Ht 1.803 m ('5\' 11"'$ )   Wt 71.8 kg   SpO2 94%   BMI 22.08 kg/m  Physical Exam CONSTITUTIONAL: Elderly, uncomfortable appearing HEAD: Normocephalic/atraumatic EYES: EOMI/PERRL ENMT: Mucous membranes moist NECK: supple no meningeal signs SPINE/BACK:entire spine nontender No bruising/crepitance/stepoffs noted to spine CV: S1/S2 noted, no murmurs/rubs/gallops noted LUNGS: Lungs are clear to auscultation bilaterally, no apparent distress ABDOMEN: soft, nontender NEURO: Pt is awake/alert/appropriate, moves all extremitiesx4.  No facial droop.   EXTREMITIES: Left lower extremity is shortened and rotated.  Tenderness with range of motion of left hip.  Distal pulses intact. Patient noted to left elbow, but no tenderness, he has full range of motion. All other extremities/joints palpated/ranged and nontender SKIN: warm, color normal PSYCH: no abnormalities of mood noted, alert and oriented to situation  ED Results / Procedures / Treatments   Labs (all labs ordered are listed, but only abnormal results are displayed) Labs Reviewed  BASIC METABOLIC PANEL - Abnormal; Notable for the following components:      Result Value   Glucose, Bld 152 (*)    BUN 25 (*)    Calcium 7.2 (*)    All other components within normal limits  CBC WITH DIFFERENTIAL/PLATELET -  Abnormal; Notable for the following components:   WBC 13.7 (*)    Hemoglobin 12.8 (*)    HCT 37.5 (*)    Neutro Abs 12.6 (*)    Lymphs Abs 0.4 (*)    Abs Immature Granulocytes 0.10 (*)    All other components within normal limits  PROTIME-INR  TYPE AND SCREEN    EKG EKG Interpretation  Date/Time:  Tuesday September 08 2022 06:38:24 EDT Ventricular Rate:  110 PR Interval:  144 QRS Duration: 107 QT Interval:  375 QTC Calculation: 508 R Axis:   106 Text Interpretation: Sinus tachycardia Right axis deviation Borderline ST depression, diffuse leads Interpretation limited  secondary to artifact Confirmed by Ripley Fraise [id number]) on 09/08/2022 6:53:44 AM  Radiology DG Chest Port 1 View  Result Date: 09/08/2022 CLINICAL DATA:  Pain and shortness of breath.  Hypertension. EXAM: PORTABLE CHEST 1 VIEW COMPARISON:  CT 12/05/2021 FINDINGS: Normal heart size. Aortic atherosclerotic calcifications. No pleural fluid or airspace disease. The visualized osseous structures are unremarkable. IMPRESSION: No active disease. Electronically Signed   By: Kerby Moors M.D.   On: 09/08/2022 07:22   DG Hip Unilat W or Wo Pelvis 2-3 Views Left  Result Date: 09/08/2022 CLINICAL DATA:  Status post fall. EXAM: DG HIP (WITH OR WITHOUT PELVIS) 2-3V LEFT COMPARISON:  None Available. FINDINGS: There is an acute fracture deformity involving the subcapital left femoral neck. Superior displacement of the distal fracture fragments identified. Right hip intact. IMPRESSION: Acute subcapital left femoral neck fracture. Electronically Signed   By: Kerby Moors M.D.   On: 09/08/2022 05:47    Procedures Procedures    Medications Ordered in ED Medications  fentaNYL (SUBLIMAZE) injection 100 mcg (100 mcg Intravenous Given 09/08/22 0634)  ondansetron (ZOFRAN) injection 4 mg (4 mg Intravenous Given 09/08/22 [id number])    ED Course/ Medical Decision Making/ A&P Clinical Course as of 09/08/22 0739  Tue Sep 08, 2022  0653 Patient with accidental fall at home and sustained isolated left hip fracture.  No other signs of acute traumatic injury.  At his request, have updated his son [name] at [phone] [DW]  0729 Discussed with Dr. Lucia Gaskins with orthopedics.  He will see the patient [DW]  [phone] Discussed with hospitalist for admission. [DW]    Clinical Course User Index [DW] Ripley Fraise, MD                             Medical Decision Making Amount and/or Complexity of Data Reviewed Labs: ordered. Radiology: ordered.  Risk Prescription drug management. Decision regarding  hospitalization.   This patient presents to the ED for concern of hip pain, this involves an extensive number of treatment options, and is a complaint that carries with it a high risk of complications and morbidity.  The differential diagnosis includes but is not limited to hip fracture, hip dislocation, muscle strain  Comorbidities that complicate the patient evaluation: Patient's presentation is complicated by their history of hypertension  Social Determinants of Health: Patient cares for his wife who has dementia  Additional history obtained: Records reviewed previous admission documents  Lab Tests: I Ordered, and personally interpreted labs.  The pertinent results include: hyperglycemia  Imaging Studies ordered: I ordered imaging studies including X-ray left hip   I independently visualized and interpreted imaging which showed femoral neck fracture I agree with the radiologist interpretation   Medicines ordered and prescription drug management: I ordered medication including fentanyl for pain Reevaluation of  the patient after these medicines showed that the patient    stayed the same   Critical Interventions:   admit for operative repair  Consultations Obtained: I requested consultation with the consultant Dr. Lucia Gaskins , and discussed  findings as well as pertinent plan - they recommend: Mid to hospitalist, plan for operative management  Reevaluation: After the interventions noted above, I reevaluated the patient and found that they have :stayed the same  Complexity of problems addressed: Patient's presentation is most consistent with  acute presentation with potential threat to life or bodily function  Disposition: After consideration of the diagnostic results and the patient's response to treatment,  I feel that the patent would benefit from admission   .           Final Clinical Impression(s) / ED Diagnoses Final diagnoses:  Closed fracture of left hip,  initial encounter [facility])    Rx / [place] Orders ED Discharge Orders     None         Ripley Fraise, MD 09/08/22 [phone]

## 2022-09-08 NOTE — Progress Notes (Signed)
  Echocardiogram 2D Echocardiogram has been performed.  Perry Rosales 09/08/2022, 3:40 PM

## 2022-09-08 NOTE — ED Notes (Signed)
ED TO INPATIENT HANDOFF REPORT  Name/Age/Gender Perry Rosales 87 y.o. male  Code Status    Code Status Orders  (From admission, onward)           Start     Ordered   09/08/22 0741  Full code  Continuous       Question:  By:  Answer:  Consent: discussion documented in EHR   09/08/22 0741           Code Status History     Date Active Date Inactive Code Status Order ID Comments User Context   02/10/2021 1256 02/12/2021 1910 Full Code [id number]  Lequita Halt, MD Inpatient       Home/SNF/Other Home  Chief Complaint Closed left hip fracture ([place]) [S72.002A]  Level of Care/Admitting Diagnosis ED Disposition     ED Disposition  Admit   Condition  --   [facility] Area: [place] [[id number]]  Level of Care: Telemetry [5]  Admit to tele based on following criteria: Monitor QTC interval  May admit patient to [facility] or [facility] if equivalent level of care is available:: No  Covid Evaluation: Confirmed COVID Negative  Diagnosis: Closed left hip fracture [facility]) [id number]  Admitting Physician: Shelly Coss [id number]  Attending Physician: Shelly Coss [id number]  Certification:: I certify this patient will need inpatient services for at least 2 midnights          Medical History Past Medical History:  Diagnosis Date   GERD (gastroesophageal reflux disease)    Heart murmur    Hypertension     Allergies No Known Allergies  IV Location/Drains/Wounds Patient Lines/Drains/Airways Status     Active Line/Drains/Airways     Name Placement date Placement time Site Days   Peripheral IV 09/08/22 22 G Posterior;Right Hand 09/08/22  0508  Hand  less than 1   Urethral Catheter  Latex 16 Fr. 02/09/21  0930  Latex  576            Labs/Imaging Results for orders placed or performed during the hospital encounter of 09/08/22 (from the past 48 hour(s))  Basic metabolic panel     Status: Abnormal   Collection Time: 09/08/22   6:50 AM  Result Value Ref Range   Sodium 136 135 - 145 mmol/L   Potassium 3.7 3.5 - 5.1 mmol/L   Chloride 99 98 - 111 mmol/L   CO2 24 22 - 32 mmol/L   Glucose, Bld 152 (H) 70 - 99 mg/dL    Comment: Glucose reference range applies only to samples taken after fasting for at least 8 hours.   BUN 25 (H) 8 - 23 mg/dL   Creatinine, Ser 1.13 0.61 - 1.24 mg/dL   Calcium 7.2 (L) 8.9 - 10.3 mg/dL   GFR, Estimated >60 >60 mL/min    Comment: (NOTE) Calculated using the CKD-EPI Creatinine Equation (2021)    Anion gap 13 5 - 15    Comment: Performed at [facility], [place] [address], [place], [place] [address]  CBC with Differential     Status: Abnormal   Collection Time: 09/08/22  6:50 AM  Result Value Ref Range   WBC 13.7 (H) 4.0 - 10.5 K/uL   RBC 4.25 4.22 - 5.81 MIL/uL   Hemoglobin 12.8 (L) 13.0 - 17.0 g/dL   HCT 37.5 (L) 39.0 - 52.0 %   MCV 88.2 80.0 - 100.0 fL   MCH 30.1 26.0 - 34.0 pg   MCHC 34.1 30.0 -  36.0 g/dL   RDW 12.5 11.5 - 15.5 %   Platelets 172 150 - 400 K/uL   nRBC 0.0 0.0 - 0.2 %   Neutrophils Relative % 92 %   Neutro Abs 12.6 (H) 1.7 - 7.7 K/uL   Lymphocytes Relative 3 %   Lymphs Abs 0.4 (L) 0.7 - 4.0 K/uL   Monocytes Relative 4 %   Monocytes Absolute 0.5 0.1 - 1.0 K/uL   Eosinophils Relative 0 %   Eosinophils Absolute 0.0 0.0 - 0.5 K/uL   Basophils Relative 0 %   Basophils Absolute 0.1 0.0 - 0.1 K/uL   Immature Granulocytes 1 %   Abs Immature Granulocytes 0.10 (H) 0.00 - 0.07 K/uL    Comment: Performed at [facility], [place] [address] [place], [place] [address]  Protime-INR     Status: None   Collection Time: 09/08/22  6:50 AM  Result Value Ref Range   Prothrombin Time 13.2 11.4 - 15.2 seconds   INR 1.0 0.8 - 1.2    Comment: (NOTE) INR goal varies based on device and disease states. Performed at [facility], [place] [address] [place], [place] [address]   Type and screen [place]      Status: None   Collection Time: 09/08/22  6:50 AM  Result Value Ref Range   ABO/RH(D) A POS    Antibody Screen NEG    Sample Expiration      09/11/2022,2359 Performed at [facility], [place] [address], [place], [place] [address]    ECHOCARDIOGRAM COMPLETE  Result Date: 09/08/2022    ECHOCARDIOGRAM REPORT   Patient Name:   Perry Rosales Date of Exam: 09/08/2022 Medical Rec #:  [id number]      Height:       71.0 in Accession #:    [id number]     Weight:       158.3 lb Date of Birth:  1936-06-29      BSA:          1.909 m Patient Age:    81 years       BP:           143/89 mmHg Patient Gender: M              HR:           106 bpm. Exam Location:  Inpatient Procedure: 2D Echo and Intracardiac Opacification Agent Indications:    CHF  History:        Patient has prior history of Echocardiogram examinations, most                 recent 02/10/2021. Risk Factors:Current Smoker and Hypertension.  Sonographer:    Harvie Junior Referring Phys: [id number] AMRIT ADHIKARI  Sonographer Comments: Technically difficult study due to poor echo windows. Image acquisition challenging due to respiratory motion and positioning. IMPRESSIONS  1. Left ventricular ejection fraction, by estimation, is 40 to 45%. The left ventricle has mildly decreased function. The left ventricle demonstrates regional wall motion abnormalities (LAD territory vs stress cardiomyopathy). Left ventricular diastolic  parameters are consistent with Grade I diastolic dysfunction (impaired relaxation).  2. Right ventricular systolic function is normal. The right ventricular size is normal. There is moderately elevated pulmonary artery systolic pressure.  3. The mitral valve is grossly normal. No evidence of mitral valve regurgitation. No evidence of mitral stenosis.  4. The aortic valve is calcified. There is mild calcification of the aortic valve. There  is mild thickening of the aortic valve. Aortic valve regurgitation is not visualized. No  aortic stenosis is present. Comparison(s): Prior images reviewed side by side. Wall motion chagnes new from 2022; both studies does with echo-contrast. FINDINGS  Left Ventricle: Left ventricular ejection fraction, by estimation, is 40 to 45%. The left ventricle has mildly decreased function. The left ventricle demonstrates regional wall motion abnormalities. Definity contrast agent was given IV to delineate the left ventricular endocardial borders. 3D left ventricular ejection fraction analysis performed but not reported based on interpreter judgement due to suboptimal tracking. The left ventricular internal cavity size was normal in size. There is no left ventricular hypertrophy. Left ventricular diastolic parameters are consistent with Grade I diastolic dysfunction (impaired relaxation).  LV Wall Scoring: The apex is akinetic. The apical lateral segment, apical septal segment, apical anterior segment, and apical inferior segment are hypokinetic. Right Ventricle: The right ventricular size is normal. No increase in right ventricular wall thickness. Right ventricular systolic function is normal. There is moderately elevated pulmonary artery systolic pressure. The tricuspid regurgitant velocity is 3.34 m/s, and with an assumed right atrial pressure of 3 mmHg, the estimated right ventricular systolic pressure is 0000000 mmHg. Left Atrium: Left atrial size was normal in size. Right Atrium: Right atrial size was normal in size. Pericardium: There is no evidence of pericardial effusion. Mitral Valve: The mitral valve is grossly normal. No evidence of mitral valve regurgitation. No evidence of mitral valve stenosis. Tricuspid Valve: The tricuspid valve is not well visualized. Tricuspid valve regurgitation is not demonstrated. No evidence of tricuspid stenosis. Aortic Valve: The aortic valve is calcified. There is mild calcification of the aortic valve. There is mild thickening of the aortic valve. There is mild aortic valve  annular calcification. Aortic valve regurgitation is not visualized. No aortic stenosis  is present. Aortic valve mean gradient measures 7.0 mmHg. Aortic valve peak gradient measures 12.7 mmHg. Aortic valve area, by VTI measures 1.71 cm. Pulmonic Valve: The pulmonic valve was not well visualized. Pulmonic valve regurgitation is not visualized. No evidence of pulmonic stenosis. Aorta: The aortic root and ascending aorta are structurally normal, with no evidence of dilitation. IAS/Shunts: The interatrial septum appears to be lipomatous. The interatrial septum was not well visualized.  LEFT VENTRICLE PLAX 2D LVIDd:         4.40 cm      Diastology LVIDs:         3.30 cm      LV e' medial:    6.42 cm/s LV PW:         1.00 cm      LV E/e' medial:  6.0 LV IVS:        1.00 cm      LV e' lateral:   10.00 cm/s LVOT diam:     2.20 cm      LV E/e' lateral: 3.9 LV SV:         49 LV SV Index:   25 LVOT Area:     3.80 cm                              3D Volume EF: LV Volumes (MOD)            3D EF:        62 % LV vol d, MOD A2C: 108.0 ml LV EDV:       178 ml LV vol d, MOD A4C: 127.5 ml  LV ESV:       67 ml LV vol s, MOD A2C: 58.8 ml  LV SV:        111 ml LV vol s, MOD A4C: 65.6 ml LV SV MOD A2C:     49.2 ml LV SV MOD A4C:     127.5 ml LV SV MOD BP:      57.8 ml RIGHT VENTRICLE RV Basal diam:  2.80 cm RV Mid diam:    2.10 cm RV S prime:     19.20 cm/s TAPSE (M-mode): 1.9 cm LEFT ATRIUM             Index        RIGHT ATRIUM          Index LA diam:        3.30 cm 1.73 cm/m   RA Area:     9.88 cm LA Vol (A2C):   37.8 ml 19.80 ml/m  RA Volume:   20.30 ml 10.63 ml/m LA Vol (A4C):   37.6 ml 19.70 ml/m LA Biplane Vol: 39.0 ml 20.43 ml/m  AORTIC VALVE                     PULMONIC VALVE AV Area (Vmax):    1.77 cm      PV Vmax:       1.15 m/s AV Area (Vmean):   1.72 cm      PV Peak grad:  5.3 mmHg AV Area (VTI):     1.71 cm AV Vmax:           178.50 cm/s AV Vmean:          121.500 cm/s AV VTI:            0.284 m AV Peak Grad:       12.7 mmHg AV Mean Grad:      7.0 mmHg LVOT Vmax:         83.30 cm/s LVOT Vmean:        54.900 cm/s LVOT VTI:          0.128 m LVOT/AV VTI ratio: 0.45  AORTA Ao Root diam: 3.30 cm Ao Asc diam:  3.70 cm MITRAL VALVE                TRICUSPID VALVE MV Area (PHT): 5.13 cm     TR Peak grad:   44.6 mmHg MV Decel Time: 148 msec     TR Vmax:        334.00 cm/s MR Peak grad: 43.6 mmHg MR Vmax:      330.00 cm/s   SHUNTS MV E velocity: 38.80 cm/s   Systemic VTI:  0.13 m MV A velocity: 105.00 cm/s  Systemic Diam: 2.20 cm MV E/A ratio:  0.37 Rudean Haskell MD Electronically signed by Rudean Haskell MD Signature Date/Time: 09/08/2022/4:21:57 PM    Final    DG Chest Port 1 View  Result Date: 09/08/2022 CLINICAL DATA:  Pain and shortness of breath.  Hypertension. EXAM: PORTABLE CHEST 1 VIEW COMPARISON:  CT 12/05/2021 FINDINGS: Normal heart size. Aortic atherosclerotic calcifications. No pleural fluid or airspace disease. The visualized osseous structures are unremarkable. IMPRESSION: No active disease. Electronically Signed   By: Kerby Moors M.D.   On: 09/08/2022 07:22   DG Hip Unilat W or Wo Pelvis 2-3 Views Left  Result Date: 09/08/2022 CLINICAL DATA:  Status post fall. EXAM: DG HIP (WITH OR WITHOUT PELVIS) 2-3V LEFT COMPARISON:  None Available. FINDINGS: There is  an acute fracture deformity involving the subcapital left femoral neck. Superior displacement of the distal fracture fragments identified. Right hip intact. IMPRESSION: Acute subcapital left femoral neck fracture. Electronically Signed   By: Kerby Moors M.D.   On: 09/08/2022 05:47    Pending Labs Unresulted Labs (From admission, onward)     Start     Ordered   09/09/22 [id number]  Basic metabolic panel  Tomorrow morning,   R        09/08/22 0741   09/09/22 0500  CBC  Tomorrow morning,   R        09/08/22 0741   09/08/22 1753  Brain natriuretic peptide  Add-on,   AD        09/08/22 1752   09/08/22 1000  ABO/Rh  Once,   R        09/08/22  1000            Vitals/Pain Today's Vitals   09/08/22 1200 09/08/22 1222 09/08/22 1410 09/08/22 1600  BP: (!) 138/56   (!) 153/71  Pulse: (!) 102   (!) 109  Resp: (!) 25   (!) 26  Temp:   99.2 F (37.3 C) 98.1 F (36.7 C)  TempSrc:   Oral Oral  SpO2: 95%   98%  Weight:      Height:      PainSc:  6       Isolation Precautions No active isolations  Medications Medications  amitriptyline (ELAVIL) tablet 50 mg (has no administration in time range)  pantoprazole (PROTONIX) EC tablet 40 mg (40 mg Oral Given 09/08/22 0948)  oxyCODONE (Oxy IR/ROXICODONE) immediate release tablet 5 mg (5 mg Oral Given 09/08/22 1624)  polyethylene glycol (MIRALAX / GLYCOLAX) packet 17 g (17 g Oral Given 09/08/22 0948)  hydrALAZINE (APRESOLINE) injection 10 mg (has no administration in time range)  methocarbamol (ROBAXIN) 500 mg in dextrose 5 % 50 mL IVPB (0 mg Intravenous Stopped 09/08/22 1330)  HYDROmorphone (DILAUDID) injection 0.5 mg (0.5 mg Intravenous Given 09/08/22 1829)  senna-docusate (Senokot-S) tablet 1 tablet (1 tablet Oral Not Given 09/08/22 1410)  losartan (COZAAR) tablet 25 mg (25 mg Oral Given 09/08/22 1412)  perflutren lipid microspheres (DEFINITY) IV suspension (2 mLs Intravenous Given 09/08/22 1524)  furosemide (LASIX) injection 40 mg (has no administration in time range)  metoprolol tartrate (LOPRESSOR) tablet 25 mg (has no administration in time range)  fentaNYL (SUBLIMAZE) injection 100 mcg (100 mcg Intravenous Given 09/08/22 0825)  ondansetron (ZOFRAN) injection 4 mg (4 mg Intravenous Given 09/08/22 0642)    Mobility non-ambulatory

## 2022-09-09 ENCOUNTER — Encounter (HOSPITAL_COMMUNITY): Payer: Self-pay | Admitting: Internal Medicine

## 2022-09-09 ENCOUNTER — Encounter (HOSPITAL_COMMUNITY): Payer: Self-pay | Admitting: Anesthesiology

## 2022-09-09 ENCOUNTER — Encounter (HOSPITAL_COMMUNITY)
Admission: EM | Disposition: A | Discharge status: Rehabilitation Facility | Payer: Self-pay | Source: Home / Self Care | Attending: Family Medicine

## 2022-09-09 ENCOUNTER — Inpatient Hospital Stay (HOSPITAL_COMMUNITY): Payer: Medicare Other | Admitting: Anesthesiology

## 2022-09-09 ENCOUNTER — Inpatient Hospital Stay (HOSPITAL_COMMUNITY): Payer: Medicare Other

## 2022-09-09 DIAGNOSIS — S72002A Fracture of unspecified part of neck of left femur, initial encounter for closed fracture: Secondary | ICD-10-CM | POA: Diagnosis not present

## 2022-09-09 DIAGNOSIS — I5021 Acute systolic (congestive) heart failure: Secondary | ICD-10-CM | POA: Diagnosis not present

## 2022-09-09 LAB — BASIC METABOLIC PANEL
Anion gap: 13 (ref 5–15)
BUN: 30 mg/dL — ABNORMAL HIGH (ref 8–23)
CO2: 24 mmol/L (ref 22–32)
Calcium: 6.9 mg/dL — ABNORMAL LOW (ref 8.9–10.3)
Chloride: 97 mmol/L — ABNORMAL LOW (ref 98–111)
Creatinine, Ser: 1.63 mg/dL — ABNORMAL HIGH (ref 0.61–1.24)
GFR, Estimated: 41 mL/min — ABNORMAL LOW (ref >60)
Glucose, Bld: 158 mg/dL — ABNORMAL HIGH (ref 70–99)
Potassium: 4 mmol/L (ref 3.5–5.1)
Sodium: 134 mmol/L — ABNORMAL LOW (ref 135–145)

## 2022-09-09 LAB — CBC
HCT: 39.2 % (ref 39.0–52.0)
Hemoglobin: 13.1 g/dL (ref 13.0–17.0)
MCH: 30.4 pg (ref 26.0–34.0)
MCHC: 33.4 g/dL (ref 30.0–36.0)
MCV: 91 fL (ref 80.0–100.0)
Platelets: 157 10*3/uL (ref 150–400)
RBC: 4.31 MIL/uL (ref 4.22–5.81)
RDW: 12.4 % (ref 11.5–15.5)
WBC: 13.3 10*3/uL — ABNORMAL HIGH (ref 4.0–10.5)
nRBC: 0 % (ref 0.0–0.2)

## 2022-09-09 SURGERY — ARTHROPLASTY, HIP, TOTAL,POSTERIOR APPROACH
Anesthesia: Choice

## 2022-09-09 MED ORDER — OXYCODONE HCL 5 MG PO TABS
10.0000 mg | ORAL_TABLET | ORAL | Status: DC | PRN
  Administered 2022-09-09: 10 mg via ORAL
  Filled 2022-09-09: qty 2

## 2022-09-09 MED ORDER — CLONIDINE HCL (ANALGESIA) 100 MCG/ML EP SOLN
EPIDURAL | Status: DC | PRN
  Administered 2022-09-09: 80 ug

## 2022-09-09 MED ORDER — DEXAMETHASONE SODIUM PHOSPHATE 4 MG/ML IJ SOLN
INTRAMUSCULAR | Status: DC | PRN
  Administered 2022-09-09: 5 mg via PERINEURAL

## 2022-09-09 MED ORDER — ORAL CARE MOUTH RINSE: 15.0000 mL | OROMUCOSAL | Status: DC | PRN

## 2022-09-09 MED ORDER — ROPIVACAINE HCL 5 MG/ML IJ SOLN
INTRAMUSCULAR | Status: DC | PRN
  Administered 2022-09-09: 30 mL via PERINEURAL

## 2022-09-09 MED ORDER — METOPROLOL SUCCINATE ER 25 MG PO TB24
25.0000 mg | ORAL_TABLET | Freq: Every day | ORAL | Status: DC
  Administered 2022-09-10 – 2022-09-12 (×3): 25 mg via ORAL
  Filled 2022-09-09 (×3): qty 1

## 2022-09-09 MED ORDER — FENTANYL CITRATE PF 50 MCG/ML IJ SOSY
50.0000 ug | PREFILLED_SYRINGE | Freq: Once | INTRAMUSCULAR | Status: AC
  Administered 2022-09-09: 50 ug via INTRAVENOUS

## 2022-09-09 MED ORDER — LIP MEDEX EX OINT
TOPICAL_OINTMENT | CUTANEOUS | Status: DC | PRN
  Administered 2022-09-10: 75 via TOPICAL
  Filled 2022-09-09 (×2): qty 7

## 2022-09-09 MED ORDER — FENTANYL CITRATE PF 50 MCG/ML IJ SOSY
PREFILLED_SYRINGE | INTRAMUSCULAR | Status: AC
  Administered 2022-09-09: 50 ug
  Filled 2022-09-09: qty 1

## 2022-09-09 MED ORDER — CHLORHEXIDINE GLUCONATE 0.12 % MT SOLN
15.0000 mL | Freq: Once | OROMUCOSAL | Status: AC
  Administered 2022-09-09: 15 mL via OROMUCOSAL

## 2022-09-09 MED ORDER — LACTATED RINGERS IV SOLN: INTRAVENOUS | Status: DC

## 2022-09-09 MED ORDER — CHLORHEXIDINE GLUCONATE CLOTH 2 % EX PADS
6.0000 | MEDICATED_PAD | Freq: Every day | CUTANEOUS | Status: DC
  Administered 2022-09-09 – 2022-09-12 (×4): 6 via TOPICAL

## 2022-09-09 MED ORDER — HYDROMORPHONE HCL 1 MG/ML IJ SOLN
0.5000 mg | INTRAMUSCULAR | Status: DC | PRN
  Administered 2022-09-09 – 2022-09-12 (×5): 1 mg via INTRAVENOUS
  Filled 2022-09-09 (×5): qty 1

## 2022-09-09 MED ORDER — PHENOL 1.4 % MT LIQD
1.0000 | OROMUCOSAL | Status: DC | PRN
  Administered 2022-09-10 – 2022-09-11 (×2): 1 via OROMUCOSAL
  Filled 2022-09-09: qty 177

## 2022-09-09 MED ORDER — FENTANYL CITRATE PF 50 MCG/ML IJ SOSY
PREFILLED_SYRINGE | INTRAMUSCULAR | Status: AC
  Administered 2022-09-09: 50 ug
  Filled 2022-09-09: qty 2

## 2022-09-09 MED ORDER — FUROSEMIDE 10 MG/ML IJ SOLN
40.0000 mg | Freq: Two times a day (BID) | INTRAMUSCULAR | Status: DC
  Administered 2022-09-09 – 2022-09-11 (×5): 40 mg via INTRAVENOUS
  Filled 2022-09-09 (×5): qty 4

## 2022-09-09 SURGICAL SUPPLY — 64 items
ADH SKN CLS APL DERMABOND .7 (GAUZE/BANDAGES/DRESSINGS) ×1
APL PRP STRL LF DISP 70% ISPRP (MISCELLANEOUS) ×2
BAG COUNTER SPONGE SURGICOUNT (BAG) IMPLANT
BAG DECANTER FOR FLEXI CONT (MISCELLANEOUS) ×2 IMPLANT
BAG SPEC THK2 15X12 ZIP CLS (MISCELLANEOUS) ×1
BAG SPNG CNTER NS LX DISP (BAG)
BAG ZIPLOCK 12X15 (MISCELLANEOUS) ×2 IMPLANT
BLADE SAW SAG 25X90X1.19 (BLADE) ×2 IMPLANT
CHLORAPREP W/TINT 26 (MISCELLANEOUS) ×4 IMPLANT
COVER SURGICAL LIGHT HANDLE (MISCELLANEOUS) ×2 IMPLANT
DERMABOND ADVANCED .7 DNX12 (GAUZE/BANDAGES/DRESSINGS) ×2 IMPLANT
DRAPE HIP W/POCKET STRL (MISCELLANEOUS) ×2 IMPLANT
DRAPE INCISE IOBAN 66X45 STRL (DRAPES) ×2 IMPLANT
DRAPE INCISE IOBAN 85X60 (DRAPES) ×2 IMPLANT
DRAPE POUCH INSTRU U-SHP 10X18 (DRAPES) ×2 IMPLANT
DRAPE SHEET LG 3/4 BI-LAMINATE (DRAPES) ×6 IMPLANT
DRAPE SURG 17X11 SM STRL (DRAPES) ×2 IMPLANT
DRAPE U-SHAPE 47X51 STRL (DRAPES) ×4 IMPLANT
DRESSING AQUACEL AG SP 3.5X10 (GAUZE/BANDAGES/DRESSINGS) ×2 IMPLANT
DRSG AQUACEL AG SP 3.5X10 (GAUZE/BANDAGES/DRESSINGS) ×1
ELECT BLADE TIP CTD 4 INCH (ELECTRODE) ×2 IMPLANT
ELECT REM PT RETURN 15FT ADLT (MISCELLANEOUS) ×2 IMPLANT
GLOVE BIO SURGEON STRL SZ 6.5 (GLOVE) ×4 IMPLANT
GLOVE BIOGEL PI IND STRL 6.5 (GLOVE) ×2 IMPLANT
GLOVE BIOGEL PI IND STRL 8 (GLOVE) ×2 IMPLANT
GLOVE SURG ORTHO 8.0 STRL STRW (GLOVE) ×4 IMPLANT
GOWN STRL REUS W/ TWL XL LVL3 (GOWN DISPOSABLE) ×4 IMPLANT
GOWN STRL REUS W/TWL XL LVL3 (GOWN DISPOSABLE) ×2
HANDPIECE INTERPULSE COAX TIP (DISPOSABLE)
HOLDER FOLEY CATH W/STRAP (MISCELLANEOUS) ×2 IMPLANT
HOOD PEEL AWAY T7 (MISCELLANEOUS) ×6 IMPLANT
JET LAVAGE IRRISEPT WOUND (IRRIGATION / IRRIGATOR)
KIT BASIN OR (CUSTOM PROCEDURE TRAY) ×2 IMPLANT
KIT TURNOVER KIT A (KITS) IMPLANT
LAVAGE JET IRRISEPT WOUND (IRRIGATION / IRRIGATOR) IMPLANT
MANIFOLD NEPTUNE II (INSTRUMENTS) ×2 IMPLANT
MARKER SKIN DUAL TIP RULER LAB (MISCELLANEOUS) ×2 IMPLANT
NDL HYPO 22X1.5 SAFETY MO (MISCELLANEOUS) IMPLANT
NEEDLE HYPO 22X1.5 SAFETY MO (MISCELLANEOUS) IMPLANT
NEEDLE SAFETY HYPO 22GAX1.5 (MISCELLANEOUS)
NS IRRIG 1000ML POUR BTL (IV SOLUTION) ×2 IMPLANT
PACK TOTAL JOINT (CUSTOM PROCEDURE TRAY) ×2 IMPLANT
PRESSURIZER FEMORAL UNIV (MISCELLANEOUS) IMPLANT
PROTECTOR NERVE ULNAR (MISCELLANEOUS) ×2 IMPLANT
RETRIEVER SUT HEWSON (MISCELLANEOUS) ×2 IMPLANT
SEALER BIPOLAR AQUA 6.0 (INSTRUMENTS) ×2 IMPLANT
SET HNDPC FAN SPRY TIP SCT (DISPOSABLE) IMPLANT
SPIKE FLUID TRANSFER (MISCELLANEOUS) ×6 IMPLANT
SUCTION FRAZIER HANDLE 12FR (TUBING) ×1
SUCTION TUBE FRAZIER 12FR DISP (TUBING) ×2 IMPLANT
SUT BONE WAX W31G (SUTURE) ×2 IMPLANT
SUT ETHIBOND #5 BRAIDED 30INL (SUTURE) ×2 IMPLANT
SUT MNCRL AB 3-0 PS2 18 (SUTURE) ×2 IMPLANT
SUT STRATAFIX 0 PDS 27 VIOLET (SUTURE) ×1
SUT STRATAFIX PDO 1 14 VIOLET (SUTURE) ×1
SUT STRATFX PDO 1 14 VIOLET (SUTURE) ×1
SUT VIC AB 2-0 CT2 27 (SUTURE) ×4 IMPLANT
SUTURE STRATFX 0 PDS 27 VIOLET (SUTURE) ×2 IMPLANT
SUTURE STRATFX PDO 1 14 VIOLET (SUTURE) ×2 IMPLANT
SYR 20ML LL LF (SYRINGE) ×4 IMPLANT
TOWEL OR 17X26 10 PK STRL BLUE (TOWEL DISPOSABLE) ×2 IMPLANT
TRAY FOLEY MTR SLVR 16FR STAT (SET/KITS/TRAYS/PACK) ×2 IMPLANT
UNDERPAD 30X36 HEAVY ABSORB (UNDERPADS AND DIAPERS) ×2 IMPLANT
WATER STERILE IRR 1000ML POUR (IV SOLUTION) ×4 IMPLANT

## 2022-09-09 NOTE — Progress Notes (Signed)
After discussion with anesthesia the case will be postponed until tomorrow.  Plan is for transfer to [facility] to have hip surgery here.  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  His wall motion abnormalities spanned multiple coronary territories.  They do not fit a single distribution.  The patient is without symptoms of chest discomfort.  However he does have coronary calcifications.  His EKG shows diffuse T wave inversions now which is changed from his EKG on admission.  His EKG pattern can also be seen with a stress-induced cardiomyopathy.  Given his lack of symptoms and need for hip surgery, I think the best course of action is to proceed with surgery.  Clearly given his new cardiomyopathy he will be better served at [facility].  Cardiology will follow along while he is here.  We will continue with diuresis today.  We will further titrate medical therapy as we are able.  [place] T. Audie Box, MD, [place]  [address] [place], [place] [address] [phone]  4:14 PM

## 2022-09-09 NOTE — Consult Note (Signed)
Cardiology Consultation   Patient ID: ROY LUNDSTROM MRN: [id number]; DOB: Mar 02, 1936  Admit date: 09/08/2022 Date of Consult: 09/09/2022  PCP:  Donnajean Lopes, [place] [facility] Cardiologist:  Evalina Field, MD   Patient Profile:   SERIGO COUTTS is a 87 y.o. male with a hx of HTN, tobacco abuse, and BPH who is being seen 09/09/2022 for the evaluation of preoperative risk evaluation and new cardiomyopathy at the request of Dr. Tawanna Solo.  History of Present Illness:   Mr. Kintz has no prior cardiac history. He has treated hypertension and BPH. He was hospitalized 01/2021 with urinary retention, UTI, and AKI. Given AKI, which resolved, lisinopril was held and he was discharged on amlodipine. Echocardiogram at that time showed LVEF 60-65%, grade 1 DD, and no significant valvular disease. LE [place] negative for DVT.   CT chest WO 12/05/21 for hx of cigarette smoking demonstrated aortic and multivessel coronary artery atherosclerotic vascular calcifications, calcific aortic valve.    He presented to [facility] via EMS following a fall with left hip pain found to have a subcapital left femoral neck fracture. He was admitted to medicine and orthopedic surgery consulted. Due to lower extremity edema, an echo was obtained that showed LVEF 40-45% with RWMA in the LAD territory vs stress cardiomyopathy, moderate pulmonary hypertension. This represents a new cardiomyopathy and PH.   He has been treated with IV lasix 40 mg x 1 and lopressor.   During my interview, he cares for his wife at home. They live in a one story house. He is able to walk the grocery store and [employer] without angina. He denies prior cardiac history. He states that his legs started swelling about 10 months ago, but this had not been treated. He reports a 20 year smoking history and quit 2 years ago. He sleeps in a recliner in the room with his wife - he can't sleep flat in the recliner, unclear if due to breathing  issues or pain. He is not SOB at rest, no DOE.    Past Medical History:  Diagnosis Date   GERD (gastroesophageal reflux disease)    Heart murmur    Hypertension     Past Surgical History:  Procedure Laterality Date   CATARACT EXTRACTION W/ INTRAOCULAR LENS  IMPLANT, BILATERAL     CHOLECYSTECTOMY     COLONOSCOPY       Home Medications:  Prior to Admission medications   Medication Sig Start Date End Date Taking? Authorizing Provider  amitriptyline (ELAVIL) 50 MG tablet Take 50 mg by mouth at bedtime.   Yes [provider]  amLODipine (NORVASC) 5 MG tablet Take 1 tablet (5 mg total) by mouth daily. 02/12/21 09/08/22 Yes Pahwani, Einar Grad, MD  aspirin 81 MG tablet Take 81 mg by mouth daily.   Yes [provider]  omeprazole (PRILOSEC) 20 MG capsule Take 20 mg by mouth daily.   Yes [provider]  traMADol-acetaminophen (ULTRACET) 37.5-325 MG per tablet Take 1-2 tablets by mouth See admin instructions. 1 tablet in the morning, 1 tablet at lunch, and 2 tablet in the evening   Yes [provider]  tamsulosin (FLOMAX) 0.4 MG CAPS capsule Take 0.4 mg by mouth in the morning and at bedtime. Patient not taking: Reported on 09/08/2022    [provider]    Inpatient Medications: Scheduled Meds:  amitriptyline  50 mg Oral QHS   metoprolol tartrate  25 mg Oral BID   pantoprazole  40 mg Oral Daily   polyethylene glycol  17 g Oral Daily   senna-docusate  1 tablet Oral BID   Continuous Infusions:  methocarbamol (ROBAXIN) IV 500 mg (09/09/22 0548)   PRN Meds: hydrALAZINE, HYDROmorphone (DILAUDID) injection, methocarbamol (ROBAXIN) IV, oxyCODONE  Allergies:   No Known Allergies  Social History:   Social History   Socioeconomic History   Marital status: Married    Spouse name: Not on file   Number of children: Not on file   Years of education: Not on file   Highest education level: Not on file  Occupational History   Not on file  Tobacco Use    Smoking status: Every Day    Packs/day: 0.50    Years: 60.00    Total pack years: 30.00    Types: Cigarettes    Passive exposure: Current   Smokeless tobacco: Never  Vaping Use   Vaping Use: Never used  Substance and Sexual Activity   Alcohol use: No   Drug use: No   Sexual activity: Not on file  Other Topics Concern   Not on file  Social History Narrative   Not on file   Social Determinants of Health   Financial Resource Strain: Not on file  Food Insecurity: No Food Insecurity (09/08/2022)   Hunger Vital Sign    Worried About Running Out of Food in the Last Year: Never true    Ran Out of Food in the Last Year: Never true  Transportation Needs: No Transportation Needs (09/08/2022)   PRAPARE - Hydrologist (Medical): No    Lack of Transportation (Non-Medical): No  Physical Activity: Not on file  Stress: Not on file  Social Connections: Not on file  Intimate Partner Violence: Not At Risk (09/08/2022)   Humiliation, Afraid, Rape, and Kick questionnaire    Fear of Current or Ex-Partner: No    Emotionally Abused: No    Physically Abused: No    Sexually Abused: No    Family History:    Family History  Problem Relation Age of Onset   Colon cancer Neg Hx    Esophageal cancer Neg Hx    Rectal cancer Neg Hx    Stomach cancer Neg Hx      ROS:  Please see the history of present illness.   All other ROS reviewed and negative.     Physical Exam/Data:   Vitals:   09/09/22 0009 09/09/22 0032 09/09/22 0242 09/09/22 0537  BP: 100/62 112/60 113/65 126/69  Pulse: 94  83 89  Resp: (!) 23  (!) 23 16  Temp: 100.3 F (37.9 C)  98.3 F (36.8 C) 98.2 F (36.8 C)  TempSrc: Oral  Oral Oral  SpO2: 92%  96% 95%  Weight:      Height:        Intake/Output Summary (Last 24 hours) at 09/09/2022 0755 Last data filed at 09/09/2022 0450 Gross per 24 hour  Intake 1137.95 ml  Output 200 ml  Net 937.95 ml      09/08/2022    5:05 AM 02/10/2021   11:51  AM 01/17/2013    7:32 AM  Last 3 Weights  Weight (lbs) 158 lb 4.6 oz 158 lb 4.6 oz 181 lb  Weight (kg) 71.8 kg 71.8 kg 82.101 kg     Body mass index is 22.08 kg/m.  General:  Well nourished, well developed, in no acute distress HEENT: normal Neck: no JVD Vascular: No carotid bruits; Distal pulses 2+  bilaterally Cardiac:  normal S1, S2; RRR; no murmur  Lungs:  clear to auscultation bilaterally, no wheezing, rhonchi or rales  Abd: soft, nontender, no hepatomegaly  Ext: B LE edema 1-2+ Musculoskeletal:  No deformities, BUE and BLE strength normal and equal Skin: warm and dry  Neuro:  CNs 2-12 intact, no focal abnormalities noted Psych:  Normal affect   EKG:  The EKG was personally reviewed and demonstrates:  sinus tachycardia with HR 110 with ST depression inferior leads Telemetry:  Telemetry was personally reviewed and demonstrates:  sinus rhythm with HR 80s  Relevant CV Studies:  Echo 09/08/22: 1. Left ventricular ejection fraction, by estimation, is 40 to 45%. The  left ventricle has mildly decreased function. The left ventricle  demonstrates regional wall motion abnormalities (LAD territory vs stress  cardiomyopathy). Left ventricular diastolic   parameters are consistent with Grade I diastolic dysfunction (impaired  relaxation).   2. Right ventricular systolic function is normal. The right ventricular  size is normal. There is moderately elevated pulmonary artery systolic  pressure.   3. The mitral valve is grossly normal. No evidence of mitral valve  regurgitation. No evidence of mitral stenosis.   4. The aortic valve is calcified. There is mild calcification of the  aortic valve. There is mild thickening of the aortic valve. Aortic valve  regurgitation is not visualized. No aortic stenosis is present   Laboratory Data:  High Sensitivity Troponin:  No results for input(s): "TROPONINIHS" in the last 720 hours.   Chemistry Recent Labs  Lab 09/08/22 0650 09/09/22 0423   NA 136 134*  K 3.7 4.0  CL 99 97*  CO2 24 24  GLUCOSE 152* 158*  BUN 25* 30*  CREATININE 1.13 1.63*  CALCIUM 7.2* 6.9*  GFRNONAA >60 41*  ANIONGAP 13 13    No results for input(s): "PROT", "ALBUMIN", "AST", "ALT", "ALKPHOS", "BILITOT" in the last 168 hours. Lipids No results for input(s): "CHOL", "TRIG", "HDL", "LABVLDL", "LDLCALC", "CHOLHDL" in the last 168 hours.  Hematology Recent Labs  Lab 09/08/22 0650 09/09/22 0423  WBC 13.7* 13.3*  RBC 4.25 4.31  HGB 12.8* 13.1  HCT 37.5* 39.2  MCV 88.2 91.0  MCH 30.1 30.4  MCHC 34.1 33.4  RDW 12.5 12.4  PLT 172 157   Thyroid No results for input(s): "TSH", "FREET4" in the last 168 hours.  BNP Recent Labs  Lab 09/08/22 2101  BNP 507.3*    DDimer No results for input(s): "DDIMER" in the last 168 hours.   Radiology/Studies:  ECHOCARDIOGRAM COMPLETE  Result Date: 09/08/2022    ECHOCARDIOGRAM REPORT   Patient Name:   EURA RUEDA Date of Exam: 09/08/2022 Medical Rec #:  [id number]      Height:       71.0 in Accession #:    [id number]     Weight:       158.3 lb Date of Birth:  07-Mar-1936      BSA:          1.909 m Patient Age:    89 years       BP:           143/89 mmHg Patient Gender: M              HR:           106 bpm. Exam Location:  Inpatient Procedure: 2D Echo and Intracardiac Opacification Agent Indications:    CHF  History:        Patient has prior history  of Echocardiogram examinations, most                 recent 02/10/2021. Risk Factors:Current Smoker and Hypertension.  Sonographer:    Harvie Junior Referring Phys: [id number] AMRIT ADHIKARI  Sonographer Comments: Technically difficult study due to poor echo windows. Image acquisition challenging due to respiratory motion and positioning. IMPRESSIONS  1. Left ventricular ejection fraction, by estimation, is 40 to 45%. The left ventricle has mildly decreased function. The left ventricle demonstrates regional wall motion abnormalities (LAD territory vs stress cardiomyopathy). Left  ventricular diastolic  parameters are consistent with Grade I diastolic dysfunction (impaired relaxation).  2. Right ventricular systolic function is normal. The right ventricular size is normal. There is moderately elevated pulmonary artery systolic pressure.  3. The mitral valve is grossly normal. No evidence of mitral valve regurgitation. No evidence of mitral stenosis.  4. The aortic valve is calcified. There is mild calcification of the aortic valve. There is mild thickening of the aortic valve. Aortic valve regurgitation is not visualized. No aortic stenosis is present. Comparison(s): Prior images reviewed side by side. Wall motion chagnes new from 2022; both studies does with echo-contrast. FINDINGS  Left Ventricle: Left ventricular ejection fraction, by estimation, is 40 to 45%. The left ventricle has mildly decreased function. The left ventricle demonstrates regional wall motion abnormalities. Definity contrast agent was given IV to delineate the left ventricular endocardial borders. 3D left ventricular ejection fraction analysis performed but not reported based on interpreter judgement due to suboptimal tracking. The left ventricular internal cavity size was normal in size. There is no left ventricular hypertrophy. Left ventricular diastolic parameters are consistent with Grade I diastolic dysfunction (impaired relaxation).  LV Wall Scoring: The apex is akinetic. The apical lateral segment, apical septal segment, apical anterior segment, and apical inferior segment are hypokinetic. Right Ventricle: The right ventricular size is normal. No increase in right ventricular wall thickness. Right ventricular systolic function is normal. There is moderately elevated pulmonary artery systolic pressure. The tricuspid regurgitant velocity is 3.34 m/s, and with an assumed right atrial pressure of 3 mmHg, the estimated right ventricular systolic pressure is 0000000 mmHg. Left Atrium: Left atrial size was normal in size.  Right Atrium: Right atrial size was normal in size. Pericardium: There is no evidence of pericardial effusion. Mitral Valve: The mitral valve is grossly normal. No evidence of mitral valve regurgitation. No evidence of mitral valve stenosis. Tricuspid Valve: The tricuspid valve is not well visualized. Tricuspid valve regurgitation is not demonstrated. No evidence of tricuspid stenosis. Aortic Valve: The aortic valve is calcified. There is mild calcification of the aortic valve. There is mild thickening of the aortic valve. There is mild aortic valve annular calcification. Aortic valve regurgitation is not visualized. No aortic stenosis  is present. Aortic valve mean gradient measures 7.0 mmHg. Aortic valve peak gradient measures 12.7 mmHg. Aortic valve area, by VTI measures 1.71 cm. Pulmonic Valve: The pulmonic valve was not well visualized. Pulmonic valve regurgitation is not visualized. No evidence of pulmonic stenosis. Aorta: The aortic root and ascending aorta are structurally normal, with no evidence of dilitation. IAS/Shunts: The interatrial septum appears to be lipomatous. The interatrial septum was not well visualized.  LEFT VENTRICLE PLAX 2D LVIDd:         4.40 cm      Diastology LVIDs:         3.30 cm      LV e' medial:    6.42 cm/s LV PW:  1.00 cm      LV E/e' medial:  6.0 LV IVS:        1.00 cm      LV e' lateral:   10.00 cm/s LVOT diam:     2.20 cm      LV E/e' lateral: 3.9 LV SV:         49 LV SV Index:   25 LVOT Area:     3.80 cm                              3D Volume EF: LV Volumes (MOD)            3D EF:        62 % LV vol d, MOD A2C: 108.0 ml LV EDV:       178 ml LV vol d, MOD A4C: 127.5 ml LV ESV:       67 ml LV vol s, MOD A2C: 58.8 ml  LV SV:        111 ml LV vol s, MOD A4C: 65.6 ml LV SV MOD A2C:     49.2 ml LV SV MOD A4C:     127.5 ml LV SV MOD BP:      57.8 ml RIGHT VENTRICLE RV Basal diam:  2.80 cm RV Mid diam:    2.10 cm RV S prime:     19.20 cm/s TAPSE (M-mode): 1.9 cm LEFT ATRIUM              Index        RIGHT ATRIUM          Index LA diam:        3.30 cm 1.73 cm/m   RA Area:     9.88 cm LA Vol (A2C):   37.8 ml 19.80 ml/m  RA Volume:   20.30 ml 10.63 ml/m LA Vol (A4C):   37.6 ml 19.70 ml/m LA Biplane Vol: 39.0 ml 20.43 ml/m  AORTIC VALVE                     PULMONIC VALVE AV Area (Vmax):    1.77 cm      PV Vmax:       1.15 m/s AV Area (Vmean):   1.72 cm      PV Peak grad:  5.3 mmHg AV Area (VTI):     1.71 cm AV Vmax:           178.50 cm/s AV Vmean:          121.500 cm/s AV VTI:            0.284 m AV Peak Grad:      12.7 mmHg AV Mean Grad:      7.0 mmHg LVOT Vmax:         83.30 cm/s LVOT Vmean:        54.900 cm/s LVOT VTI:          0.128 m LVOT/AV VTI ratio: 0.45  AORTA Ao Root diam: 3.30 cm Ao Asc diam:  3.70 cm MITRAL VALVE                TRICUSPID VALVE MV Area (PHT): 5.13 cm     TR Peak grad:   44.6 mmHg MV Decel Time: 148 msec     TR Vmax:        334.00 cm/s MR Peak grad: 43.6 mmHg MR Vmax:  330.00 cm/s   SHUNTS MV E velocity: 38.80 cm/s   Systemic VTI:  0.13 m MV A velocity: 105.00 cm/s  Systemic Diam: 2.20 cm MV E/A ratio:  0.37 Rudean Haskell MD Electronically signed by Rudean Haskell MD Signature Date/Time: 09/08/2022/4:21:57 PM    Final    DG Chest Port 1 View  Result Date: 09/08/2022 CLINICAL DATA:  Pain and shortness of breath.  Hypertension. EXAM: PORTABLE CHEST 1 VIEW COMPARISON:  CT 12/05/2021 FINDINGS: Normal heart size. Aortic atherosclerotic calcifications. No pleural fluid or airspace disease. The visualized osseous structures are unremarkable. IMPRESSION: No active disease. Electronically Signed   By: Kerby Moors M.D.   On: 09/08/2022 07:22   DG Hip Unilat W or Wo Pelvis 2-3 Views Left  Result Date: 09/08/2022 CLINICAL DATA:  Status post fall. EXAM: DG HIP (WITH OR WITHOUT PELVIS) 2-3V LEFT COMPARISON:  None Available. FINDINGS: There is an acute fracture deformity involving the subcapital left femoral neck. Superior displacement of the  distal fracture fragments identified. Right hip intact. IMPRESSION: Acute subcapital left femoral neck fracture. Electronically Signed   By: Kerby Moors M.D.   On: 09/08/2022 05:47     Assessment and Plan:   Acute systolic heart failure - New cardiomyopathy  Pulmonary hypertension Coronary calcifications on CT chest 2023 Hypertension AKI Echocardiogram this admission with LVEF 40-45% with WMA in the LAD territory vs stress cardiomyopathy - new - initial EKG with ST depression in inferior leads - will repeat an EKG this morning - he does not have a history of ischemic heart disease but CRF include smoking history and hypertension - three vessel disease seen on recent CT chest, study does not allow for determination of obstructive disease - findings of new WMA on echo concerning for ischemia vs stress CM - denies chest pain, DOE, and SOB; orthopnea difficult to evaluate (chronically sleeps in recliner) - sCr 1.63 (1.13) - BNP 507 - has received 40 mg IV lasix - only 200 cc urine output charted - if this is correct, recommend bladder scan given history of urinary retention - he remains volume up, consider additional IV lasix this morning   Fall with hip fracture Sounds like a mechanical fall, denies chest pain, SOB, and palpitations prior to fall, no LOC   Preoperative risk evaluation for MACE prior to hip surgery New CM concerning for ischemia vs stress CM. Difficult situation. He reports activity equivalent to 4.0 METS without angina. No prior history of ischemic heart disease, now with new cardiomyopathy and WMA, CT evidence of coronary calcifications. According to the RCRI, he has at least a 0.9% risk of MACE. We discussed his risk at length and this may actually be higher given his heart disease that has not been evaluated. Will review imaging with attending. Will likely need evaluation of his CM, but suspect this will need to occur after his hip surgery. He is aware of his risk and  wishes to proceed. Cautious with fluids during surgery.   Risk Assessment/Risk Scores:        New York Heart Association (NYHA) Functional Class NYHA Class III        For questions or updates, please contact [place] Please consult [email or web address] for contact info under    Signed, Ledora Bottcher, [place]  09/09/2022 7:55 AM

## 2022-09-09 NOTE — Progress Notes (Signed)
Discussion with anesthesiology and cardiology ultimately deemed that patient would be safer to have surgery done at [facility] given high cardiac risk. Will plan for OR tomorrow pending OR availability and medical clearance. Patient to be NPO after midnight.

## 2022-09-09 NOTE — Anesthesia Preprocedure Evaluation (Deleted)
Anesthesia Evaluation    Reviewed: Allergy & Precautions, Patient's Chart, lab work & pertinent test results, Unable to perform ROS - Chart review only  Airway        Dental   Pulmonary Current Smoker          Cardiovascular hypertension, Pt. on medications pulmonary hypertension+ Valvular Problems/Murmurs   Echo 09/08/2022  1. Left ventricular ejection fraction, by estimation, is 40 to 45%. The left ventricle has mildly decreased function. The left ventricle demonstrates regional wall motion abnormalities (LAD territory vs stress cardiomyopathy). Left ventricular diastolic parameters are consistent with Grade I diastolic dysfunction (impaired relaxation).   2. Right ventricular systolic function is normal. The right ventricular size is normal. There is moderately elevated pulmonary artery systolic pressure.   3. The mitral valve is grossly normal. No evidence of mitral valve regurgitation. No evidence of mitral stenosis.   4. The aortic valve is calcified. There is mild calcification of the aortic valve. There is mild thickening of the aortic valve. Aortic valve regurgitation is not visualized. No aortic stenosis is present.   Comparison(s): Prior images reviewed side by side. Wall motion chagnes new  from 2022; both studies does with echo-contrast.      Neuro/Psych negative neurological ROS     GI/Hepatic Neg liver ROS,GERD  ,,  Endo/Other  negative endocrine ROS    Renal/GU Renal disease     Musculoskeletal negative musculoskeletal ROS (+)    Abdominal   Peds  Hematology negative hematology ROS (+)   Anesthesia Other Findings   Reproductive/Obstetrics                              Anesthesia Physical Anesthesia Plan  ASA: 4  Anesthesia Plan: General   Post-op Pain Management: Ofirmev IV (intra-op)*   Induction: Intravenous  PONV Risk Score and Plan: Ondansetron, Dexamethasone and  Treatment may vary due to age or medical condition  Airway Management Planned: Oral ETT  Additional Equipment: Arterial line  Intra-op Plan:   Post-operative Plan: Possible Post-op intubation/ventilation  Informed Consent:   Plan Discussed with:   Anesthesia Plan Comments: (Discussed cardiac status with Dr. Zachery Dakins and Dr. Audie Box with cardiology. Dr. Audie Box states that given his lack of q-waves and/or ST elevation, he is confident this is not an ischemic cardiomyopathy. )         Anesthesia Quick Evaluation

## 2022-09-09 NOTE — Anesthesia Postprocedure Evaluation (Signed)
Anesthesia Post Note  Patient: Perry Rosales  Procedure(s) Performed: AN AD [place]     Patient location during evaluation: Other (Short stay) Anesthesia Type: Regional Level of consciousness: awake and alert Pain management: pain level controlled Vital Signs Assessment: post-procedure vital signs reviewed and stable Respiratory status: spontaneous breathing Cardiovascular status: stable Anesthetic complications: no   No notable events documented.  Last Vitals:  Vitals:   09/09/22 1630 09/09/22 1635  BP: 133/63 131/68  Pulse: 88 89  Resp: 19 19  Temp:    SpO2: 97% 94%    Last Pain:  Vitals:   09/09/22 1640  TempSrc:   PainSc: [place]

## 2022-09-09 NOTE — Consult Note (Signed)
ORTHOPAEDIC CONSULTATION  REQUESTING PHYSICIAN: Shelly Coss, MD  Chief Complaint: Left hip fracture  HPI: Perry Rosales is a 87 y.o. male who sustained a fall yesterday helping his elderly wife.  He was seen emergency room found to have a femoral neck fracture.  He notes ongoing burning pain on the lateral side of the right hip that has been going on for some time.  He thinks this contributed to his fall.  Notes that the lateral thigh pain today is feeling much better.  She is limited by the left groin pain where he has the known hip fracture.  Also noticed to have increased swelling his lower extremities.  Cardiology has been consulted.  Past Medical History:  Diagnosis Date   GERD (gastroesophageal reflux disease)    Heart murmur    Hypertension    Past Surgical History:  Procedure Laterality Date   CATARACT EXTRACTION W/ INTRAOCULAR LENS  IMPLANT, BILATERAL     CHOLECYSTECTOMY     COLONOSCOPY     Social History   Socioeconomic History   Marital status: Married    Spouse name: Not on file   Number of children: Not on file   Years of education: Not on file   Highest education level: Not on file  Occupational History   Not on file  Tobacco Use   Smoking status: Every Day    Packs/day: 0.50    Years: 60.00    Total pack years: 30.00    Types: Cigarettes    Passive exposure: Current   Smokeless tobacco: Never  Vaping Use   Vaping Use: Never used  Substance and Sexual Activity   Alcohol use: No   Drug use: No   Sexual activity: Not on file  Other Topics Concern   Not on file  Social History Narrative   Not on file   Social Determinants of Health   Financial Resource Strain: Not on file  Food Insecurity: No Food Insecurity (09/08/2022)   Hunger Vital Sign    Worried About Running Out of Food in the Last Year: Never true    Ran Out of Food in the Last Year: Never true  Transportation Needs: No Transportation Needs (09/08/2022)   PRAPARE - Armed forces logistics/support/administrative officer (Medical): No    Lack of Transportation (Non-Medical): No  Physical Activity: Not on file  Stress: Not on file  Social Connections: Not on file   Family History  Problem Relation Age of Onset   Colon cancer Neg Hx    Esophageal cancer Neg Hx    Rectal cancer Neg Hx    Stomach cancer Neg Hx    No Known Allergies   Positive ROS: All other systems have been reviewed and were otherwise negative with the exception of those mentioned in the HPI and as above.  Physical Exam: General: Alert, no acute distress Cardiovascular: No pedal edema Respiratory: No cyanosis, no use of accessory musculature Skin: No lesions in the area of chief complaint Neurologic: Sensation intact distally Psychiatric: Patient is competent for consent with normal mood and affect  MUSCULOSKELETAL:  LLE No traumatic wounds, ecchymosis, or rash  Nontender  No knee or ankle effusion  Sens DPN, SPN, TN intact  Motor EHL, ext, flex 5/5  DP 2+,, mild distal edema  RLE No traumatic wounds, ecchymosis, or rash  Nontender  No groin pain with log roll  No knee or ankle effusion  Knee stable to varus/ valgus stress  Sens DPN, SPN,  TN intact  Motor EHL, ext, flex 5/5  DP 2+,, mild distal edema  IMAGING: Xrays pelvis and left hip reviewed demonstrates a displaced femoral neck fracture  Assessment: Principal Problem:   Closed left hip fracture (HCC) Active Problems:   Benign prostatic hyperplasia with lower urinary tract symptoms   Essential hypertension   Gastro-esophageal reflux disease without esophagitis   Leg edema  Left displaced femoral neck fracture  Plan: Discussed with patient and son at bedside today there is a femoral neck fracture indicated for arthroplasty.  Given the patient is very independent and caretaker for his wife plan for total hip arthroplasty.  Surgical plan risk, benefits were discussed with the patient and his son. Hopeful for surgery this afternoon pending  or availability and cardiac clearance.  If patient unable to have surgery today will update his diet order.  Please keep n.p.o. for now.    Willaim Sheng, MD  Contact information:   [phone] 7am-5pm epic message Dr. Zachery Dakins, or call office for patient follow up: [phone] After hours and holidays please check [email or web address] for group call information for [facility]

## 2022-09-09 NOTE — Anesthesia Procedure Notes (Addendum)
Anesthesia Regional Block: Femoral nerve block   Pre-Anesthetic Checklist: , timeout performed,  Correct Patient, Correct Site, Correct Laterality,  Correct Procedure, Correct Position, site marked,  Risks and benefits discussed,  Surgical consent,  Pre-op evaluation,  At surgeon's request and post-op pain management  Laterality: Lower and Left  Prep: chloraprep       Needles:  Injection technique: Single-shot  Needle Type: Stimulator Needle - 80     Needle Length: 9cm  Needle Gauge: 22   Needle insertion depth: 6 cm   Additional Needles:   Procedures:, nerve stimulator,,, ultrasound used (permanent image in chart),,     Nerve Stimulator or Paresthesia:  Response: Patellar snap, 0.5 mA  Additional Responses:   Narrative:  Start time: 09/09/2022 4:15 PM End time: 09/09/2022 4:38 PM Injection made incrementally with aspirations every 5 mL.  Performed by: Personally  Anesthesiologist: Nolon Nations, MD  Additional Notes: BP cuff, EKG monitors applied. Sedation begun. Femoral artery palpated for location of nerve. After nerve location verified with U/S, anesthetic injected incrementally, slowly, and after negative aspirations under direct u/s guidance. Good perineural spread. Patient tolerated well.

## 2022-09-09 NOTE — Progress Notes (Signed)
1700: Report called to [employer] on [employer]. Pt. Continues to be monitored on telemmetry, Is on 4L. O2 per nasal cannula and has foley to straight drain.He reports his pain level is 4 now after receiving 84mg Fentanyl during nerve block at 1630. Son at bedside. [facility]

## 2022-09-09 NOTE — Progress Notes (Addendum)
PROGRESS NOTE  Perry Rosales  [id number] DOB: [birth year] DOA: 09/08/2022 PCP: Donnajean Lopes, MD   Brief Narrative:  is a 87 y.o. male with medical history significant of hypertension, GERD, BPH who lives with his demented wife, ambulates without any problem at home was brought to the emergency department today after he was found on the floor.  Patient usually sleeps on the chair so that he can check his wife at night.  Last night, he got out of the chair and was going to the bathroom, he lost his balance and fell on his left side .On presentation, he was hypertensive and in mild sinus tachycardia.  He was complaining of pain on the left hip.  X-ray of the hips showed acute subcapital left femoral neck fracture.  Orthopedics consulted.  Looked volume overloaded on presentation with bilateral lower extremity edema, echo showing diminished EF.  Cardiology following, on IV diuresis.  Plan for ORIF today  Assessment & Plan:  Principal Problem:   Closed left hip fracture (HCC) Active Problems:   Benign prostatic hyperplasia with lower urinary tract symptoms   Essential hypertension   Gastro-esophageal reflux disease without esophagitis   Leg edema  Closed left hip fracture: Fell on the floor, developed left hip pain.  Ambulates normally at baseline. X-ray of the hips showed acute subcapital left femoral neck fracture. Orthopedics already consulted, plan for  ORIF tomorrow.  N.p.o. after midnight.  SCD for DVT prophylaxis for now. PT/OT evaluation after surgery.  Continue supportive care, pain management.   Acute combined systolic/diastolic CHF: Last echo done on 01/2021 showed EF of 123456, grade 1 diastolic dysfunction. Patient has bilateral lower extremity edema, ongoing for last several months.  New echo showed EF of 40 to 45%, regional wall motion abnormality grade 1 diastolic dysfunction.  Amlodipine discontinued.  Started on low-dose metoprolol.losartan was started here but has been  discontinued because of AKI. Continue Lasix 40 mg IV twice a day for now.  Monitor input/output, daily weight  AKI: Baseline creatinine normal.  Creatinine bumped up to 1.6.  BNP elevated.  He looks volume overloaded.  Also became mildly hypotensive this morning which could have contributed also.  Will put Foley catheter so that we can measure urine output.  Check BMP tomorrow  hypertension: Was taking amlodipine at home, discontinued.  Currently on metoprolol only.BP stable this mrng   History of BPH: Takes tamsulosin at home   GERD: Continue PPI  Leukocytosis: This most likely reactive, continue monitor  Addendum: After discussion with anesthesia,cardiology and orthopedics,we decided to transfer him to [facility] for  arthoplasty given his cardiac history.Cardiology has cleared for surgery.He is ok to  proceed with the planned procedure from medical perspective.       DVT prophylaxis:SCDs Start: 09/08/22 0741     Code Status: Full Code  Family Communication: Discussed with son at bedside on 3/12  Patient status:Inpatient  Patient is from :Home  Anticipated discharge to:[facility]  Estimated DC date:2-3 days   Consultants: Cardiology, orthopedics  Procedures: None  Antimicrobials:  Anti-infectives (From admission, onward)    None       Subjective: Patient seen and examined at bedside today.  He looks much better and comfortable.  Denies any significant pain on the left hip.  Waiting for surgery.  Continues to have persistent bilateral lower extremity edema.  Denies any shortness of breath today.  He has been kept on 3 L of oxygen, I have asked the nurse to wean and  monitor him on room air  Objective: Vitals:   09/09/22 0032 09/09/22 0242 09/09/22 0537 09/09/22 1037  BP: 112/60 113/65 126/69 122/86  Pulse:  83 89 86  Resp:  (!) '23 16 18  '$ Temp:  98.3 F (36.8 C) 98.2 F (36.8 C) 99.1 F (37.3 C)  TempSrc:  Oral Oral Oral  SpO2:  96% 95% 97%  Weight:       Height:        Intake/Output Summary (Last 24 hours) at 09/09/2022 1127 Last data filed at 09/09/2022 0900 Gross per 24 hour  Intake 1137.95 ml  Output 200 ml  Net 937.95 ml   Filed Weights   09/08/22 0505  Weight: 71.8 kg    Examination:   General exam: Overall comfortable, not in distress, pleasant elderly gentleman HEENT: PERRL Respiratory system: Few crackles on bilateral bases Cardiovascular system: S1 & S2 heard, RRR.  Gastrointestinal system: Abdomen is nondistended, soft and nontender. Central nervous system: Alert and oriented Extremities: Bilateral lower extremity pitting edema, no clubbing ,no cyanosis, tenderness on the left hip Skin: No rashes, no ulcers,no icterus     Data Reviewed: I have personally reviewed following labs and imaging studies  CBC: Recent Labs  Lab 09/08/22 0650 09/09/22 0423  WBC 13.7* 13.3*  NEUTROABS 12.6*  --   HGB 12.8* 13.1  HCT 37.5* 39.2  MCV 88.2 91.0  PLT 172 [id number]   Basic Metabolic Panel: Recent Labs  Lab 09/08/22 0650 09/09/22 0423  NA 136 134*  K 3.7 4.0  CL 99 97*  CO2 24 24  GLUCOSE 152* 158*  BUN 25* 30*  CREATININE 1.13 1.63*  CALCIUM 7.2* 6.9*     No results found for this or any previous visit (from the past 240 hour(s)).   Radiology Studies: ECHOCARDIOGRAM COMPLETE  Result Date: 09/08/2022    ECHOCARDIOGRAM REPORT   Patient Name:   Perry Rosales Date of Exam: 09/08/2022 Medical Rec #:  [id number]      Height:       71.0 in Accession #:    [id number]     Weight:       158.3 lb Date of Birth:  June 27, 1936      BSA:          1.909 m Patient Age:    52 years       BP:           143/89 mmHg Patient Gender: M              HR:           106 bpm. Exam Location:  Inpatient Procedure: 2D Echo and Intracardiac Opacification Agent Indications:    CHF  History:        Patient has prior history of Echocardiogram examinations, most                 recent 02/10/2021. Risk Factors:Current Smoker and Hypertension.   Sonographer:    Harvie Junior Referring Phys: [id number] Josejuan Hoaglin  Sonographer Comments: Technically difficult study due to poor echo windows. Image acquisition challenging due to respiratory motion and positioning. IMPRESSIONS  1. Left ventricular ejection fraction, by estimation, is 40 to 45%. The left ventricle has mildly decreased function. The left ventricle demonstrates regional wall motion abnormalities (LAD territory vs stress cardiomyopathy). Left ventricular diastolic  parameters are consistent with Grade I diastolic dysfunction (impaired relaxation).  2. Right ventricular systolic function is normal. The right ventricular size is normal. There is moderately  elevated pulmonary artery systolic pressure.  3. The mitral valve is grossly normal. No evidence of mitral valve regurgitation. No evidence of mitral stenosis.  4. The aortic valve is calcified. There is mild calcification of the aortic valve. There is mild thickening of the aortic valve. Aortic valve regurgitation is not visualized. No aortic stenosis is present. Comparison(s): Prior images reviewed side by side. Wall motion chagnes new from 2022; both studies does with echo-contrast. FINDINGS  Left Ventricle: Left ventricular ejection fraction, by estimation, is 40 to 45%. The left ventricle has mildly decreased function. The left ventricle demonstrates regional wall motion abnormalities. Definity contrast agent was given IV to delineate the left ventricular endocardial borders. 3D left ventricular ejection fraction analysis performed but not reported based on interpreter judgement due to suboptimal tracking. The left ventricular internal cavity size was normal in size. There is no left ventricular hypertrophy. Left ventricular diastolic parameters are consistent with Grade I diastolic dysfunction (impaired relaxation).  LV Wall Scoring: The apex is akinetic. The apical lateral segment, apical septal segment, apical anterior segment, and apical  inferior segment are hypokinetic. Right Ventricle: The right ventricular size is normal. No increase in right ventricular wall thickness. Right ventricular systolic function is normal. There is moderately elevated pulmonary artery systolic pressure. The tricuspid regurgitant velocity is 3.34 m/s, and with an assumed right atrial pressure of 3 mmHg, the estimated right ventricular systolic pressure is 0000000 mmHg. Left Atrium: Left atrial size was normal in size. Right Atrium: Right atrial size was normal in size. Pericardium: There is no evidence of pericardial effusion. Mitral Valve: The mitral valve is grossly normal. No evidence of mitral valve regurgitation. No evidence of mitral valve stenosis. Tricuspid Valve: The tricuspid valve is not well visualized. Tricuspid valve regurgitation is not demonstrated. No evidence of tricuspid stenosis. Aortic Valve: The aortic valve is calcified. There is mild calcification of the aortic valve. There is mild thickening of the aortic valve. There is mild aortic valve annular calcification. Aortic valve regurgitation is not visualized. No aortic stenosis  is present. Aortic valve mean gradient measures 7.0 mmHg. Aortic valve peak gradient measures 12.7 mmHg. Aortic valve area, by VTI measures 1.71 cm. Pulmonic Valve: The pulmonic valve was not well visualized. Pulmonic valve regurgitation is not visualized. No evidence of pulmonic stenosis. Aorta: The aortic root and ascending aorta are structurally normal, with no evidence of dilitation. IAS/Shunts: The interatrial septum appears to be lipomatous. The interatrial septum was not well visualized.  LEFT VENTRICLE PLAX 2D LVIDd:         4.40 cm      Diastology LVIDs:         3.30 cm      LV e' medial:    6.42 cm/s LV PW:         1.00 cm      LV E/e' medial:  6.0 LV IVS:        1.00 cm      LV e' lateral:   10.00 cm/s LVOT diam:     2.20 cm      LV E/e' lateral: 3.9 LV SV:         49 LV SV Index:   25 LVOT Area:     3.80 cm                               3D Volume EF: LV Volumes (MOD)  3D EF:        62 % LV vol d, MOD A2C: 108.0 ml LV EDV:       178 ml LV vol d, MOD A4C: 127.5 ml LV ESV:       67 ml LV vol s, MOD A2C: 58.8 ml  LV SV:        111 ml LV vol s, MOD A4C: 65.6 ml LV SV MOD A2C:     49.2 ml LV SV MOD A4C:     127.5 ml LV SV MOD BP:      57.8 ml RIGHT VENTRICLE RV Basal diam:  2.80 cm RV Mid diam:    2.10 cm RV S prime:     19.20 cm/s TAPSE (M-mode): 1.9 cm LEFT ATRIUM             Index        RIGHT ATRIUM          Index LA diam:        3.30 cm 1.73 cm/m   RA Area:     9.88 cm LA Vol (A2C):   37.8 ml 19.80 ml/m  RA Volume:   20.30 ml 10.63 ml/m LA Vol (A4C):   37.6 ml 19.70 ml/m LA Biplane Vol: 39.0 ml 20.43 ml/m  AORTIC VALVE                     PULMONIC VALVE AV Area (Vmax):    1.77 cm      PV Vmax:       1.15 m/s AV Area (Vmean):   1.72 cm      PV Peak grad:  5.3 mmHg AV Area (VTI):     1.71 cm AV Vmax:           178.50 cm/s AV Vmean:          121.500 cm/s AV VTI:            0.284 m AV Peak Grad:      12.7 mmHg AV Mean Grad:      7.0 mmHg LVOT Vmax:         83.30 cm/s LVOT Vmean:        54.900 cm/s LVOT VTI:          0.128 m LVOT/AV VTI ratio: 0.45  AORTA Ao Root diam: 3.30 cm Ao Asc diam:  3.70 cm MITRAL VALVE                TRICUSPID VALVE MV Area (PHT): 5.13 cm     TR Peak grad:   44.6 mmHg MV Decel Time: 148 msec     TR Vmax:        334.00 cm/s MR Peak grad: 43.6 mmHg MR Vmax:      330.00 cm/s   SHUNTS MV E velocity: 38.80 cm/s   Systemic VTI:  0.13 m MV A velocity: 105.00 cm/s  Systemic Diam: 2.20 cm MV E/A ratio:  0.37 Rudean Haskell MD Electronically signed by Rudean Haskell MD Signature Date/Time: 09/08/2022/4:21:57 PM    Final    DG Chest Port 1 View  Result Date: 09/08/2022 CLINICAL DATA:  Pain and shortness of breath.  Hypertension. EXAM: PORTABLE CHEST 1 VIEW COMPARISON:  CT 12/05/2021 FINDINGS: Normal heart size. Aortic atherosclerotic calcifications. No pleural fluid or airspace  disease. The visualized osseous structures are unremarkable. IMPRESSION: No active disease. Electronically Signed   By: Kerby Moors M.D.   On: 09/08/2022 07:22   DG  Hip Unilat W or Wo Pelvis 2-3 Views Left  Result Date: 09/08/2022 CLINICAL DATA:  Status post fall. EXAM: DG HIP (WITH OR WITHOUT PELVIS) 2-3V LEFT COMPARISON:  None Available. FINDINGS: There is an acute fracture deformity involving the subcapital left femoral neck. Superior displacement of the distal fracture fragments identified. Right hip intact. IMPRESSION: Acute subcapital left femoral neck fracture. Electronically Signed   By: Kerby Moors M.D.   On: 09/08/2022 05:47    Scheduled Meds:  amitriptyline  50 mg Oral QHS   furosemide  40 mg Intravenous Q12H   metoprolol tartrate  25 mg Oral BID   pantoprazole  40 mg Oral Daily   polyethylene glycol  17 g Oral Daily   senna-docusate  1 tablet Oral BID   Continuous Infusions:  methocarbamol (ROBAXIN) IV 500 mg (09/09/22 0548)     LOS: 1 day   Shelly Coss, MD [facility] P3/13/2024, 11:27 AM

## 2022-09-09 NOTE — Anesthesia Preprocedure Evaluation (Signed)
Anesthesia Evaluation    Reviewed: Allergy & Precautions, Patient's Chart, lab work & pertinent test results, Unable to perform ROS - Chart review only  Airway        Dental   Pulmonary Current Smoker          Cardiovascular hypertension, Pt. on medications pulmonary hypertension+ Valvular Problems/Murmurs   Echo 09/08/2022  1. Left ventricular ejection fraction, by estimation, is 40 to 45%. The left ventricle has mildly decreased function. The left ventricle demonstrates regional wall motion abnormalities (LAD territory vs stress cardiomyopathy). Left ventricular diastolic parameters are consistent with Grade I diastolic dysfunction (impaired relaxation).   2. Right ventricular systolic function is normal. The right ventricular size is normal. There is moderately elevated pulmonary artery systolic pressure.   3. The mitral valve is grossly normal. No evidence of mitral valve regurgitation. No evidence of mitral stenosis.   4. The aortic valve is calcified. There is mild calcification of the aortic valve. There is mild thickening of the aortic valve. Aortic valve regurgitation is not visualized. No aortic stenosis is present.   Comparison(s): Prior images reviewed side by side. Wall motion chagnes new  from 2022; both studies does with echo-contrast.      Neuro/Psych negative neurological ROS     GI/Hepatic Neg liver ROS,GERD  ,,  Endo/Other  negative endocrine ROS    Renal/GU Renal disease     Musculoskeletal negative musculoskeletal ROS (+)    Abdominal   Peds  Hematology negative hematology ROS (+)   Anesthesia Other Findings   Reproductive/Obstetrics                             Anesthesia Physical Anesthesia Plan  ASA: 4  Anesthesia Plan: Regional   Post-op Pain Management:    Induction:   PONV Risk Score and Plan:   Airway Management Planned:   Additional Equipment:   Intra-op  Plan:   Post-operative Plan:   Informed Consent: I have reviewed the patients History and Physical, chart, labs and discussed the procedure including the risks, benefits and alternatives for the proposed anesthesia with the patient or authorized representative who has indicated his/her understanding and acceptance.       Plan Discussed with:   Anesthesia Plan Comments: (Discussed cardiac status with Dr. Zachery Dakins and Dr. Audie Box with cardiology. Dr. Audie Box states that given his lack of q-waves and/or ST elevation, he is confident this is not an ischemic cardiomyopathy. He had some EKG changes on the preop monitor so EKG was repeated in preop. Per Dr. 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 Ultimately, the decision was made to transfer to [facility] and postpone surgery until tomorrow. Dr. Zachery Dakins requested peripheral nerve block for pain control in the interim. )        Anesthesia Quick Evaluation

## 2022-09-09 NOTE — Progress Notes (Signed)
Upper partial dentures removed and kept at bedside in pts room - placed in pink denture cup with pt label.

## 2022-09-10 ENCOUNTER — Other Ambulatory Visit: Payer: Self-pay

## 2022-09-10 ENCOUNTER — Inpatient Hospital Stay (HOSPITAL_COMMUNITY): Payer: Medicare Other | Admitting: Certified Registered"

## 2022-09-10 ENCOUNTER — Encounter (HOSPITAL_COMMUNITY)
Admission: EM | Disposition: A | Discharge status: Rehabilitation Facility | Payer: Self-pay | Source: Home / Self Care | Attending: Family Medicine

## 2022-09-10 ENCOUNTER — Inpatient Hospital Stay (HOSPITAL_COMMUNITY): Payer: Medicare Other

## 2022-09-10 ENCOUNTER — Encounter (HOSPITAL_COMMUNITY): Payer: Self-pay | Admitting: Internal Medicine

## 2022-09-10 DIAGNOSIS — N189 Chronic kidney disease, unspecified: Secondary | ICD-10-CM

## 2022-09-10 DIAGNOSIS — I10 Essential (primary) hypertension: Secondary | ICD-10-CM | POA: Diagnosis not present

## 2022-09-10 DIAGNOSIS — R600 Localized edema: Secondary | ICD-10-CM

## 2022-09-10 DIAGNOSIS — S72002D Fracture of unspecified part of neck of left femur, subsequent encounter for closed fracture with routine healing: Secondary | ICD-10-CM

## 2022-09-10 DIAGNOSIS — I420 Dilated cardiomyopathy: Secondary | ICD-10-CM

## 2022-09-10 DIAGNOSIS — N179 Acute kidney failure, unspecified: Secondary | ICD-10-CM

## 2022-09-10 DIAGNOSIS — I509 Heart failure, unspecified: Secondary | ICD-10-CM

## 2022-09-10 DIAGNOSIS — Z87891 Personal history of nicotine dependence: Secondary | ICD-10-CM

## 2022-09-10 DIAGNOSIS — I130 Hypertensive heart and chronic kidney disease with heart failure and stage 1 through stage 4 chronic kidney disease, or unspecified chronic kidney disease: Secondary | ICD-10-CM

## 2022-09-10 DIAGNOSIS — S72002A Fracture of unspecified part of neck of left femur, initial encounter for closed fracture: Secondary | ICD-10-CM

## 2022-09-10 DIAGNOSIS — N401 Enlarged prostate with lower urinary tract symptoms: Secondary | ICD-10-CM

## 2022-09-10 HISTORY — PX: TOTAL HIP ARTHROPLASTY: SHX124

## 2022-09-10 LAB — BASIC METABOLIC PANEL
Anion gap: 15 (ref 5–15)
BUN: 41 mg/dL — ABNORMAL HIGH (ref 8–23)
CO2: 26 mmol/L (ref 22–32)
Calcium: 7.4 mg/dL — ABNORMAL LOW (ref 8.9–10.3)
Chloride: 94 mmol/L — ABNORMAL LOW (ref 98–111)
Creatinine, Ser: 1.61 mg/dL — ABNORMAL HIGH (ref 0.61–1.24)
GFR, Estimated: 42 mL/min — ABNORMAL LOW (ref >60)
Glucose, Bld: 160 mg/dL — ABNORMAL HIGH (ref 70–99)
Potassium: 4.9 mmol/L (ref 3.5–5.1)
Sodium: 135 mmol/L (ref 135–145)

## 2022-09-10 LAB — CBC
HCT: 41.5 % (ref 39.0–52.0)
Hemoglobin: 13.9 g/dL (ref 13.0–17.0)
MCH: 30.1 pg (ref 26.0–34.0)
MCHC: 33.5 g/dL (ref 30.0–36.0)
MCV: 89.8 fL (ref 80.0–100.0)
Platelets: 153 10*3/uL (ref 150–400)
RBC: 4.62 MIL/uL (ref 4.22–5.81)
RDW: 12 % (ref 11.5–15.5)
WBC: 12.1 10*3/uL — ABNORMAL HIGH (ref 4.0–10.5)
nRBC: 0 % (ref 0.0–0.2)

## 2022-09-10 LAB — SURGICAL PCR SCREEN
MRSA, PCR: NEGATIVE
Staphylococcus aureus: NEGATIVE

## 2022-09-10 SURGERY — ARTHROPLASTY, HIP, TOTAL,POSTERIOR APPROACH
Anesthesia: Monitor Anesthesia Care | Site: Hip | Laterality: Left

## 2022-09-10 MED ORDER — ACETAMINOPHEN 10 MG/ML IV SOLN
INTRAVENOUS | Status: AC
  Filled 2022-09-10: qty 100

## 2022-09-10 MED ORDER — LACTATED RINGERS IV SOLN: INTRAVENOUS | Status: DC

## 2022-09-10 MED ORDER — CEFAZOLIN SODIUM-DEXTROSE 2-4 GM/100ML-% IV SOLN
2.0000 g | INTRAVENOUS | Status: AC
  Administered 2022-09-10: 2 g via INTRAVENOUS
  Filled 2022-09-10: qty 100

## 2022-09-10 MED ORDER — ORAL CARE MOUTH RINSE: 15.0000 mL | Freq: Once | OROMUCOSAL | Status: AC

## 2022-09-10 MED ORDER — ALBUMIN HUMAN 5 % IV SOLN: INTRAVENOUS | Status: DC | PRN

## 2022-09-10 MED ORDER — POVIDONE-IODINE 10 % EX SWAB
2.0000 | Freq: Once | CUTANEOUS | Status: AC
  Administered 2022-09-10: 2 via TOPICAL

## 2022-09-10 MED ORDER — CHLORHEXIDINE GLUCONATE 0.12 % MT SOLN
OROMUCOSAL | Status: AC
  Administered 2022-09-10: 15 mL via OROMUCOSAL
  Filled 2022-09-10: qty 15

## 2022-09-10 MED ORDER — ONDANSETRON HCL 4 MG/2ML IJ SOLN
INTRAMUSCULAR | Status: DC | PRN
  Administered 2022-09-10: 4 mg via INTRAVENOUS

## 2022-09-10 MED ORDER — CHLORHEXIDINE GLUCONATE 4 % EX LIQD
60.0000 mL | Freq: Once | CUTANEOUS | Status: AC
  Administered 2022-09-10: 4 via TOPICAL
  Filled 2022-09-10: qty 15

## 2022-09-10 MED ORDER — PHENYLEPHRINE HCL-NACL 20-0.9 MG/250ML-% IV SOLN
INTRAVENOUS | Status: DC | PRN
  Administered 2022-09-10: 50 ug/min via INTRAVENOUS

## 2022-09-10 MED ORDER — PROPOFOL 500 MG/50ML IV EMUL
INTRAVENOUS | Status: DC | PRN
  Administered 2022-09-10: 25 ug/kg/min via INTRAVENOUS

## 2022-09-10 MED ORDER — DEXMEDETOMIDINE HCL IN NACL 80 MCG/20ML IV SOLN
INTRAVENOUS | Status: DC | PRN
  Administered 2022-09-10 (×3): 4 ug via BUCCAL

## 2022-09-10 MED ORDER — TRANEXAMIC ACID-NACL 1000-0.7 MG/100ML-% IV SOLN
1000.0000 mg | INTRAVENOUS | Status: AC
  Administered 2022-09-10: 1000 mg via INTRAVENOUS
  Filled 2022-09-10: qty 100

## 2022-09-10 MED ORDER — ASPIRIN 81 MG PO TBEC
81.0000 mg | DELAYED_RELEASE_TABLET | Freq: Two times a day (BID) | ORAL | Status: DC
  Administered 2022-09-11 – 2022-09-12 (×3): 81 mg via ORAL
  Filled 2022-09-10 (×3): qty 1

## 2022-09-10 MED ORDER — OXYCODONE HCL 5 MG PO TABS: 5.0000 mg | ORAL_TABLET | Freq: Once | ORAL | Status: DC | PRN

## 2022-09-10 MED ORDER — 0.9 % SODIUM CHLORIDE (POUR BTL) OPTIME
TOPICAL | Status: DC | PRN
  Administered 2022-09-10: 1000 mL

## 2022-09-10 MED ORDER — SODIUM CHLORIDE 0.9 % IR SOLN
Status: DC | PRN
  Administered 2022-09-10: 1000 mL

## 2022-09-10 MED ORDER — ACETAMINOPHEN 500 MG PO TABS: 1000.0000 mg | ORAL_TABLET | Freq: Once | ORAL | Status: DC | PRN

## 2022-09-10 MED ORDER — POLYVINYL ALCOHOL 1.4 % OP SOLN
1.0000 [drp] | OPHTHALMIC | Status: DC | PRN
  Administered 2022-09-10: 1 [drp] via OPHTHALMIC
  Filled 2022-09-10: qty 15

## 2022-09-10 MED ORDER — CHLORHEXIDINE GLUCONATE 0.12 % MT SOLN: 15.0000 mL | Freq: Once | OROMUCOSAL | Status: AC

## 2022-09-10 MED ORDER — SODIUM CHLORIDE (PF) 0.9 % IJ SOLN
INTRAMUSCULAR | Status: DC | PRN
  Administered 2022-09-10: 30 mL

## 2022-09-10 MED ORDER — OXYCODONE HCL 5 MG/5ML PO SOLN: 5.0000 mg | Freq: Once | ORAL | Status: DC | PRN

## 2022-09-10 MED ORDER — FENTANYL CITRATE (PF) 100 MCG/2ML IJ SOLN
25.0000 ug | INTRAMUSCULAR | Status: DC | PRN
  Administered 2022-09-10: 25 ug via INTRAVENOUS

## 2022-09-10 MED ORDER — ACETAMINOPHEN 160 MG/5ML PO SOLN: 1000.0000 mg | Freq: Once | ORAL | Status: DC | PRN

## 2022-09-10 MED ORDER — FENTANYL CITRATE (PF) 100 MCG/2ML IJ SOLN
INTRAMUSCULAR | Status: AC
  Filled 2022-09-10: qty 2

## 2022-09-10 MED ORDER — ACETAMINOPHEN 10 MG/ML IV SOLN
1000.0000 mg | Freq: Once | INTRAVENOUS | Status: DC | PRN
  Administered 2022-09-10: 1000 mg via INTRAVENOUS

## 2022-09-10 MED ORDER — BUPIVACAINE LIPOSOME 1.3 % IJ SUSP
INTRAMUSCULAR | Status: AC
  Filled 2022-09-10: qty 20

## 2022-09-10 MED ORDER — CEFAZOLIN SODIUM-DEXTROSE 2-4 GM/100ML-% IV SOLN
2.0000 g | Freq: Three times a day (TID) | INTRAVENOUS | Status: AC
  Administered 2022-09-10 – 2022-09-11 (×2): 2 g via INTRAVENOUS
  Filled 2022-09-10 (×2): qty 100

## 2022-09-10 MED ORDER — OXYCODONE HCL 5 MG PO TABS
5.0000 mg | ORAL_TABLET | ORAL | Status: DC | PRN
  Administered 2022-09-10: 10 mg via ORAL
  Administered 2022-09-10: 5 mg via ORAL
  Administered 2022-09-11 (×4): 10 mg via ORAL
  Filled 2022-09-10: qty 1
  Filled 2022-09-10 (×5): qty 2

## 2022-09-10 MED ORDER — PROPOFOL 10 MG/ML IV BOLUS
INTRAVENOUS | Status: DC | PRN
  Administered 2022-09-10: 30 mg via INTRAVENOUS
  Administered 2022-09-10 (×2): 40 mg via INTRAVENOUS

## 2022-09-10 MED ORDER — PHENYLEPHRINE HCL (PRESSORS) 10 MG/ML IV SOLN
INTRAVENOUS | Status: DC | PRN
  Administered 2022-09-10 (×6): 160 ug via INTRAVENOUS

## 2022-09-10 SURGICAL SUPPLY — 59 items
ADH SKN CLS APL DERMABOND .7 (GAUZE/BANDAGES/DRESSINGS) ×1
APL PRP STRL LF DISP 70% ISPRP (MISCELLANEOUS) ×2
BLADE SAGITTAL 25.0X1.27X90 (BLADE) ×2 IMPLANT
BRUSH FEMORAL CANAL (MISCELLANEOUS) IMPLANT
CEMENT BONE SIMPLEX SPEEDSET (Cement) IMPLANT
CHLORAPREP W/TINT 26 (MISCELLANEOUS) ×4 IMPLANT
COVER SURGICAL LIGHT HANDLE (MISCELLANEOUS) ×2 IMPLANT
DERMABOND ADVANCED .7 DNX12 (GAUZE/BANDAGES/DRESSINGS) IMPLANT
DRAPE HIP W/POCKET STRL (MISCELLANEOUS) ×2 IMPLANT
DRAPE INCISE IOBAN 85X60 (DRAPES) ×2 IMPLANT
DRAPE POUCH INSTRU U-SHP 10X18 (DRAPES) ×2 IMPLANT
DRAPE U-SHAPE 47X51 STRL (DRAPES) ×4 IMPLANT
DRSG AQUACEL AG ADV 3.5X10 (GAUZE/BANDAGES/DRESSINGS) ×2 IMPLANT
ELECT BLADE 4.0 EZ CLEAN MEGAD (MISCELLANEOUS) ×1
ELECTRODE BLDE 4.0 EZ CLN MEGD (MISCELLANEOUS) ×2 IMPLANT
GLOVE BIOGEL PI IND STRL 8 (GLOVE) ×2 IMPLANT
GLOVE SRG 8 PF TXTR STRL LF DI (GLOVE) ×2 IMPLANT
GLOVE SURG ORTHO 8.0 STRL STRW (GLOVE) ×4 IMPLANT
GLOVE SURG UNDER POLY LF SZ8 (GLOVE) ×1
GOWN STRL REUS W/ TWL LRG LVL3 (GOWN DISPOSABLE) ×4 IMPLANT
GOWN STRL REUS W/ TWL XL LVL3 (GOWN DISPOSABLE) ×2 IMPLANT
GOWN STRL REUS W/TWL LRG LVL3 (GOWN DISPOSABLE) ×2
GOWN STRL REUS W/TWL XL LVL3 (GOWN DISPOSABLE) ×1
HANDPIECE INTERPULSE COAX TIP (DISPOSABLE) ×1
HEAD CERAMIC FEMORAL 36MM (Head) IMPLANT
HOOD PEEL AWAY T7 (MISCELLANEOUS) ×6 IMPLANT
INSERT TRIDENT POLY 36 0DEG (Insert) IMPLANT
KIT BASIN OR (CUSTOM PROCEDURE TRAY) ×2 IMPLANT
KIT TURNOVER KIT A (KITS) ×2 IMPLANT
MANIFOLD NEPTUNE II (INSTRUMENTS) ×2 IMPLANT
MARKER SKIN DUAL TIP RULER LAB (MISCELLANEOUS) ×2 IMPLANT
NDL 18GX1X1/2 (RX/OR ONLY) (NEEDLE) ×2 IMPLANT
NEEDLE 18GX1X1/2 (RX/OR ONLY) (NEEDLE) ×1 IMPLANT
NS IRRIG 1000ML POUR BTL (IV SOLUTION) ×2 IMPLANT
PACK TOTAL JOINT (CUSTOM PROCEDURE TRAY) ×2 IMPLANT
PRESSURIZER FEMORAL UNIV (MISCELLANEOUS) IMPLANT
RETRIEVER SUT HEWSON (MISCELLANEOUS) ×2 IMPLANT
SCREW HEX LP 6.5X30 (Screw) IMPLANT
SEALER BIPOLAR AQUA 6.0 (INSTRUMENTS) ×2 IMPLANT
SET HNDPC FAN SPRY TIP SCT (DISPOSABLE) ×2 IMPLANT
SHELL ACETABUL CLUSTER SZ 54 (Shell) IMPLANT
SOLUTION IRRIG SURGIPHOR (IV SOLUTION) ×2 IMPLANT
STEM FEM CEMT 49X158 SZ6 127D (Stem) IMPLANT
SUCTION FRAZIER HANDLE 10FR (MISCELLANEOUS) ×1
SUCTION TUBE FRAZIER 10FR DISP (MISCELLANEOUS) ×2 IMPLANT
SUT BONE WAX W31G (SUTURE) ×2 IMPLANT
SUT ETHIBOND 2 V 37 (SUTURE) ×2 IMPLANT
SUT MNCRL AB 3-0 PS2 18 (SUTURE) ×2 IMPLANT
SUT STRATAFIX 1PDS 45CM VIOLET (SUTURE) ×4 IMPLANT
SUT VIC AB 0 CT1 27 (SUTURE) ×1
SUT VIC AB 0 CT1 27XBRD ANBCTR (SUTURE) ×2 IMPLANT
SUT VIC AB 2-0 CT2 27 (SUTURE) ×4 IMPLANT
SYR 20ML LL LF (SYRINGE) ×2 IMPLANT
SYR 50ML LL SCALE MARK (SYRINGE) ×2 IMPLANT
TOWEL GREEN STERILE (TOWEL DISPOSABLE) ×2 IMPLANT
TOWER CARTRIDGE SMART MIX (DISPOSABLE) IMPLANT
TRAY FOLEY MTR SLVR 16FR STAT (SET/KITS/TRAYS/PACK) ×2 IMPLANT
TUBE SUCT ARGYLE STRL (TUBING) ×2 IMPLANT
WATER STERILE IRR 1000ML POUR (IV SOLUTION) ×2 IMPLANT

## 2022-09-10 NOTE — Anesthesia Preprocedure Evaluation (Addendum)
Anesthesia Evaluation  Patient identified by MRN, date of birth, ID band Patient awake    Reviewed: Allergy & Precautions, NPO status , Patient's Chart, lab work & pertinent test results  History of Anesthesia Complications Negative for: history of anesthetic complications  Airway Mallampati: III  TM Distance: >3 FB Neck ROM: Full    Dental  (+) Chipped, Missing, Poor Dentition,    Pulmonary shortness of breath, neg sleep apnea, neg COPD, neg recent URI, former smoker    + decreased breath sounds      Cardiovascular hypertension, Pt. on medications +CHF   Rhythm:Regular   1. Left ventricular ejection fraction, by estimation, is 40 to 45%. The  left ventricle has mildly decreased function. The left ventricle  demonstrates regional wall motion abnormalities (LAD territory vs stress  cardiomyopathy). Left ventricular diastolic   parameters are consistent with Grade I diastolic dysfunction (impaired  relaxation).   2. Right ventricular systolic function is normal. The right ventricular  size is normal. There is moderately elevated pulmonary artery systolic  pressure.   3. The mitral valve is grossly normal. No evidence of mitral valve  regurgitation. No evidence of mitral stenosis.   4. The aortic valve is calcified. There is mild calcification of the  aortic valve. There is mild thickening of the aortic valve. Aortic valve  regurgitation is not visualized. No aortic stenosis is present.     Neuro/Psych negative neurological ROS  negative psych ROS   GI/Hepatic Neg liver ROS,GERD  Controlled,,  Endo/Other  negative endocrine ROS    Renal/GU CRF and ARFRenal diseaseLab Results      Component                Value               Date                      CREATININE               1.61 (H)            09/10/2022                 Musculoskeletal  left femoral neck fracture   Abdominal   Peds  Hematology negative hematology  ROS (+) Lab Results      Component                Value               Date                      WBC                      12.1 (H)            09/10/2022                HGB                      13.9                09/10/2022                HCT                      41.5                09/10/2022  MCV                      89.8                09/10/2022                PLT                      153                 09/10/2022             Denies blood thinners   Anesthesia Other Findings   Reproductive/Obstetrics                             Anesthesia Physical Anesthesia Plan  ASA: 4  Anesthesia Plan: MAC and Spinal   Post-op Pain Management:    Induction: Intravenous  PONV Risk Score and Plan: 1 and Propofol infusion and Treatment may vary due to age or medical condition  Airway Management Planned: Nasal Cannula and Natural Airway  Additional Equipment: ClearSight  Intra-op Plan:   Post-operative Plan:   Informed Consent: I have reviewed the patients History and Physical, chart, labs and discussed the procedure including the risks, benefits and alternatives for the proposed anesthesia with the patient or authorized representative who has indicated his/her understanding and acceptance.     Dental advisory given  Plan Discussed with: CRNA  Anesthesia Plan Comments:        Anesthesia Quick Evaluation

## 2022-09-10 NOTE — Anesthesia Procedure Notes (Signed)
Spinal  Patient location during procedure: OR Start time: 09/10/2022 3:51 PM End time: 09/10/2022 3:55 PM Reason for block: surgical anesthesia Staffing Performed: anesthesiologist  Anesthesiologist: Oleta Mouse, MD Performed by: Oleta Mouse, MD Authorized by: Oleta Mouse, MD   Preanesthetic Checklist Completed: patient identified, IV checked, risks and benefits discussed, surgical consent, monitors and equipment checked, pre-op evaluation and timeout performed Spinal Block Patient position: left lateral decubitus Prep: DuraPrep Patient monitoring: heart rate, cardiac monitor, continuous pulse ox and blood pressure Approach: midline Location: L3-4 Injection technique: single-shot Needle Needle type: Pencan  Needle gauge: 24 G Needle length: 9 cm Assessment Sensory level: T6 Events: CSF return

## 2022-09-10 NOTE — TOC Initial Note (Addendum)
Transition of Care [facility]) - Initial/Assessment Note    Patient Details  Name: Perry Rosales MRN: [id number] Date of Birth: [birth year]  Transition of Care [facility]) CM/SW Contact:    Sharin Mons, RN Phone Number: 09/10/2022, 11:31 AM  Clinical Narrative:                 Presents after a fall. Suffered L hip fx. Pt is from home with wife and son Perry Rosales. Pt states will not be interested in SNF/REHAB @ D/C if needed . Agreeable to home health services. States  [facility] are already providing home health services for wife and would like to use them for himself  @ d/c if needed. Referral made with [facility], acceptance pending. Order / F2F will be needed from  MD. Pt states they have also contacted [facility] to assist with PCS once d/c.  Pt without DME. Prior to hospitalization independent with ADL's . Pt with 2 steps to enter home through garage.  Plan: Left displaced femoral neck fracture, THA today   TOC team following and will assist with needs....  Expected Discharge Plan: [place] Barriers to Discharge: Continued Medical Work up   Patient Goals and CMS Choice     Choice offered to / list presented to : Patient      Expected Discharge Plan and Services   Discharge Planning Services: CM Consult                                 [facility] Agency: [place] Date [place]: 09/10/22 Time [place]: [id number] Representative spoke with at [place]: [place] Arrangements/Services     Patient language and need for interpreter reviewed:: Yes Do you feel safe going back to the place where you live?: Yes      Need for Family Participation in Patient Care: Yes (Comment) Care giver support system in place?: Yes (comment)   Criminal Activity/Legal Involvement Pertinent to Current Situation/Hospitalization: No - Comment as needed  Activities of Daily Living Home Assistive Devices/Equipment: Walker (specify  type), Dentures (specify type), Eyeglasses ADL Screening (condition at time of admission) Patient's cognitive ability adequate to safely complete daily activities?: Yes Is the patient deaf or have difficulty hearing?: No Does the patient have difficulty seeing, even when wearing glasses/contacts?: No Does the patient have difficulty concentrating, remembering, or making decisions?: No Patient able to express need for assistance with ADLs?: Yes Does the patient have difficulty dressing or bathing?: Yes Independently performs ADLs?: No Communication: Independent Dressing (OT): Needs assistance Is this a change from baseline?: Pre-admission baseline Grooming: Needs assistance Is this a change from baseline?: Pre-admission baseline Feeding: Independent Bathing: Needs assistance Is this a change from baseline?: Pre-admission baseline Toileting: Needs assistance Is this a change from baseline?: Pre-admission baseline In/Out Bed: Needs assistance Is this a change from baseline?: Pre-admission baseline Walks in Home: Needs assistance Is this a change from baseline?: Pre-admission baseline Does the patient have difficulty walking or climbing stairs?: Yes Weakness of Legs: Both Weakness of Arms/Hands: None  Permission Sought/Granted   Permission granted to share information with : Yes, Verbal Permission Granted  Share Information with NAME: Perry Rosales  [id number]           Emotional Assessment   Attitude/Demeanor/Rapport: Engaged Affect (typically observed): Accepting Orientation: : Oriented to  Time, Oriented to Situation, Oriented  to Place, Oriented to Self Alcohol / Substance Use: Not Applicable Psych Involvement: No (comment)  Admission diagnosis:  Closed left hip fracture (HCC) [S72.002A] Closed fracture of left hip, initial encounter [facility]) [S72.002A] Patient Active Problem List   Diagnosis Date Noted   Closed left hip fracture ([place]) 09/08/2022   Leg edema  09/08/2022   Benign prostatic hyperplasia with lower urinary tract symptoms 07/17/2021   Bladder outlet obstruction 07/17/2021   Acute urinary retention    UTI (urinary tract infection) 02/09/2021   AKI (acute kidney injury) ([place]) 02/09/2021   Encounter for general adult medical examination without abnormal findings 09/18/2019   Essential hypertension 06/15/2019   Gastro-esophageal reflux disease without esophagitis 06/15/2019   Postherpetic neuralgia 06/15/2019   Tobacco user 06/15/2019   PCP:  Donnajean Lopes, MD Pharmacy:   [id number] [place] [id number]: [phone]: [address]Phone: [phone]: [phone]    Social Determinants of Health (SDOH) Social History: SDOH Screenings   Food Insecurity: No Food Insecurity (09/08/2022)  Housing: Low Risk  (09/08/2022)  Transportation Needs: No Transportation Needs (09/08/2022)  Utilities: Not At Risk (09/08/2022)  Tobacco Use: High Risk (09/09/2022)   SDOH Interventions:     Readmission Risk Interventions     No data to display

## 2022-09-10 NOTE — Discharge Instructions (Addendum)

## 2022-09-10 NOTE — Transfer of Care (Signed)
Immediate Anesthesia Transfer of Care Note  Patient: Perry Rosales  Procedure(s) Performed: TOTAL HIP REPLACEMENT (Left: Hip)  Patient Location: PACU  Anesthesia Type:MAC and Spinal  Level of Consciousness: sedated  Airway & Oxygen Therapy: Patient Spontanous Breathing and Patient connected to face mask oxygen  Post-op Assessment: Report given to RN and Post -op Vital signs reviewed and stable  Post vital signs: Reviewed and stable  Last Vitals:  Vitals Value Taken Time  BP 93/58 09/10/22 1824  Temp    Pulse 76 09/10/22 1830  Resp 21 09/10/22 1830  SpO2 93 % 09/10/22 1830  Vitals shown include unvalidated device data.  Last Pain:  Vitals:   09/10/22 1452  TempSrc:   PainSc: 0-No pain      Patients Stated Pain Goal: 0 ([id number])  Complications: No notable events documented.

## 2022-09-10 NOTE — Progress Notes (Signed)
     Subjective: Patient reports pain as mild this morning. Feeling better with nerve block. Son at bedside. Denies SOB. Hopeful for surgery today at [facility].  Objective:   VITALS:   Vitals:   09/09/22 1635 09/09/22 1824 09/09/22 2015 09/10/22 0440  BP: 131/68 126/67 135/66 129/86  Pulse: 89 89 92 89  Resp: 19  17 14   Temp:  97.7 F (36.5 C) 98.1 F (36.7 C) 98.3 F (36.8 C)  TempSrc:  Oral    SpO2: 94% 94% (!) 89% 96%  Weight:  81.5 kg    Height:  5\' 9"  (1.753 m)      Sensation intact distally Dorsiflexion/Plantar flexion intact No cellulitis present Compartment soft Distal edema mildly improved from yesterday.  Lab Results  Component Value Date   WBC 12.1 (H) 09/10/2022   HGB 13.9 09/10/2022   HCT 41.5 09/10/2022   MCV 89.8 09/10/2022   PLT 153 09/10/2022   BMET    Component Value Date/Time   NA 135 09/10/2022 0326   K 4.9 09/10/2022 0326   CL 94 (L) 09/10/2022 0326   CO2 26 09/10/2022 0326   GLUCOSE 160 (H) 09/10/2022 0326   BUN 41 (H) 09/10/2022 0326   CREATININE 1.61 (H) 09/10/2022 0326   CALCIUM 7.4 (L) 09/10/2022 0326   GFRNONAA 42 (L) 09/10/2022 0326     Assessment/Plan: 1 Day Post-Op   Principal Problem:   Closed left hip fracture (HCC) Active Problems:   Benign prostatic hyperplasia with lower urinary tract symptoms   Essential hypertension   Gastro-esophageal reflux disease without esophagitis   Leg edema  Left displaced femoral neck fracture plan for THA today.  Given patient is a high risk cardiac patient ultimately elected to transfer patient to [facility]. Fascia Iliaca block performed yesterday has improved patient's pain.  Plan for OR this afternoon pending OR availability and med/cards clearance.   Carter Kaman A Clemons Salvucci 09/10/2022, 7:46 AM   Charlies Constable, MD  Contact information:   [phone] 7am-5pm epic message Dr. Zachery Dakins, or call office for patient follow up: [phone] After hours and holidays please check  [email or web address] for group call information for [facility]

## 2022-09-10 NOTE — Progress Notes (Signed)
Progress Note  Patient Name: Perry Rosales Date of Encounter: 09/10/2022  Primary Cardiologist: Evalina Field, MD   Subjective   Patient seen and examined at his bedside.  Inpatient Medications    Scheduled Meds:  amitriptyline  50 mg Oral QHS   chlorhexidine  60 mL Topical Once   Chlorhexidine Gluconate Cloth  6 each Topical Daily   furosemide  40 mg Intravenous BID   metoprolol succinate  25 mg Oral Daily   pantoprazole  40 mg Oral Daily   polyethylene glycol  17 g Oral Daily   povidone-iodine  2 Application Topical Once   senna-docusate  1 tablet Oral BID   Continuous Infusions:   ceFAZolin (ANCEF) IV     methocarbamol (ROBAXIN) IV 500 mg (09/09/22 2116)   tranexamic acid     PRN Meds: hydrALAZINE, HYDROmorphone (DILAUDID) injection, lip balm, methocarbamol (ROBAXIN) IV, mouth rinse, oxyCODONE, phenol   Vital Signs    Vitals:   09/09/22 1824 09/09/22 2015 09/10/22 0440 09/10/22 0808  BP: 126/67 135/66 129/86 137/68  Pulse: 89 92 89 92  Resp:  '17 14 17  '$ Temp: 97.7 F (36.5 C) 98.1 F (36.7 C) 98.3 F (36.8 C) 98.1 F (36.7 C)  TempSrc: Oral   Oral  SpO2: 94% (!) 89% 96% 99%  Weight: 81.5 kg     Height: '5\' 9"'$  (1.753 m)       Intake/Output Summary (Last 24 hours) at 09/10/2022 1007 Last data filed at 09/10/2022 0523 Gross per 24 hour  Intake 0 ml  Output 1800 ml  Net -1800 ml   Filed Weights   09/08/22 0505 09/09/22 1824  Weight: 71.8 kg 81.5 kg    Telemetry    Sinus rhythm - Personally Reviewed  ECG    Sinus tachycardia- Personally Reviewed  Physical Exam    General: Comfortable Head: Atraumatic, normal size  Eyes: PEERLA, EOMI  Neck: Supple, normal JVD Cardiac: Normal S1, S2; RRR; no murmurs, rubs, or gallops Lungs: Clear to auscultation bilaterally Abd: Soft, nontender, no hepatomegaly  Ext: warm, no edema Musculoskeletal: No deformities, BUE and BLE strength normal and equal Skin: Warm and dry, no rashes   Neuro: Alert and  oriented to person, place, time, and situation, CNII-XII grossly intact, no focal deficits  Psych: Normal mood and affect   Labs    Chemistry Recent Labs  Lab 09/08/22 0650 09/09/22 0423 09/10/22 0326  NA 136 134* 135  K 3.7 4.0 4.9  CL 99 97* 94*  CO2 '24 24 26  '$ GLUCOSE 152* 158* 160*  BUN 25* 30* 41*  CREATININE 1.13 1.63* 1.61*  CALCIUM 7.2* 6.9* 7.4*  GFRNONAA >60 41* 42*  ANIONGAP '13 13 15     '$ Hematology Recent Labs  Lab 09/08/22 0650 09/09/22 0423 09/10/22 0326  WBC 13.7* 13.3* 12.1*  RBC 4.25 4.31 4.62  HGB 12.8* 13.1 13.9  HCT 37.5* 39.2 41.5  MCV 88.2 91.0 89.8  MCH 30.1 30.4 30.1  MCHC 34.1 33.4 33.5  RDW 12.5 12.4 12.0  PLT 172 157 153    Cardiac EnzymesNo results for input(s): "TROPONINI" in the last 168 hours. No results for input(s): "TROPIPOC" in the last 168 hours.   BNP Recent Labs  Lab 09/08/22 2101  BNP 507.3*     DDimer No results for input(s): "DDIMER" in the last 168 hours.   Radiology    DG Hand 2 View Right  Result Date: 09/09/2022 CLINICAL DATA:  Swelling EXAM: RIGHT HAND - 2  VIEW COMPARISON:  None Available. FINDINGS: There is an IV in the dorsum of the hand. Soft tissues are otherwise within normal limits. There is no acute fracture or dislocation identified. There is likely a healed fracture of the distal fifth tuft. IMPRESSION: 1. No acute fracture or dislocation. 2. Likely healed fracture of the distal fifth tuft. Electronically Signed   By: Ronney Asters M.D.   On: 09/09/2022 20:19   ECHOCARDIOGRAM COMPLETE  Result Date: 09/08/2022    ECHOCARDIOGRAM REPORT   Patient Name:   Perry Rosales Date of Exam: 09/08/2022 Medical Rec #:  [id number]      Height:       71.0 in Accession #:    [id number]     Weight:       158.3 lb Date of Birth:  03-12-1936      BSA:          1.909 m Patient Age:    87 years       BP:           143/89 mmHg Patient Gender: M              HR:           106 bpm. Exam Location:  Inpatient Procedure: 2D Echo and  Intracardiac Opacification Agent Indications:    CHF  History:        Patient has prior history of Echocardiogram examinations, most                 recent 02/10/2021. Risk Factors:Current Smoker and Hypertension.  Sonographer:    Harvie Junior Referring Phys: [id number] AMRIT ADHIKARI  Sonographer Comments: Technically difficult study due to poor echo windows. Image acquisition challenging due to respiratory motion and positioning. IMPRESSIONS  1. Left ventricular ejection fraction, by estimation, is 40 to 45%. The left ventricle has mildly decreased function. The left ventricle demonstrates regional wall motion abnormalities (LAD territory vs stress cardiomyopathy). Left ventricular diastolic  parameters are consistent with Grade I diastolic dysfunction (impaired relaxation).  2. Right ventricular systolic function is normal. The right ventricular size is normal. There is moderately elevated pulmonary artery systolic pressure.  3. The mitral valve is grossly normal. No evidence of mitral valve regurgitation. No evidence of mitral stenosis.  4. The aortic valve is calcified. There is mild calcification of the aortic valve. There is mild thickening of the aortic valve. Aortic valve regurgitation is not visualized. No aortic stenosis is present. Comparison(s): Prior images reviewed side by side. Wall motion chagnes new from 2022; both studies does with echo-contrast. FINDINGS  Left Ventricle: Left ventricular ejection fraction, by estimation, is 40 to 45%. The left ventricle has mildly decreased function. The left ventricle demonstrates regional wall motion abnormalities. Definity contrast agent was given IV to delineate the left ventricular endocardial borders. 3D left ventricular ejection fraction analysis performed but not reported based on interpreter judgement due to suboptimal tracking. The left ventricular internal cavity size was normal in size. There is no left ventricular hypertrophy. Left ventricular diastolic  parameters are consistent with Grade I diastolic dysfunction (impaired relaxation).  LV Wall Scoring: The apex is akinetic. The apical lateral segment, apical septal segment, apical anterior segment, and apical inferior segment are hypokinetic. Right Ventricle: The right ventricular size is normal. No increase in right ventricular wall thickness. Right ventricular systolic function is normal. There is moderately elevated pulmonary artery systolic pressure. The tricuspid regurgitant velocity is 3.34 m/s, and with an assumed right atrial pressure of  3 mmHg, the estimated right ventricular systolic pressure is 0000000 mmHg. Left Atrium: Left atrial size was normal in size. Right Atrium: Right atrial size was normal in size. Pericardium: There is no evidence of pericardial effusion. Mitral Valve: The mitral valve is grossly normal. No evidence of mitral valve regurgitation. No evidence of mitral valve stenosis. Tricuspid Valve: The tricuspid valve is not well visualized. Tricuspid valve regurgitation is not demonstrated. No evidence of tricuspid stenosis. Aortic Valve: The aortic valve is calcified. There is mild calcification of the aortic valve. There is mild thickening of the aortic valve. There is mild aortic valve annular calcification. Aortic valve regurgitation is not visualized. No aortic stenosis  is present. Aortic valve mean gradient measures 7.0 mmHg. Aortic valve peak gradient measures 12.7 mmHg. Aortic valve area, by VTI measures 1.71 cm. Pulmonic Valve: The pulmonic valve was not well visualized. Pulmonic valve regurgitation is not visualized. No evidence of pulmonic stenosis. Aorta: The aortic root and ascending aorta are structurally normal, with no evidence of dilitation. IAS/Shunts: The interatrial septum appears to be lipomatous. The interatrial septum was not well visualized.  LEFT VENTRICLE PLAX 2D LVIDd:         4.40 cm      Diastology LVIDs:         3.30 cm      LV e' medial:    6.42 cm/s LV PW:          1.00 cm      LV E/e' medial:  6.0 LV IVS:        1.00 cm      LV e' lateral:   10.00 cm/s LVOT diam:     2.20 cm      LV E/e' lateral: 3.9 LV SV:         49 LV SV Index:   25 LVOT Area:     3.80 cm                              3D Volume EF: LV Volumes (MOD)            3D EF:        62 % LV vol d, MOD A2C: 108.0 ml LV EDV:       178 ml LV vol d, MOD A4C: 127.5 ml LV ESV:       67 ml LV vol s, MOD A2C: 58.8 ml  LV SV:        111 ml LV vol s, MOD A4C: 65.6 ml LV SV MOD A2C:     49.2 ml LV SV MOD A4C:     127.5 ml LV SV MOD BP:      57.8 ml RIGHT VENTRICLE RV Basal diam:  2.80 cm RV Mid diam:    2.10 cm RV S prime:     19.20 cm/s TAPSE (M-mode): 1.9 cm LEFT ATRIUM             Index        RIGHT ATRIUM          Index LA diam:        3.30 cm 1.73 cm/m   RA Area:     9.88 cm LA Vol (A2C):   37.8 ml 19.80 ml/m  RA Volume:   20.30 ml 10.63 ml/m LA Vol (A4C):   37.6 ml 19.70 ml/m LA Biplane Vol: 39.0 ml 20.43 ml/m  AORTIC VALVE  PULMONIC VALVE AV Area (Vmax):    1.77 cm      PV Vmax:       1.15 m/s AV Area (Vmean):   1.72 cm      PV Peak grad:  5.3 mmHg AV Area (VTI):     1.71 cm AV Vmax:           178.50 cm/s AV Vmean:          121.500 cm/s AV VTI:            0.284 m AV Peak Grad:      12.7 mmHg AV Mean Grad:      7.0 mmHg LVOT Vmax:         83.30 cm/s LVOT Vmean:        54.900 cm/s LVOT VTI:          0.128 m LVOT/AV VTI ratio: 0.45  AORTA Ao Root diam: 3.30 cm Ao Asc diam:  3.70 cm MITRAL VALVE                TRICUSPID VALVE MV Area (PHT): 5.13 cm     TR Peak grad:   44.6 mmHg MV Decel Time: 148 msec     TR Vmax:        334.00 cm/s MR Peak grad: 43.6 mmHg MR Vmax:      330.00 cm/s   SHUNTS MV E velocity: 38.80 cm/s   Systemic VTI:  0.13 m MV A velocity: 105.00 cm/s  Systemic Diam: 2.20 cm MV E/A ratio:  0.37 Rudean Haskell MD Electronically signed by Rudean Haskell MD Signature Date/Time: 09/08/2022/4:21:57 PM    Final     Cardiac Studies   Echo 09/08/22: 1. Left ventricular  ejection fraction, by estimation, is 40 to 45%. The  left ventricle has mildly decreased function. The left ventricle  demonstrates regional wall motion abnormalities (LAD territory vs stress  cardiomyopathy). Left ventricular diastolic   parameters are consistent with Grade I diastolic dysfunction (impaired  relaxation).   2. Right ventricular systolic function is normal. The right ventricular  size is normal. There is moderately elevated pulmonary artery systolic  pressure.   3. The mitral valve is grossly normal. No evidence of mitral valve  regurgitation. No evidence of mitral stenosis.   4. The aortic valve is calcified. There is mild calcification of the  aortic valve. There is mild thickening of the aortic valve. Aortic valve  regurgitation is not visualized. No aortic stenosis is present    Patient Profile     87 y.o. male history of hypertension, tobacco use, BPH and new onset cardiomyopathy.  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  His clinical picture is suspicious for stress-induced cardiomyopathy.  Currently he does not appear to have any clinical symptoms-for now medical management and he has been started on beta-blockers appropriately.  Postoperatively once hemodynamically stable we will continue with optimizing his guideline directed medical therapy.  With that said the patient can proceed with his much-needed surgery, and in the setting of his new diagnosis he is at increased risk for major adverse cardiac events during procedure\.  Will closely follow along with you. Would cautious use of IV fluid during the procedure Please continue with his diuresis.    For questions  or updates, please contact [place] Please consult [email or web address] for contact info under Cardiology/STEMI.      Signed, Talma Aguillard, DO  09/10/2022, 10:07 AM

## 2022-09-10 NOTE — Op Note (Signed)
09/08/2022 - 09/10/2022  5:55 PM  PATIENT:  Perry Rosales   MRN: [id number]  PRE-OPERATIVE DIAGNOSIS: Left displaced femoral neck fracture  POST-OPERATIVE DIAGNOSIS:  same  PROCEDURE:  Procedure(s): Cemented left total hip arthroplasty posterior approach  PREOPERATIVE INDICATIONS:    Perry Rosales is an 87 y.o. male who has a diagnosis of Left displaced femoral neck fracture and elected for surgical management after failing conservative treatment.  The risks benefits and alternatives were discussed with the patient including but not limited to the risks of nonoperative treatment, versus surgical intervention including infection, bleeding, nerve injury, periprosthetic fracture, the need for revision surgery, dislocation, leg length discrepancy, blood clots, cardiopulmonary complications, morbidity, mortality, among others, and they were willing to proceed.     OPERATIVE REPORT     SURGEON:  Charlies Constable, MD    ASSISTANT: Dorise Bullion, PA-C, (Present throughout the entire procedure,  necessary for completion of procedure in a timely manner, assisting with retraction, instrumentation, and closure)     ANESTHESIA: Spinal  ESTIMATED BLOOD LOSS: [id number]    COMPLICATIONS:  None.      COMPONENTS:   54 mm Trident 2 acetabular shell, 6.5 hex screws x 2, Trident X.3 neutral liner, Accolade C cemented 127 degree neck angle size #6, 36+0 ceramic head Implant Name Type Inv. Item Serial No. Manufacturer Lot No. LRB No. Used Action  CEMENT BONE SIMPLEX SPEEDSET - [id number] Cement CEMENT BONE SIMPLEX SPEEDSET  [employer] [id number] Left 2 Implanted  SHELL ACETABUL CLUSTER SZ 54 - [id number] Shell SHELL ACETABUL CLUSTER SZ 54  [employer] [id number] Left 1 Implanted  SCREW HEX LP 6.5X30 - [id number] Screw SCREW HEX LP 6.5X30  [employer] [id number] Left 1 Implanted  SCREW HEX LP 6.5X30 - [id number] Screw SCREW HEX LP 6.5X30  [employer] [id number] Left 1 Implanted   INSERT TRIDENT POLY 36 0DEG - [id number] Insert INSERT TRIDENT POLY 36 0DEG  [employer] [id number] Left 1 Implanted  STEM FEM CEMT [id number] SZ6 127D - [id number] Stem STEM FEM CEMT [id number] SZ6 127D  [employer] [id number] Left 1 Implanted  HEAD CERAMIC FEMORAL 36MM - [id number] Head HEAD CERAMIC FEMORAL 36MM  [employer] [id number] Left 1 Implanted      PROCEDURE IN DETAIL:   The patient was met in the holding area and  identified.  The appropriate hip was identified and marked at the operative site.  The patient was then transported to the OR  and  placed under anesthesia.  At that point, the patient was  placed in the lateral decubitus position with the operative side up and  secured to the operating room table  and all bony prominences padded. A subaxillary role was also placed.    The operative lower extremity was prepped from the iliac crest to the distal leg.  Sterile draping was performed.  Preoperative antibiotics, 2 gm of ancef,1 gm of Tranexamic Acid, and 8 mg of Decadron administered. Time out was performed prior to incision.      A routine posterolateral approach was utilized via sharp dissection  carried down to the subcutaneous tissue.  Gross bleeders were Bovie coagulated.  The iliotibial band was identified and incised along the length of the skin incision through the glute max fascia.  Charnley retractor was placed with care to protect the sciatic nerve posteriorly.  With the hip internally rotated, the piriformis tendon was identified and released from the femoral insertion and tagged with a #2 Ethibond.  A capsulotomy was then performed  off the femoral insertion and also tagged with a #5 Ethibond.    The femoral neck was exposed, and I resected the femoral neck based on preoperative templating relative to the lesser trochanter.    I then exposed the deep acetabulum, cleared out any tissue including the ligamentum teres.  After adequate visualization, I excised the  labrum.  I then started reaming with a 50 mm reamer, first medializing to the floor of the cotyloid fossa, and then in the position of the cup aiming towards the greater sciatic notch, matching the version of the transverse acetabular ligament and tucked under the anterior wall. I reamed up to 54 mm reamer with good bony bed preparation and a 54 mm cup was chosen.  The real cup was then impacted into place.  Appropriate version and inclination was confirmed clinically matching their bony anatomy, and also with the use of the jig.  I placed 2 screws in the posterior superior quadrant to augment fixation.  A neutral liner was placed and impacted. It was confirmed to be appropriately seated and the acetabular retractors were removed.    I then prepared the proximal femur using the box cutter, Charnley awl, and then sequentially broached starting with 0 up to a size 6.  A trial broach, neck, and head was utilized, and I reduced the hip and it was found to have excellent stability.  There was no impingement with full extension and 90 degrees external rotation.  The hip was stable at the position of sleep and with 90 degrees flexion and 80 degrees of internal rotation.  Leg lengths were also clinically assessed in the lateral position and felt to be equal.  We then prepared canal for cementation.  The cement restrictor was measured and inserted distally.  The canal was then irrigated with the pulse lavage and 3 L of normal saline.  2 bags of Simplex cement were prepared.  Using the cement gun the cement was inserted distally and the canal was filled.  We then pressurized the canal. The real implant was then inserted matching the patient's native anteversion of approximately 20 degrees.  We then waited for 14 minutes for the cement to be fully set.  Excess cement was removed.  A lap was placed in the acetabulum prior to cementing was also removed and the acetabulum was assessed to make sure there was no cement or  bone fragments.  I again trialed and selected a 36+ 0 mm ball. The hip was then reduced and taken through a range of motion. There was no impingement with full extension and 90 degrees external rotation.  The hip was stable at the position of sleep and with 90 degrees flexion and 80 degrees of internal rotation. Leg lengths were  again assessed and felt to be restored.  We then opened, and I impacted the real head ball into place.  The posterior capsule was then closed with #2 Ethibond.  The piriformis was repaired through the base of the abductor tendon using a [place] suture passer.  I then irrigated the hip copiously with dilute Betadine and with normal saline pulse lavage. Periarticular injection was then performed with Exparel.   We repaired the fascia #1 barbed suture, followed by 0 barbed suture for the subcutaneous fat.  Skin was closed with 2-0 Vicryl and 3-0 Monocryl.  Dermabond and Aquacel dressing were applied. The patient was then awakened and returned to PACU in stable and satisfactory condition.  Leg lengths in the supine position were  assessed and felt to be clinically equal. There were no complications.  Post op recs: WB: WBAT LLE, No formal hip precautions Abx: ancef Imaging: PACU pelvis Xray Dressing: Aquacell, keep intact until follow up DVT prophylaxis: Aspirin 81BID starting POD1 Follow up: 2 weeks after surgery for a wound check with Dr. Zachery Dakins at [facility].  Address: [address], [place],  [address]  Office Phone: [phone]   Charlies Constable, MD Orthopedic Surgeon

## 2022-09-10 NOTE — Anesthesia Procedure Notes (Signed)
Arterial Line Insertion Start/End3/14/2024 4:08 PM, 09/10/2022 4:15 PM Performed by: Oleta Mouse, MD  Patient location: OR. Preanesthetic checklist: patient identified, IV checked, risks and benefits discussed, surgical consent, monitors and equipment checked, pre-op evaluation, timeout performed and anesthesia consent Lidocaine 1% used for infiltration Right, radial was placed Catheter size: 20 G Hand hygiene performed  and maximum sterile barriers used   Attempts: 1 Procedure performed without using ultrasound guided technique. Following insertion, dressing applied and Biopatch. Post procedure assessment: normal and unchanged  Patient tolerated the procedure well with no immediate complications.

## 2022-09-10 NOTE — Progress Notes (Signed)
[facility]  PROGRESS NOTE  SHAUNT GUNTRUM [id number] DOB: 11-21-1935 DOA: 09/08/2022 PCP: Donnajean Lopes, MD   Brief HPI:   87 year old male with history of hypertension, GERD, BPH who lives with his demented wife, was brought to the ED after he was found on the floor.  Patient usually sleeps in the chair, he was going to bathroom at nighttime and lost balance and fell on his left side.  On presentation in the ED he was hypotensive with mild sinus tachycardia.  He was complaining of pain in the left hip.  X-ray of the hip showed acute subcapital left femoral neck fracture.  Orthopedic surgery was consulted.    Subjective   Patient seen and examined, pain well-controlled.  Plan for surgery today.   Assessment/Plan:    Closed left hip fracture -S/p fall, x-ray of the hip showed acute subcapital left femoral neck fracture -Orthopedic surgery consulted -Plan for ORIF today  Acute combined systolic and diastolic CHF -Echocardiogram showed EF of 40 to 45% with regional wall motion abnormality, grade 1 diastolic dysfunction, new onset cardiomyopathy with apical ballooning and basal hypokinesis -Started on low-dose metoprolol -Lasix 40 mg IV twice daily -Cardiology consulted  Acute kidney injury -Creatinine is 1.61 today, was 1.60 yesterday -Started on Lasix 40 mg IV twice daily -Follow-up BMP in a.m.  Hypertension -Amlodipine discontinued -Blood pressure well-controlled, continue metoprolol  History of BPH -Continue tamsulosin  GERD -Continue PPI  Medications     amitriptyline  50 mg Oral QHS   chlorhexidine  60 mL Topical Once   Chlorhexidine Gluconate Cloth  6 each Topical Daily   furosemide  40 mg Intravenous BID   metoprolol succinate  25 mg Oral Daily   pantoprazole  40 mg Oral Daily   polyethylene glycol  17 g Oral Daily   povidone-iodine  2 Application Topical Once   senna-docusate  1 tablet Oral BID     Data Reviewed:   CBG:  No results for  input(s): "GLUCAP" in the last 168 hours.  SpO2: 94 % O2 Flow Rate (L/min): 4 L/min    Vitals:   09/09/22 2015 09/10/22 0440 09/10/22 0808 09/10/22 1200  BP: 135/66 129/86 137/68 (!) 174/119  Pulse: 92 89 92 (!) 150  Resp: '17 14 17 19  '$ Temp: 98.1 F (36.7 C) 98.3 F (36.8 C) 98.1 F (36.7 C)   TempSrc:   Oral   SpO2: (!) 89% 96% 99% 94%  Weight:      Height:          Data Reviewed:  Basic Metabolic Panel: Recent Labs  Lab 09/08/22 0650 09/09/22 0423 09/10/22 0326  NA 136 134* 135  K 3.7 4.0 4.9  CL 99 97* 94*  CO2 '24 24 26  '$ GLUCOSE 152* 158* 160*  BUN 25* 30* 41*  CREATININE 1.13 1.63* 1.61*  CALCIUM 7.2* 6.9* 7.4*    CBC: Recent Labs  Lab 09/08/22 0650 09/09/22 0423 09/10/22 0326  WBC 13.7* 13.3* 12.1*  NEUTROABS 12.6*  --   --   HGB 12.8* 13.1 13.9  HCT 37.5* 39.2 41.5  MCV 88.2 91.0 89.8  PLT 172 157 153    LFT No results for input(s): "AST", "ALT", "ALKPHOS", "BILITOT", "PROT", "ALBUMIN" in the last 168 hours.   Antibiotics: Anti-infectives (From admission, onward)    Start     Dose/Rate Route Frequency Ordered Stop   09/10/22 0715  ceFAZolin (ANCEF) IVPB 2g/100 mL premix        2 g  200 mL/hr over 30 Minutes Intravenous On call to O.R. 09/10/22 [id number] 09/11/22 0559        DVT prophylaxis: SCDs  Code Status: Full code  Family Communication:    CONSULTS cardiology, orthopedics   Objective    Physical Examination:   General-appears in no acute distress Heart-S1-S2, regular, no murmur auscultated Lungs-clear to auscultation bilaterally, no wheezing or crackles auscultated Abdomen-soft, nontender, no organomegaly Extremities-no edema in the lower extremities Neuro-alert, oriented x3, no focal deficit noted  Status is: Inpatient:             Oswald Hillock   [facility] If 7PM-7AM, please contact night-coverage at [email or web address], Office  [phone]   09/10/2022, 12:47 PM  LOS: 2 days

## 2022-09-11 DIAGNOSIS — N401 Enlarged prostate with lower urinary tract symptoms: Secondary | ICD-10-CM | POA: Diagnosis not present

## 2022-09-11 DIAGNOSIS — I5021 Acute systolic (congestive) heart failure: Secondary | ICD-10-CM

## 2022-09-11 DIAGNOSIS — I10 Essential (primary) hypertension: Secondary | ICD-10-CM | POA: Diagnosis not present

## 2022-09-11 DIAGNOSIS — N179 Acute kidney failure, unspecified: Secondary | ICD-10-CM | POA: Diagnosis not present

## 2022-09-11 LAB — CBC
HCT: 42.1 % (ref 39.0–52.0)
Hemoglobin: 14.1 g/dL (ref 13.0–17.0)
MCH: 29.9 pg (ref 26.0–34.0)
MCHC: 33.5 g/dL (ref 30.0–36.0)
MCV: 89.4 fL (ref 80.0–100.0)
Platelets: 175 10*3/uL (ref 150–400)
RBC: 4.71 MIL/uL (ref 4.22–5.81)
RDW: 12.1 % (ref 11.5–15.5)
WBC: 10.5 10*3/uL (ref 4.0–10.5)
nRBC: 0 % (ref 0.0–0.2)

## 2022-09-11 LAB — BASIC METABOLIC PANEL
Anion gap: 15 (ref 5–15)
BUN: 46 mg/dL — ABNORMAL HIGH (ref 8–23)
CO2: 27 mmol/L (ref 22–32)
Calcium: 7.3 mg/dL — ABNORMAL LOW (ref 8.9–10.3)
Chloride: 95 mmol/L — ABNORMAL LOW (ref 98–111)
Creatinine, Ser: 1.66 mg/dL — ABNORMAL HIGH (ref 0.61–1.24)
GFR, Estimated: 40 mL/min — ABNORMAL LOW (ref >60)
Glucose, Bld: 142 mg/dL — ABNORMAL HIGH (ref 70–99)
Potassium: 3.5 mmol/L (ref 3.5–5.1)
Sodium: 137 mmol/L (ref 135–145)

## 2022-09-11 MED ORDER — MELATONIN 3 MG PO TABS
3.0000 mg | ORAL_TABLET | Freq: Every evening | ORAL | Status: DC | PRN
  Administered 2022-09-11: 3 mg via ORAL
  Filled 2022-09-11: qty 1

## 2022-09-11 MED ORDER — DAPAGLIFLOZIN PROPANEDIOL 10 MG PO TABS
10.0000 mg | ORAL_TABLET | Freq: Every day | ORAL | Status: DC
  Administered 2022-09-12: 10 mg via ORAL
  Filled 2022-09-11: qty 1

## 2022-09-11 MED ORDER — LIDOCAINE VISCOUS HCL 2 % MT SOLN
15.0000 mL | Freq: Four times a day (QID) | OROMUCOSAL | Status: AC | PRN
  Administered 2022-09-11 – 2022-09-12 (×2): 15 mL via OROMUCOSAL
  Filled 2022-09-11 (×3): qty 15

## 2022-09-11 MED ORDER — METHOCARBAMOL 500 MG PO TABS
500.0000 mg | ORAL_TABLET | Freq: Three times a day (TID) | ORAL | Status: DC | PRN
  Administered 2022-09-11: 500 mg via ORAL
  Filled 2022-09-11: qty 1

## 2022-09-11 NOTE — TOC CAGE-AID Note (Signed)
Transition of Care [facility]) - CAGE-AID Screening   Patient Details  Name: Perry Rosales MRN: [id number] Date of Birth: Sep 26, 1935  Transition of Care [facility]) CM/SW Contact:    Trudee Kuster, RN Phone Number: 09/11/2022, 2:45 AM   Clinical Narrative: No alcohol or drug use. Education not needed at this time.   CAGE-AID Screening:    Have You Ever Felt You Ought to Cut Down on Your Drinking or Drug Use?: No Have People Annoyed You By Critizing Your Drinking Or Drug Use?: No Have You Felt Bad Or Guilty About Your Drinking Or Drug Use?: No Have You Ever Had a Drink or Used Drugs First Thing In The Morning to Steady Your Nerves or to Get Rid of a Hangover?: No CAGE-AID Score: 0  Substance Abuse Education Offered: No

## 2022-09-11 NOTE — Evaluation (Signed)
Physical Therapy Evaluation Patient Details Name: Perry Rosales MRN: [id number] DOB: 07/25/1935 Today's Date: 09/11/2022  History of Present Illness  87 y.o. male admitted 3/12 after falling and fracturing his left hip. s/p cemented left total hip arthroplasty posterior approach 3/14. medical history significant of hypertension, GERD, BPH  Clinical Impression  Patient is s/p above surgery resulting in functional limitations due to the deficits listed below (see PT Problem List). Requires mod - Max assist with bed mobility and transfers, previously independent PTA. Limited mobility today with significant guarding and pain despite being pre-medicated. SpO2 88% on 1L supplemental O2, 95% on 3L when PT entered room. Pt motivated to work with PT and get OOB. Lives with wife, and son currently staying with them but works from home. Pt agreeable to SNF due to significant difficulty with mobility.  Patient will benefit from skilled PT to increase their independence and safety with mobility to allow discharge to the venue listed below.          Recommendations for follow up therapy are one component of a multi-disciplinary discharge planning process, led by the attending physician.  Recommendations may be updated based on patient status, additional functional criteria and insurance authorization.  Follow Up Recommendations Skilled nursing-short term rehab (<3 hours/day) Can patient physically be transported by private vehicle: No (likely soon)    Assistance Recommended at Discharge Frequent or constant Supervision/Assistance  Patient can return home with the following  Two people to help with walking and/or transfers;A lot of help with bathing/dressing/bathroom;Assistance with cooking/housework;Assist for transportation;Help with stairs or ramp for entrance    Equipment Recommendations None recommended by PT  Recommendations for Other Services       Functional Status Assessment Patient has had a  recent decline in their functional status and demonstrates the ability to make significant improvements in function in a reasonable and predictable amount of time.     Precautions / Restrictions Precautions Precautions: Fall Restrictions Weight Bearing Restrictions: Yes LLE Weight Bearing: Weight bearing as tolerated      Mobility  Bed Mobility Overal bed mobility: Needs Assistance Bed Mobility: Supine to Sit     Supine to sit: Mod assist, HOB elevated     General bed mobility comments: Heavy mod assist for LLE support and trunk to rise to EOB, patient able to pull through therapist hand. Very guarded due to pain.    Transfers Overall transfer level: Needs assistance Equipment used: Rolling walker (2 wheels) Transfers: Sit to/from Stand, Bed to chair/wheelchair/BSC Sit to Stand: Max assist, From elevated surface   Step pivot transfers: Mod assist       General transfer comment: Max assist for boost to stand from elevated bed surface. Heavy posterior lean, minimal WB tolerated through LLE. Cues for technique. Once upright able to take small shuffled/pivot steps towards Rt to sit in recliner with modassist for balance and RW progression during turn, max VC for sequencing.    Ambulation/Gait                  Stairs            Wheelchair Mobility    Modified Rankin (Stroke Patients Only)       Balance Overall balance assessment: Needs assistance, History of Falls Sitting-balance support: Single extremity supported, Feet supported Sitting balance-Leahy Scale: Poor   Postural control: Posterior lean Standing balance support: Bilateral upper extremity supported, Reliant on assistive device for balance Standing balance-Leahy Scale: Poor  Pertinent Vitals/Pain Pain Assessment Pain Assessment: 0-10 Pain Score: 5  Pain Location: Left hip Pain Descriptors / Indicators: Aching Pain Intervention(s): Monitored  during session, Premedicated before session, Repositioned, Limited activity within patient's tolerance    Home Living Family/patient expects to be discharged to:: Private residence Living Arrangements: Spouse/significant other Available Help at Discharge: Family;Available 24 hours/day Type of Home: House Home Access: Stairs to enter Entrance Stairs-Rails: Psychiatric nurse of Steps: 2   Home Layout: One level Home Equipment: Conservation officer, nature (2 wheels);Rollator (4 wheels);BSC/3in1;Shower seat;Wheelchair - manual      Prior Function Prior Level of Function : Independent/Modified Independent;Driving;History of Falls (last six months)             Mobility Comments: ind, helps wife, has house keepers assist with cleaning       Hand Dominance   Dominant Hand: Left    Extremity/Trunk Assessment   Upper Extremity Assessment Upper Extremity Assessment: Defer to OT evaluation    Lower Extremity Assessment Lower Extremity Assessment: LLE deficits/detail LLE Deficits / Details: Guarded as expected post op. Limited movement due to pain       Communication   Communication: No difficulties  Cognition Arousal/Alertness: Lethargic, Suspect due to medications Behavior During Therapy: WFL for tasks assessed/performed Overall Cognitive Status: Within Functional Limits for tasks assessed                                          General Comments General comments (skin integrity, edema, etc.): SpO2 88% on 1L supplemental O2, 95% on 3L.    Exercises Total Joint Exercises Ankle Circles/Pumps: AROM, Both, 10 reps, Supine Quad Sets: Strengthening, Both, 10 reps, Seated Gluteal Sets: Strengthening, Both, 10 reps, Seated Towel Squeeze: Strengthening, Both, 5 reps, Seated   Assessment/Plan    PT Assessment Patient needs continued PT services  PT Problem List Decreased strength;Decreased range of motion;Decreased activity tolerance;Decreased  balance;Decreased mobility;Decreased knowledge of use of DME;Decreased knowledge of precautions;Pain;Impaired tone       PT Treatment Interventions DME instruction;Gait training;Functional mobility training;Therapeutic activities;Therapeutic exercise;Balance training;Stair training;Neuromuscular re-education;Patient/family education;Modalities    PT Goals (Current goals can be found in the Care Plan section)  Acute Rehab PT Goals Patient Stated Goal: Get well PT Goal Formulation: With patient Time For Goal Achievement: 09/25/22 Potential to Achieve Goals: Good    Frequency Min 3X/week     Co-evaluation               AM-PAC PT "6 Clicks" Mobility  Outcome Measure Help needed turning from your back to your side while in a flat bed without using bedrails?: A Lot Help needed moving from lying on your back to sitting on the side of a flat bed without using bedrails?: A Lot Help needed moving to and from a bed to a chair (including a wheelchair)?: A Lot Help needed standing up from a chair using your arms (e.g., wheelchair or bedside chair)?: Total Help needed to walk in hospital room?: Total Help needed climbing 3-5 steps with a railing? : Total 6 Click Score: 9    End of Session Equipment Utilized During Treatment: Gait belt;Oxygen Activity Tolerance: Patient limited by pain Patient left: in chair;with call bell/phone within reach;with chair alarm set Nurse Communication: Need for lift equipment (NT - Stedy use +2) PT Visit Diagnosis: Unsteadiness on feet (R26.81);Other abnormalities of gait and mobility (R26.89);Muscle weakness (generalized) (M62.81);History of  falling (Z91.81);Difficulty in walking, not elsewhere classified (R26.2);Pain Pain - Right/Left: Left Pain - part of body: Hip    Time: [id number] PT Time Calculation (min) (ACUTE ONLY): 45 min   Charges:   PT Evaluation $PT Eval Low Complexity: 1 Low PT Treatments $Therapeutic Exercise: 8-22 mins $Therapeutic  Activity: 8-22 mins        Candie Mile, PT, DPT Physical Therapist [facility] [place] 09/11/2022, 12:01 PM

## 2022-09-11 NOTE — Care Management Important Message (Signed)
Important Message  Patient Details  Name: Perry Rosales MRN: [id number] Date of Birth: Sep 22, 1935   Medicare Important Message Given:  Yes     Hannah Beat 09/11/2022, 11:36 AM

## 2022-09-11 NOTE — Progress Notes (Signed)
[facility]  PROGRESS NOTE  Perry Rosales [id number] DOB: [birth year] DOA: 09/08/2022 PCP: Donnajean Lopes, MD   Brief HPI:   87 year old male with history of hypertension, GERD, BPH who lives with his demented wife, was brought to the ED after he was found on the floor.  Patient usually sleeps in the chair, he was going to bathroom at nighttime and lost balance and fell on his left side.  On presentation in the ED he was hypotensive with mild sinus tachycardia.  He was complaining of pain in the left hip.  X-ray of the hip showed acute subcapital left femoral neck fracture.  Orthopedic surgery was consulted.    Subjective   Patient seen and examined, denies chest pain or shortness of breath.   Assessment/Plan:    Closed left hip fracture -S/p fall, x-ray of the hip showed acute subcapital left femoral neck fracture -Orthopedic surgery consulted -Plan for ORIF today  Acute combined systolic and diastolic CHF -Echocardiogram showed EF of 40 to 45% with regional wall motion abnormality, grade 1 diastolic dysfunction, new onset cardiomyopathy with apical ballooning and basal hypokinesis -Started on low-dose metoprolol -He was started on Lasix 40 mg IV twice daily, which has been discontinued per cardiology -cardiology following  Acute kidney injury -Creatinine is 1.61 today, was 1.60 yesterday -Started on Lasix 40 mg IV twice daily -Lasix has been discontinued as above -Follow-up BMP in a.m.  Hypertension -Amlodipine discontinued -Blood pressure well-controlled, continue metoprolol  History of BPH -Continue tamsulosin  GERD -Continue PPI  Medications     amitriptyline  50 mg Oral QHS   aspirin EC  81 mg Oral BID   Chlorhexidine Gluconate Cloth  6 each Topical Daily   [START ON 09/12/2022] dapagliflozin propanediol  10 mg Oral Daily   metoprolol succinate  25 mg Oral Daily   pantoprazole  40 mg Oral Daily   polyethylene glycol  17 g Oral Daily    senna-docusate  1 tablet Oral BID     Data Reviewed:   CBG:  No results for input(s): "GLUCAP" in the last 168 hours.  SpO2: 99 % O2 Flow Rate (L/min): 3 L/min    Vitals:   09/10/22 2354 09/11/22 0452 09/11/22 0752 09/11/22 1339  BP: 127/69 (!) 109/59 128/69 113/71  Pulse: 83 90 94 94  Resp: 12 19 16    Temp: 97.6 F (36.4 C) 97.8 F (36.6 C) 98 F (36.7 C) 98.2 F (36.8 C)  TempSrc: Oral Oral Oral Oral  SpO2: 98% 99% 94% 99%  Weight:      Height:          Data Reviewed:  Basic Metabolic Panel: Recent Labs  Lab 09/08/22 0650 09/09/22 0423 09/10/22 0326 09/11/22 0433  NA 136 134* 135 137  K 3.7 4.0 4.9 3.5  CL 99 97* 94* 95*  CO2 24 24 26 27   GLUCOSE 152* 158* 160* 142*  BUN 25* 30* 41* 46*  CREATININE 1.13 1.63* 1.61* 1.66*  CALCIUM 7.2* 6.9* 7.4* 7.3*    CBC: Recent Labs  Lab 09/08/22 0650 09/09/22 0423 09/10/22 0326 09/11/22 0433  WBC 13.7* 13.3* 12.1* 10.5  NEUTROABS 12.6*  --   --   --   HGB 12.8* 13.1 13.9 14.1  HCT 37.5* 39.2 41.5 42.1  MCV 88.2 91.0 89.8 89.4  PLT 172 157 153 175    LFT No results for input(s): "AST", "ALT", "ALKPHOS", "BILITOT", "PROT", "ALBUMIN" in the last 168 hours.   Antibiotics: Anti-infectives (From admission,  onward)    Start     Dose/Rate Route Frequency Ordered Stop   09/10/22 2200  ceFAZolin (ANCEF) IVPB 2g/100 mL premix        2 g 200 mL/hr over 30 Minutes Intravenous Every 8 hours 09/10/22 1959 09/11/22 0643   09/10/22 0715  ceFAZolin (ANCEF) IVPB 2g/100 mL premix        2 g 200 mL/hr over 30 Minutes Intravenous On call to O.R. 09/10/22 0620 09/10/22 1615        DVT prophylaxis: SCDs  Code Status: Full code  Family Communication:    CONSULTS cardiology, orthopedics   Objective    Physical Examination:   General-appears in no acute distress Heart-S1-S2, regular, no murmur auscultated Lungs-clear to auscultation bilaterally, no wheezing or crackles auscultated Abdomen-soft,  nontender, no organomegaly Extremities-no edema in the lower extremities Neuro-alert, oriented x3, no focal deficit noted  Status is: Inpatient:             Oswald Hillock   [facility] If 7PM-7AM, please contact night-coverage at [email or web address], Office  [phone]   09/11/2022, 2:01 PM  LOS: 3 days

## 2022-09-11 NOTE — Progress Notes (Signed)
Rounding Note   Patient Name: Perry Rosales Date of Encounter: 09/11/2022  [place] Cardiologist: Evalina Field, MD   Subjective   Feeling better.  Happy that he could get some sleep. No CP/SOB.   Inpatient Medications    Scheduled Meds:  amitriptyline  50 mg Oral QHS   aspirin EC  81 mg Oral BID   Chlorhexidine Gluconate Cloth  6 each Topical Daily   furosemide  40 mg Intravenous BID   metoprolol succinate  25 mg Oral Daily   pantoprazole  40 mg Oral Daily   polyethylene glycol  17 g Oral Daily   senna-docusate  1 tablet Oral BID   Continuous Infusions:  methocarbamol (ROBAXIN) IV 500 mg (09/11/22 0408)   PRN Meds: hydrALAZINE, HYDROmorphone (DILAUDID) injection, lip balm, melatonin, methocarbamol (ROBAXIN) IV, methocarbamol, mouth rinse, oxyCODONE, phenol, polyvinyl alcohol   Vital Signs    Vitals:   09/10/22 2008 09/10/22 2354 09/11/22 0452 09/11/22 0752  BP:  127/69 (!) 109/59 128/69  Pulse:  83 90 94  Resp: 12 12 19 16   Temp: 98.2 F (36.8 C) 97.6 F (36.4 C) 97.8 F (36.6 C) 98 F (36.7 C)  TempSrc: Oral Oral Oral Oral  SpO2: 96% 98% 99% 94%  Weight:      Height:        Intake/Output Summary (Last 24 hours) at 09/11/2022 1213 Last data filed at 09/11/2022 0800 Gross per 24 hour  Intake 1070 ml  Output 2550 ml  Net -1480 ml      09/10/2022    2:38 PM 09/09/2022    6:24 PM 09/08/2022    5:05 AM  Last 3 Weights  Weight (lbs) 176 lb 179 lb 10.8 oz 158 lb 4.6 oz  Weight (kg) 79.833 kg 81.5 kg 71.8 kg      Telemetry    Sinus rhythm - Personally Reviewed  ECG    N/a - Personally Reviewed  Physical Exam   VS:  BP 128/69 (BP Location: Right Arm)   Pulse 94   Temp 98 F (36.7 C) (Oral)   Resp 16   Ht 5\' 9"  (1.753 m)   Wt 79.8 kg   SpO2 94%   BMI 25.99 kg/m  , BMI Body mass index is 25.99 kg/m. GENERAL:  Well appearing HEENT: Pupils equal round and reactive, fundi not visualized, oral mucosa unremarkable NECK:  No  jugular venous distention, waveform within normal limits, carotid upstroke brisk and symmetric, no bruits, no thyromegaly LUNGS:  Clear to auscultation bilaterally HEART:  RRR.  PMI not displaced or sustained,S1 and S2 within normal limits, no S3, no S4, no clicks, no rubs, no murmurs ABD:  Flat, positive bowel sounds normal in frequency in pitch, no bruits, no rebound, no guarding, no midline pulsatile mass, no hepatomegaly, no splenomegaly EXT:  2 plus pulses throughout, no edema, no cyanosis no clubbing SKIN:  No rashes no nodules NEURO:  Cranial nerves II through XII grossly intact, motor grossly intact throughout PSYCH:  Cognitively intact, oriented to person place and time   Labs    High Sensitivity Troponin:  No results for input(s): "TROPONINIHS" in the last 720 hours.   Chemistry Recent Labs  Lab 09/09/22 0423 09/10/22 0326 09/11/22 0433  NA 134* 135 137  K 4.0 4.9 3.5  CL 97* 94* 95*  CO2 24 26 27   GLUCOSE 158* 160* 142*  BUN 30* 41* 46*  CREATININE 1.63* 1.61* 1.66*  CALCIUM 6.9* 7.4* 7.3*  GFRNONAA  41* 42* 40*  ANIONGAP 13 15 15     Lipids No results for input(s): "CHOL", "TRIG", "HDL", "LABVLDL", "LDLCALC", "CHOLHDL" in the last 168 hours.  Hematology Recent Labs  Lab 09/09/22 0423 09/10/22 0326 09/11/22 0433  WBC 13.3* 12.1* 10.5  RBC 4.31 4.62 4.71  HGB 13.1 13.9 14.1  HCT 39.2 41.5 42.1  MCV 91.0 89.8 89.4  MCH 30.4 30.1 29.9  MCHC 33.4 33.5 33.5  RDW 12.4 12.0 12.1  PLT 157 153 175   Thyroid No results for input(s): "TSH", "FREET4" in the last 168 hours.  BNP Recent Labs  Lab 09/08/22 2101  BNP 507.3*    DDimer No results for input(s): "DDIMER" in the last 168 hours.   Radiology    DG HIP UNILAT W OR W/O PELVIS 2-3 VIEWS LEFT  Result Date: 09/10/2022 CLINICAL DATA:  [id number] Post-operative state [id number] EXAM: DG HIP (WITH OR WITHOUT PELVIS) 2-3V LEFT COMPARISON:  Left hip radiograph 09/08/2022 FINDINGS: Postsurgical changes of left total hip  arthroplasty. Normal alignment. No evidence of loosening or periprosthetic fracture. Expected soft tissue changes. 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 Normal alignment. No evidence of immediate complication. Electronically Signed   By: Maurine Simmering M.D.   On: 09/10/2022 18:51   DG Hand 2 View Right  Result Date: 09/09/2022 CLINICAL DATA:  Swelling EXAM: RIGHT HAND - 2 VIEW COMPARISON:  None Available. FINDINGS: There is an IV in the dorsum of the hand. Soft tissues are otherwise within normal limits. There is no acute fracture or dislocation identified. There is likely a healed fracture of the distal fifth tuft. IMPRESSION: 1. No acute fracture or dislocation. 2. Likely healed fracture of the distal fifth tuft. Electronically Signed   By: Ronney Asters M.D.   On: 09/09/2022 20:19    Cardiac Studies   Echo 09/08/22: 1. Left ventricular ejection fraction, by estimation, is 40 to 45%. The  left ventricle has mildly decreased function. The left ventricle  demonstrates regional wall motion abnormalities (LAD territory vs stress  cardiomyopathy). Left ventricular diastolic   parameters are consistent with Grade I diastolic dysfunction (impaired  relaxation).   2. Right ventricular systolic function is normal. The right ventricular  size is normal. There is moderately elevated pulmonary artery systolic  pressure.   3. The mitral valve is grossly normal. No evidence of mitral valve  regurgitation. No evidence of mitral stenosis.   4. The aortic valve is calcified. There is mild calcification of the  aortic valve. There is mild thickening of the aortic valve. Aortic valve  regurgitation is not visualized. No aortic stenosis is present.   Patient Profile     87 y.o. male with hypertension, tobacco abuse, and new onset systolic and diastolic HF.  Assessment & Plan    # Acute systolic and diastolic HF:  # Hypertension:  LVEF 40%.  He denies prior cardiac history.  He  did have LE edema on admission that has improved with diuresis. Now with AKI and he appears euvolemic.  Will stop bid IV lasix. Recommend repeat echo in one month as an outpatient and consider ischemic evaluation if LVEF remains depressed.  Continue metoprolol succinate.  Add ARB/Entresto as BP and renal function tolerate.  Will add [place].  # L hip fracture: S/p L hip replacement 3/14  # AKI:  Hold diuretics as above.       For questions or updates, please contact [place] Please consult [email or web address] for contact info under  Signed, Skeet Latch, MD  09/11/2022, 12:13 PM

## 2022-09-11 NOTE — Anesthesia Postprocedure Evaluation (Signed)
Anesthesia Post Note  Patient: Perry Rosales  Procedure(s) Performed: TOTAL HIP REPLACEMENT (Left: Hip)     Patient location during evaluation: PACU Anesthesia Type: MAC and Spinal Level of consciousness: awake and patient cooperative Pain management: pain level controlled Vital Signs Assessment: post-procedure vital signs reviewed and stable Respiratory status: spontaneous breathing, nonlabored ventilation, respiratory function stable and patient connected to nasal cannula oxygen Cardiovascular status: stable and blood pressure returned to baseline Postop Assessment: no apparent nausea or vomiting Anesthetic complications: no   No notable events documented.  Last Vitals:  Vitals:   09/11/22 0752 09/11/22 1339  BP: 128/69 113/71  Pulse: 94 94  Resp: 16   Temp: 36.7 C 36.8 C  SpO2: 94% 99%    Last Pain:  Vitals:   09/11/22 1442  TempSrc:   PainSc: 0-No pain                 [place]

## 2022-09-11 NOTE — Progress Notes (Signed)
     Subjective: Patient reports pain as moderate. Scared to move his hip. Had not slept well overnight. Added melatonin and robaxin. Denies distal n/t.  Plan for PT today.  Objective:   VITALS:   Vitals:   09/10/22 2007 09/10/22 2008 09/10/22 2354 09/11/22 0452  BP:   127/69 (!) 109/59  Pulse:   83 90  Resp: 15 12 12 19   Temp:  98.2 F (36.8 C) 97.6 F (36.4 C) 97.8 F (36.6 C)  TempSrc:  Oral Oral Oral  SpO2:  96% 98% 99%  Weight:      Height:        Sensation intact distally Intact pulses distally Dorsiflexion/Plantar flexion intact Incision: dressing C/D/I Compartment soft   Lab Results  Component Value Date   WBC 10.5 09/11/2022   HGB 14.1 09/11/2022   HCT 42.1 09/11/2022   MCV 89.4 09/11/2022   PLT 175 09/11/2022   BMET    Component Value Date/Time   NA 137 09/11/2022 0433   K 3.5 09/11/2022 0433   CL 95 (L) 09/11/2022 0433   CO2 27 09/11/2022 0433   GLUCOSE 142 (H) 09/11/2022 0433   BUN 46 (H) 09/11/2022 0433   CREATININE 1.66 (H) 09/11/2022 0433   CALCIUM 7.3 (L) 09/11/2022 0433   GFRNONAA 40 (L) 09/11/2022 0433      Xray: THA components in good position no adverse features.  Assessment/Plan: 1 Day Post-Op   Principal Problem:   Closed left hip fracture (HCC) Active Problems:   Benign prostatic hyperplasia with lower urinary tract symptoms   Essential hypertension   Gastro-esophageal reflux disease without esophagitis   Leg edema  S/p L THA 3/14  Post op recs: WB: WBAT LLE, No formal hip precautions Abx: ancef Imaging: PACU pelvis Xray Dressing: Aquacell, keep intact until follow up DVT prophylaxis: Aspirin 81BID starting POD1 Follow up: 2 weeks after surgery for a wound check with Dr. Zachery Dakins at [facility].  Address: [address] [place], [place], [place] [address]  Office Phone: [phone]   Perry Rosales 09/11/2022, 6:43 AM   Charlies Constable, MD  Contact information:   [phone] 7am-5pm epic  message Dr. Zachery Dakins, or call office for patient follow up: [phone] After hours and holidays please check [email or web address] for group call information for [facility]

## 2022-09-12 ENCOUNTER — Encounter (HOSPITAL_COMMUNITY): Payer: Self-pay | Admitting: Physical Medicine & Rehabilitation

## 2022-09-12 ENCOUNTER — Inpatient Hospital Stay (HOSPITAL_COMMUNITY)
Admission: RE | Admit: 2022-09-12 | Discharge: 2022-09-18 | DRG: 559 | Disposition: A | Discharge status: Home Health | Payer: Medicare Other | Source: Intra-hospital | Attending: Physical Medicine & Rehabilitation | Admitting: Physical Medicine & Rehabilitation

## 2022-09-12 ENCOUNTER — Other Ambulatory Visit: Payer: Self-pay

## 2022-09-12 DIAGNOSIS — K121 Other forms of stomatitis: Secondary | ICD-10-CM | POA: Diagnosis present

## 2022-09-12 DIAGNOSIS — Z87891 Personal history of nicotine dependence: Secondary | ICD-10-CM

## 2022-09-12 DIAGNOSIS — N400 Enlarged prostate without lower urinary tract symptoms: Secondary | ICD-10-CM | POA: Diagnosis present

## 2022-09-12 DIAGNOSIS — S72002A Fracture of unspecified part of neck of left femur, initial encounter for closed fracture: Principal | ICD-10-CM | POA: Diagnosis present

## 2022-09-12 DIAGNOSIS — I10 Essential (primary) hypertension: Secondary | ICD-10-CM | POA: Diagnosis not present

## 2022-09-12 DIAGNOSIS — K5900 Constipation, unspecified: Secondary | ICD-10-CM | POA: Diagnosis present

## 2022-09-12 DIAGNOSIS — Z79899 Other long term (current) drug therapy: Secondary | ICD-10-CM | POA: Diagnosis not present

## 2022-09-12 DIAGNOSIS — I110 Hypertensive heart disease with heart failure: Secondary | ICD-10-CM | POA: Diagnosis present

## 2022-09-12 DIAGNOSIS — Z7982 Long term (current) use of aspirin: Secondary | ICD-10-CM

## 2022-09-12 DIAGNOSIS — S72002D Fracture of unspecified part of neck of left femur, subsequent encounter for closed fracture with routine healing: Secondary | ICD-10-CM

## 2022-09-12 DIAGNOSIS — K219 Gastro-esophageal reflux disease without esophagitis: Secondary | ICD-10-CM | POA: Diagnosis present

## 2022-09-12 DIAGNOSIS — Z961 Presence of intraocular lens: Secondary | ICD-10-CM | POA: Diagnosis present

## 2022-09-12 DIAGNOSIS — N401 Enlarged prostate with lower urinary tract symptoms: Secondary | ICD-10-CM | POA: Diagnosis not present

## 2022-09-12 DIAGNOSIS — I5043 Acute on chronic combined systolic (congestive) and diastolic (congestive) heart failure: Secondary | ICD-10-CM | POA: Diagnosis present

## 2022-09-12 DIAGNOSIS — Z96642 Presence of left artificial hip joint: Secondary | ICD-10-CM | POA: Diagnosis present

## 2022-09-12 DIAGNOSIS — Z471 Aftercare following joint replacement surgery: Principal | ICD-10-CM

## 2022-09-12 DIAGNOSIS — Z9049 Acquired absence of other specified parts of digestive tract: Secondary | ICD-10-CM

## 2022-09-12 DIAGNOSIS — I5021 Acute systolic (congestive) heart failure: Secondary | ICD-10-CM | POA: Diagnosis not present

## 2022-09-12 DIAGNOSIS — S72002S Fracture of unspecified part of neck of left femur, sequela: Secondary | ICD-10-CM | POA: Diagnosis not present

## 2022-09-12 HISTORY — DX: Fracture of unspecified part of neck of left femur, initial encounter for closed fracture: S72.002A

## 2022-09-12 LAB — BASIC METABOLIC PANEL
Anion gap: 11 (ref 5–15)
Anion gap: 13 (ref 5–15)
BUN: 58 mg/dL — ABNORMAL HIGH (ref 8–23)
BUN: 52 mg/dL — ABNORMAL HIGH (ref 8–23)
CO2: 29 mmol/L (ref 22–32)
CO2: 29 mmol/L (ref 22–32)
Calcium: 6.9 mg/dL — ABNORMAL LOW (ref 8.9–10.3)
Calcium: 7.3 mg/dL — ABNORMAL LOW (ref 8.9–10.3)
Chloride: 95 mmol/L — ABNORMAL LOW (ref 98–111)
Chloride: 97 mmol/L — ABNORMAL LOW (ref 98–111)
Creatinine, Ser: 1.72 mg/dL — ABNORMAL HIGH (ref 0.61–1.24)
Creatinine, Ser: 1.43 mg/dL — ABNORMAL HIGH (ref 0.61–1.24)
GFR, Estimated: 39 mL/min — ABNORMAL LOW (ref >60)
GFR, Estimated: 48 mL/min — ABNORMAL LOW (ref >60)
Glucose, Bld: 147 mg/dL — ABNORMAL HIGH (ref 70–99)
Glucose, Bld: 132 mg/dL — ABNORMAL HIGH (ref 70–99)
Potassium: 4 mmol/L (ref 3.5–5.1)
Potassium: 3.6 mmol/L (ref 3.5–5.1)
Sodium: 135 mmol/L (ref 135–145)
Sodium: 139 mmol/L (ref 135–145)

## 2022-09-12 MED ORDER — TRAZODONE HCL 50 MG PO TABS
25.0000 mg | ORAL_TABLET | Freq: Every evening | ORAL | Status: DC | PRN
  Administered 2022-09-12: 50 mg via ORAL
  Filled 2022-09-12: qty 1

## 2022-09-12 MED ORDER — PROCHLORPERAZINE 25 MG RE SUPP: 12.5000 mg | Freq: Four times a day (QID) | RECTAL | Status: DC | PRN

## 2022-09-12 MED ORDER — BISACODYL 10 MG RE SUPP: 10.0000 mg | Freq: Every day | RECTAL | Status: DC | PRN

## 2022-09-12 MED ORDER — POLYETHYLENE GLYCOL 3350 17 G PO PACK
17.0000 g | PACK | Freq: Every day | ORAL | Status: DC
  Administered 2022-09-13: 17 g via ORAL
  Filled 2022-09-12: qty 1

## 2022-09-12 MED ORDER — ACETAMINOPHEN 500 MG PO TABS
1000.0000 mg | ORAL_TABLET | Freq: Three times a day (TID) | ORAL | Status: DC
  Administered 2022-09-12 – 2022-09-18 (×17): 1000 mg via ORAL
  Filled 2022-09-12 (×17): qty 2

## 2022-09-12 MED ORDER — METOPROLOL SUCCINATE ER 25 MG PO TB24
25.0000 mg | ORAL_TABLET | Freq: Every day | ORAL | Status: DC
  Administered 2022-09-13 – 2022-09-18 (×6): 25 mg via ORAL
  Filled 2022-09-12 (×6): qty 1

## 2022-09-12 MED ORDER — DIPHENHYDRAMINE HCL 12.5 MG/5ML PO ELIX: 12.5000 mg | ORAL_SOLUTION | Freq: Four times a day (QID) | ORAL | Status: DC | PRN

## 2022-09-12 MED ORDER — ALUM & MAG HYDROXIDE-SIMETH 200-200-20 MG/5ML PO SUSP: 30.0000 mL | ORAL | Status: DC | PRN

## 2022-09-12 MED ORDER — METHOCARBAMOL 500 MG PO TABS
500.0000 mg | ORAL_TABLET | Freq: Three times a day (TID) | ORAL | Status: DC | PRN
  Administered 2022-09-12 – 2022-09-15 (×5): 500 mg via ORAL
  Filled 2022-09-12 (×5): qty 1

## 2022-09-12 MED ORDER — DAPAGLIFLOZIN PROPANEDIOL 10 MG PO TABS
10.0000 mg | ORAL_TABLET | Freq: Every day | ORAL | Status: DC
  Administered 2022-09-13 – 2022-09-18 (×6): 10 mg via ORAL
  Filled 2022-09-12 (×6): qty 1

## 2022-09-12 MED ORDER — MELATONIN 3 MG PO TABS
3.0000 mg | ORAL_TABLET | Freq: Every evening | ORAL | Status: DC | PRN
  Administered 2022-09-12 – 2022-09-17 (×5): 3 mg via ORAL
  Filled 2022-09-12 (×5): qty 1

## 2022-09-12 MED ORDER — MELATONIN 3 MG PO TABS: 3.0000 mg | ORAL_TABLET | Freq: Every evening | ORAL | 0 refills | Status: DC | PRN

## 2022-09-12 MED ORDER — DAPAGLIFLOZIN PROPANEDIOL 10 MG PO TABS: 10.0000 mg | ORAL_TABLET | Freq: Every day | ORAL | Status: DC

## 2022-09-12 MED ORDER — PANTOPRAZOLE SODIUM 40 MG PO TBEC
40.0000 mg | DELAYED_RELEASE_TABLET | Freq: Every day | ORAL | Status: DC
  Administered 2022-09-12 – 2022-09-18 (×7): 40 mg via ORAL
  Filled 2022-09-12 (×7): qty 1

## 2022-09-12 MED ORDER — PROCHLORPERAZINE EDISYLATE 10 MG/2ML IJ SOLN: 5.0000 mg | Freq: Four times a day (QID) | INTRAMUSCULAR | Status: DC | PRN

## 2022-09-12 MED ORDER — FLEET ENEMA 7-19 GM/118ML RE ENEM: 1.0000 | ENEMA | Freq: Once | RECTAL | Status: DC | PRN

## 2022-09-12 MED ORDER — ASPIRIN 81 MG PO TBEC
81.0000 mg | DELAYED_RELEASE_TABLET | Freq: Two times a day (BID) | ORAL | Status: DC
  Administered 2022-09-12 – 2022-09-18 (×12): 81 mg via ORAL
  Filled 2022-09-12 (×12): qty 1

## 2022-09-12 MED ORDER — OXYCODONE HCL 5 MG PO TABS
5.0000 mg | ORAL_TABLET | Freq: Four times a day (QID) | ORAL | Status: DC | PRN
  Administered 2022-09-12 – 2022-09-13 (×2): 10 mg via ORAL
  Administered 2022-09-13: 5 mg via ORAL
  Administered 2022-09-14 – 2022-09-17 (×7): 10 mg via ORAL
  Filled 2022-09-12 (×12): qty 2

## 2022-09-12 MED ORDER — POLYETHYLENE GLYCOL 3350 17 G PO PACK: 17.0000 g | PACK | Freq: Every day | ORAL | 0 refills | Status: DC

## 2022-09-12 MED ORDER — LIDOCAINE VISCOUS HCL 2 % MT SOLN
15.0000 mL | Freq: Four times a day (QID) | OROMUCOSAL | Status: DC | PRN
  Administered 2022-09-12 – 2022-09-13 (×2): 15 mL via OROMUCOSAL
  Filled 2022-09-12 (×3): qty 15

## 2022-09-12 MED ORDER — SENNOSIDES-DOCUSATE SODIUM 8.6-50 MG PO TABS
1.0000 | ORAL_TABLET | Freq: Two times a day (BID) | ORAL | Status: DC
  Administered 2022-09-12 – 2022-09-16 (×6): 1 via ORAL
  Filled 2022-09-12 (×9): qty 1

## 2022-09-12 MED ORDER — ASPIRIN 81 MG PO TBEC: 81.0000 mg | DELAYED_RELEASE_TABLET | Freq: Two times a day (BID) | ORAL | 0 refills | Status: AC

## 2022-09-12 MED ORDER — GUAIFENESIN-DM 100-10 MG/5ML PO SYRP: 5.0000 mL | ORAL_SOLUTION | Freq: Four times a day (QID) | ORAL | Status: DC | PRN

## 2022-09-12 MED ORDER — SENNOSIDES-DOCUSATE SODIUM 8.6-50 MG PO TABS: 1.0000 | ORAL_TABLET | Freq: Two times a day (BID) | ORAL | Status: DC

## 2022-09-12 MED ORDER — PROCHLORPERAZINE MALEATE 5 MG PO TABS: 5.0000 mg | ORAL_TABLET | Freq: Four times a day (QID) | ORAL | Status: DC | PRN

## 2022-09-12 MED ORDER — METOPROLOL SUCCINATE ER 25 MG PO TB24: 25.0000 mg | ORAL_TABLET | Freq: Every day | ORAL | Status: DC

## 2022-09-12 MED ORDER — TAMSULOSIN HCL 0.4 MG PO CAPS
0.4000 mg | ORAL_CAPSULE | Freq: Every day | ORAL | Status: DC
  Administered 2022-09-12 – 2022-09-17 (×6): 0.4 mg via ORAL
  Filled 2022-09-12 (×6): qty 1

## 2022-09-12 MED ORDER — METHOCARBAMOL 500 MG PO TABS: 500.0000 mg | ORAL_TABLET | Freq: Three times a day (TID) | ORAL | Status: DC | PRN

## 2022-09-12 MED ORDER — AMITRIPTYLINE HCL 25 MG PO TABS
50.0000 mg | ORAL_TABLET | Freq: Every day | ORAL | Status: DC
  Administered 2022-09-12 – 2022-09-17 (×6): 50 mg via ORAL
  Filled 2022-09-12 (×6): qty 2

## 2022-09-12 NOTE — Plan of Care (Signed)
No changes, pending renal fxn improvement to add arb/entresto. If no improvement on this admission, can be started as an outpatient. Can reach out closer to dispo for FU arrangements

## 2022-09-12 NOTE — Progress Notes (Signed)
Inpatient Rehabilitation Admission Medication Review by a Pharmacist  A complete drug regimen review was completed for this patient to identify any potential clinically significant medication issues.  High Risk Drug Classes Is patient taking? Indication by Medication  Antipsychotic Yes Compazine - n/v  Anticoagulant No   Antibiotic No   Opioid Yes Oxycodone - pain  Antiplatelet Yes ASA BID x30d then qday - DVT px  Hypoglycemics/insulin Yes Farxiga - CHF  Vasoactive Medication Yes Toprol - CHF  Chemotherapy No   Other Yes Elavil - depression Protonix - GERD Flomax - BPH Trazodone - sleep Robaxin - spasms Melatonin - sleep     Type of Medication Issue Identified Description of Issue Recommendation(s)  Drug Interaction(s) (clinically significant)     Duplicate Therapy     Allergy     No Medication Administration End Date     Incorrect Dose     Additional Drug Therapy Needed     Significant med changes from prior encounter (inform family/care partners about these prior to discharge).    Other       Clinically significant medication issues were identified that warrant physician communication and completion of prescribed/recommended actions by midnight of the next day:  No  Name of provider notified for urgent issues identified:   Provider Method of Notification:     Pharmacist comments:   Time spent performing this drug regimen review (minutes):  [place], PharmD, [place], AAHIVP, CPP Infectious Disease Pharmacist 09/12/2022 3:51 PM

## 2022-09-12 NOTE — Progress Notes (Signed)
Inpatient Rehab Admissions Coordinator:    I have a CIR bed for this Pt and will admit him today. RN may call report to [place], [place], [place] Admissions Coordinator  [phone] ([place]) [phone] (office)

## 2022-09-12 NOTE — Discharge Summary (Signed)
Physician Discharge Summary  Perry Rosales [id number] DOB: [birth year] DOA: 09/08/2022  PCP: Donnajean Lopes, MD  Admit date: 09/08/2022 Discharge date: 09/12/2022  Time spent: 60 minutes  Recommendations for Outpatient Follow-up:  Follow up 2 weeks after surgery for a wound check with Dr. Zachery Dakins at [facility].  Continue aspirin 81 mg p.o. twice daily for 30 days for DVT prophylaxis as per orthopedics, after 30 days can switch back to aspirin 81 mg p.o. daily   Discharge Diagnoses:  Principal Problem:   Closed left hip fracture (HCC) Active Problems:   Benign prostatic hyperplasia with lower urinary tract symptoms   Essential hypertension   Gastro-esophageal reflux disease without esophagitis   Leg edema   Acute systolic heart failure ([place])   Discharge Condition: Stable  Diet recommendation: Heart healthy diet  Filed Weights   09/08/22 0505 09/09/22 1824 09/10/22 1438  Weight: 71.8 kg 81.5 kg 79.8 kg    History of present illness:  87 year old male with history of hypertension, GERD, BPH who lives with his demented wife, was brought to the ED after he was found on the floor. Patient usually sleeps in the chair, he was going to bathroom at nighttime and lost balance and fell on his left side. On presentation in the ED he was hypotensive with mild sinus tachycardia. He was complaining of pain in the left hip. X-ray of the hip showed acute subcapital left femoral neck fracture. Orthopedic surgery was consulted.   Hospital Course:  Closed left hip fracture -S/p ORIF -S/p fall, x-ray of the hip showed acute subcapital left femoral neck fracture -Orthopedics has signed off.  Will follow-up in 2 weeks in office   Acute combined systolic and diastolic CHF -Echocardiogram showed EF of 40 to 45% with regional wall motion abnormality, grade 1 diastolic dysfunction, new onset cardiomyopathy with apical ballooning and basal hypokinesis -Started on low-dose  metoprolol -Started on Farxiga -He was started on Lasix 40 mg IV twice daily, which has been discontinued per cardiology -cardiology following   Acute kidney injury -Creatinine is 1.61 today, was 1.60 yesterday -Started on Lasix 40 mg IV twice daily -Lasix has been discontinued as above -Creatinine stable at 1.72   Hypertension -Amlodipine discontinued -Blood pressure well-controlled, continue metoprolol   History of BPH -Continue tamsulosin   GERD -Continue PPI   Procedures: Echocardiogram  Consultations: Cardiology  Discharge Exam: Vitals:   09/12/22 0809 09/12/22 0957  BP: 131/64 (!) 117/57  Pulse: 84 84  Resp:  18  Temp:  (!) 97.5 F (36.4 C)  SpO2:  97%    General: Appears in no acute distress Cardiovascular: S1-S2, regular, no murmur auscultated Respiratory: Lungs clear to auscultation bilaterally  Discharge Instructions   Discharge Instructions     Diet - low sodium heart healthy   Complete by: As directed    Increase activity slowly   Complete by: As directed       Allergies as of 09/12/2022   No Known Allergies      Medication List     STOP taking these medications    amLODipine 5 MG tablet Commonly known as: NORVASC   aspirin 81 MG tablet Replaced by: aspirin EC 81 MG tablet       TAKE these medications    amitriptyline 50 MG tablet Commonly known as: ELAVIL Take 50 mg by mouth at bedtime.   aspirin EC 81 MG tablet Take 1 tablet (81 mg total) by mouth 2 (two) times daily. Swallow whole. Replaces:  aspirin 81 MG tablet   dapagliflozin propanediol 10 MG Tabs tablet Commonly known as: FARXIGA Take 1 tablet (10 mg total) by mouth daily. Start taking on: September 13, 2022   melatonin 3 MG Tabs tablet Take 1 tablet (3 mg total) by mouth at bedtime as needed.   methocarbamol 500 MG tablet Commonly known as: ROBAXIN Take 1 tablet (500 mg total) by mouth every 8 (eight) hours as needed for muscle spasms.   metoprolol succinate  25 MG 24 hr tablet Commonly known as: TOPROL-XL Take 1 tablet (25 mg total) by mouth daily. Start taking on: September 13, 2022   omeprazole 20 MG capsule Commonly known as: PRILOSEC Take 20 mg by mouth daily.   polyethylene glycol 17 g packet Commonly known as: MIRALAX / GLYCOLAX Take 17 g by mouth daily. Start taking on: September 13, 2022   senna-docusate 8.6-50 MG tablet Commonly known as: Senokot-S Take 1 tablet by mouth 2 (two) times daily.   tamsulosin 0.4 MG Caps capsule Commonly known as: FLOMAX Take 0.4 mg by mouth in the morning and at bedtime.   traMADol-acetaminophen 37.5-325 MG tablet Commonly known as: ULTRACET Take 1-2 tablets by mouth See admin instructions. 1 tablet in the morning, 1 tablet at lunch, and 2 tablet in the evening       No Known Allergies  Follow-up Information     Willaim Sheng, MD Follow up in 2 week(s).   Specialty: Orthopedic Surgery Contact information: [place] [address] [phone]                  The results of significant diagnostics from this hospitalization (including imaging, microbiology, ancillary and laboratory) are listed below for reference.    Significant Diagnostic Studies: DG HIP UNILAT W OR W/O PELVIS 2-3 VIEWS LEFT  Result Date: 09/10/2022 CLINICAL DATA:  [id number] Post-operative state 252351 EXAM: DG HIP (WITH OR WITHOUT PELVIS) 2-3V LEFT COMPARISON:  Left hip radiograph 09/08/2022 FINDINGS: Postsurgical changes of left total hip arthroplasty. Normal alignment. No evidence of loosening or periprosthetic fracture. Expected soft tissue changes. 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 Normal alignment. No evidence of immediate complication. Electronically Signed   By: Maurine Simmering M.D.   On: 09/10/2022 18:51   DG Hand 2 View Right  Result Date: 09/09/2022 CLINICAL DATA:  Swelling EXAM: RIGHT HAND - 2 VIEW COMPARISON:  None Available. FINDINGS: There is an IV  in the dorsum of the hand. Soft tissues are otherwise within normal limits. There is no acute fracture or dislocation identified. There is likely a healed fracture of the distal fifth tuft. IMPRESSION: 1. No acute fracture or dislocation. 2. Likely healed fracture of the distal fifth tuft. Electronically Signed   By: Ronney Asters M.D.   On: 09/09/2022 20:19   ECHOCARDIOGRAM COMPLETE  Result Date: 09/08/2022    ECHOCARDIOGRAM REPORT   Patient Name:   Perry Rosales Date of Exam: 09/08/2022 Medical Rec #:  [id number]      Height:       71.0 in Accession #:    [id number]     Weight:       158.3 lb Date of Birth:  1935-10-10      BSA:          1.909 m Patient Age:    55 years       BP:           143/89 mmHg Patient Gender: M  HR:           106 bpm. Exam Location:  Inpatient Procedure: 2D Echo and Intracardiac Opacification Agent Indications:    CHF  History:        Patient has prior history of Echocardiogram examinations, most                 recent 02/10/2021. Risk Factors:Current Smoker and Hypertension.  Sonographer:    Harvie Junior Referring Phys: [id number] AMRIT ADHIKARI  Sonographer Comments: Technically difficult study due to poor echo windows. Image acquisition challenging due to respiratory motion and positioning. IMPRESSIONS  1. Left ventricular ejection fraction, by estimation, is 40 to 45%. The left ventricle has mildly decreased function. The left ventricle demonstrates regional wall motion abnormalities (LAD territory vs stress cardiomyopathy). Left ventricular diastolic  parameters are consistent with Grade I diastolic dysfunction (impaired relaxation).  2. Right ventricular systolic function is normal. The right ventricular size is normal. There is moderately elevated pulmonary artery systolic pressure.  3. The mitral valve is grossly normal. No evidence of mitral valve regurgitation. No evidence of mitral stenosis.  4. The aortic valve is calcified. There is mild calcification of the aortic  valve. There is mild thickening of the aortic valve. Aortic valve regurgitation is not visualized. No aortic stenosis is present. Comparison(s): Prior images reviewed side by side. Wall motion chagnes new from 2022; both studies does with echo-contrast. FINDINGS  Left Ventricle: Left ventricular ejection fraction, by estimation, is 40 to 45%. The left ventricle has mildly decreased function. The left ventricle demonstrates regional wall motion abnormalities. Definity contrast agent was given IV to delineate the left ventricular endocardial borders. 3D left ventricular ejection fraction analysis performed but not reported based on interpreter judgement due to suboptimal tracking. The left ventricular internal cavity size was normal in size. There is no left ventricular hypertrophy. Left ventricular diastolic parameters are consistent with Grade I diastolic dysfunction (impaired relaxation).  LV Wall Scoring: The apex is akinetic. The apical lateral segment, apical septal segment, apical anterior segment, and apical inferior segment are hypokinetic. Right Ventricle: The right ventricular size is normal. No increase in right ventricular wall thickness. Right ventricular systolic function is normal. There is moderately elevated pulmonary artery systolic pressure. The tricuspid regurgitant velocity is 3.34 m/s, and with an assumed right atrial pressure of 3 mmHg, the estimated right ventricular systolic pressure is 0000000 mmHg. Left Atrium: Left atrial size was normal in size. Right Atrium: Right atrial size was normal in size. Pericardium: There is no evidence of pericardial effusion. Mitral Valve: The mitral valve is grossly normal. No evidence of mitral valve regurgitation. No evidence of mitral valve stenosis. Tricuspid Valve: The tricuspid valve is not well visualized. Tricuspid valve regurgitation is not demonstrated. No evidence of tricuspid stenosis. Aortic Valve: The aortic valve is calcified. There is mild  calcification of the aortic valve. There is mild thickening of the aortic valve. There is mild aortic valve annular calcification. Aortic valve regurgitation is not visualized. No aortic stenosis  is present. Aortic valve mean gradient measures 7.0 mmHg. Aortic valve peak gradient measures 12.7 mmHg. Aortic valve area, by VTI measures 1.71 cm. Pulmonic Valve: The pulmonic valve was not well visualized. Pulmonic valve regurgitation is not visualized. No evidence of pulmonic stenosis. Aorta: The aortic root and ascending aorta are structurally normal, with no evidence of dilitation. IAS/Shunts: The interatrial septum appears to be lipomatous. The interatrial septum was not well visualized.  LEFT VENTRICLE PLAX 2D LVIDd:  4.40 cm      Diastology LVIDs:         3.30 cm      LV e' medial:    6.42 cm/s LV PW:         1.00 cm      LV E/e' medial:  6.0 LV IVS:        1.00 cm      LV e' lateral:   10.00 cm/s LVOT diam:     2.20 cm      LV E/e' lateral: 3.9 LV SV:         49 LV SV Index:   25 LVOT Area:     3.80 cm                              3D Volume EF: LV Volumes (MOD)            3D EF:        62 % LV vol d, MOD A2C: 108.0 ml LV EDV:       178 ml LV vol d, MOD A4C: 127.5 ml LV ESV:       67 ml LV vol s, MOD A2C: 58.8 ml  LV SV:        111 ml LV vol s, MOD A4C: 65.6 ml LV SV MOD A2C:     49.2 ml LV SV MOD A4C:     127.5 ml LV SV MOD BP:      57.8 ml RIGHT VENTRICLE RV Basal diam:  2.80 cm RV Mid diam:    2.10 cm RV S prime:     19.20 cm/s TAPSE (M-mode): 1.9 cm LEFT ATRIUM             Index        RIGHT ATRIUM          Index LA diam:        3.30 cm 1.73 cm/m   RA Area:     9.88 cm LA Vol (A2C):   37.8 ml 19.80 ml/m  RA Volume:   20.30 ml 10.63 ml/m LA Vol (A4C):   37.6 ml 19.70 ml/m LA Biplane Vol: 39.0 ml 20.43 ml/m  AORTIC VALVE                     PULMONIC VALVE AV Area (Vmax):    1.77 cm      PV Vmax:       1.15 m/s AV Area (Vmean):   1.72 cm      PV Peak grad:  5.3 mmHg AV Area (VTI):     1.71 cm AV  Vmax:           178.50 cm/s AV Vmean:          121.500 cm/s AV VTI:            0.284 m AV Peak Grad:      12.7 mmHg AV Mean Grad:      7.0 mmHg LVOT Vmax:         83.30 cm/s LVOT Vmean:        54.900 cm/s LVOT VTI:          0.128 m LVOT/AV VTI ratio: 0.45  AORTA Ao Root diam: 3.30 cm Ao Asc diam:  3.70 cm MITRAL VALVE                TRICUSPID VALVE MV Area (PHT):  5.13 cm     TR Peak grad:   44.6 mmHg MV Decel Time: 148 msec     TR Vmax:        334.00 cm/s MR Peak grad: 43.6 mmHg MR Vmax:      330.00 cm/s   SHUNTS MV E velocity: 38.80 cm/s   Systemic VTI:  0.13 m MV A velocity: 105.00 cm/s  Systemic Diam: 2.20 cm MV E/A ratio:  0.37 Rudean Haskell MD Electronically signed by Rudean Haskell MD Signature Date/Time: 09/08/2022/4:21:57 PM    Final    DG Chest Port 1 View  Result Date: 09/08/2022 CLINICAL DATA:  Pain and shortness of breath.  Hypertension. EXAM: PORTABLE CHEST 1 VIEW COMPARISON:  CT 12/05/2021 FINDINGS: Normal heart size. Aortic atherosclerotic calcifications. No pleural fluid or airspace disease. The visualized osseous structures are unremarkable. IMPRESSION: No active disease. Electronically Signed   By: Kerby Moors M.D.   On: 09/08/2022 07:22   DG Hip Unilat W or Wo Pelvis 2-3 Views Left  Result Date: 09/08/2022 CLINICAL DATA:  Status post fall. EXAM: DG HIP (WITH OR WITHOUT PELVIS) 2-3V LEFT COMPARISON:  None Available. FINDINGS: There is an acute fracture deformity involving the subcapital left femoral neck. Superior displacement of the distal fracture fragments identified. Right hip intact. IMPRESSION: Acute subcapital left femoral neck fracture. Electronically Signed   By: Kerby Moors M.D.   On: 09/08/2022 05:47    Microbiology: Recent Results (from the past 240 hour(s))  Surgical pcr screen     Status: None   Collection Time: 09/10/22 12:22 AM   Specimen: Nasal Mucosa; Nasal Swab  Result Value Ref Range Status   MRSA, PCR NEGATIVE NEGATIVE Final   Staphylococcus  aureus NEGATIVE NEGATIVE Final    Comment: (NOTE) The Xpert SA Assay (FDA approved for NASAL specimens in patients 69 years of age and older), is one component of a comprehensive surveillance program. It is not intended to diagnose infection nor to guide or monitor treatment. Performed at [facility], [place] [address], [place], [place] [address]      Labs: Basic Metabolic Panel: Recent Labs  Lab 09/08/22 0650 09/09/22 0423 09/10/22 0326 09/11/22 0433 09/12/22 0217  NA 136 134* 135 137 135  K 3.7 4.0 4.9 3.5 4.0  CL 99 97* 94* 95* 95*  CO2 24 24 26 27 29   GLUCOSE 152* 158* 160* 142* 147*  BUN 25* 30* 41* 46* 58*  CREATININE 1.13 1.63* 1.61* 1.66* 1.72*  CALCIUM 7.2* 6.9* 7.4* 7.3* 6.9*   Liver Function Tests: No results for input(s): "AST", "ALT", "ALKPHOS", "BILITOT", "PROT", "ALBUMIN" in the last 168 hours. No results for input(s): "LIPASE", "AMYLASE" in the last 168 hours. No results for input(s): "AMMONIA" in the last 168 hours. CBC: Recent Labs  Lab 09/08/22 0650 09/09/22 0423 09/10/22 0326 09/11/22 0433  WBC 13.7* 13.3* 12.1* 10.5  NEUTROABS 12.6*  --   --   --   HGB 12.8* 13.1 13.9 14.1  HCT 37.5* 39.2 41.5 42.1  MCV 88.2 91.0 89.8 89.4  PLT 172 157 153 175   Cardiac Enzymes: No results for input(s): "CKTOTAL", "CKMB", "CKMBINDEX", "TROPONINI" in the last 168 hours. BNP: BNP (last 3 results) Recent Labs    09/08/22 2101  BNP 507.3*    ProBNP (last 3 results) No results for input(s): "PROBNP" in the last 8760 hours.  CBG: No results for input(s): "GLUCAP" in the last 168 hours.     Signed:  Oswald Hillock MD.  [facility] 09/12/2022, 11:29 AM

## 2022-09-12 NOTE — PMR Pre-admission (Signed)
PMR Admission Coordinator Pre-Admission Assessment  Patient: Perry Rosales is an 87 y.o., male MRN: [id number] DOB: [birth year] Height: 5\' 9"  (175.3 cm) Weight: 79.8 kg  Insurance Information HMO:     PPO:      PCP:      IPA:      80/20: yes     OTHER:  PRIMARY:  Medicare A and B       Policy#: [id number] Subscriber: Pt. Phone#: Verified online    Fax#:  Pre-Cert#:       Employer:  Benefits:  Phone #:      Name:  Eff. Date: Parts A  09/28/2002 and B effective 05/30/2011    Deduct: [id number]      Out of Pocket Max:  None      Life Max: N/A  CIR: 100%      SNF: 100 days Outpatient: 80%     Co-Pay: 20% Home Health: 100%      Co-Pay: none DME: 80%     Co-Pay: 20% Providers: patient's choice SECONDARY:       Policy#:      Phone#:   Development worker, community:       Phone#:   The Engineer, petroleum" for patients in Inpatient Rehabilitation Facilities with attached "Privacy Act [place] Records" was provided and verbally reviewed with: Patient and Family  Emergency Contact Information Contact Information     Name Relation Home Work Mobile   Niotaze Son   [phone]   INIYAN, NIXDORF [phone]         Current Medical History  Patient Admitting Diagnosis: L Hip Fx  History of Present Illness:  Perry Rosales is a 87 y.o. male with medical history significant of hypertension, GERD, BPH who lives with his demented wife, and  ambulates without any problem at home, wp  was brought to the emergency department at [facility]  09/08/22  after he was found on the floor.  Patient usually sleeps on the chair so that he can check his wife at night.  The night PTA, he got out of the chair and was going to the bathroom, he lost his balance and fell on his left side.  The light in the room was dim.  He landed on the  floor, immediately developed pain on the left hip,unable to get up.  No report of head injury.   On presentation the the emergency department, he  was hypertensive and in mild sinus tachycardia.  He was complaining of pain on the left hip.  X-ray of the hips showed acute subcapital left femoral neck fracture.  Orthopedics consulted, admitted with plan for ORIF .There was no report of fever, chills, chest pain, shortness of breath, cough, nausea, vomiting, hematochezia or melena.  Patient reported that he had chronic bilateral lower extremity edema. Lab work showed WBC of 13.7. He was transferred to [facility] 09/10/22 and orthopedics performed cemented left total hip arthroplasty (posterior approach.) Pt. Was also seen by cardiology during this admission due to new onset CHF. 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 Pt. Subsequently developed some AKI and diuretics were discontinued. While admitted Pt. Was seen by PT/OT and they recommended CIR to assist return to PLOF.     Patient's medical record from [facility]  has been reviewed by the rehabilitation admission coordinator and physician.  Past Medical History  Past Medical History:  Diagnosis Date   GERD (gastroesophageal reflux disease)    Heart murmur    Hypertension     Has the patient had major surgery during 100 days prior to admission? Yes  Family History   family history is not on file.  Current Medications  Current Facility-Administered Medications:    amitriptyline (ELAVIL) tablet 50 mg, 50 mg, Oral, QHS, Cockerham, Alicia M, PA-C, 50 mg at 09/11/22 2037   aspirin EC tablet 81 mg, 81 mg, Oral, BID, Prince Rome, PA-C, 81 mg at 09/12/22 [id number]   Chlorhexidine Gluconate Cloth 2 % PADS 6 each, 6 each, Topical, Daily, Prince Rome, PA-C, 6 each at 09/11/22 1153   dapagliflozin propanediol (FARXIGA) tablet 10 mg, 10 mg, Oral, Daily, Skeet Latch, MD, 10 mg at 09/12/22 [id number]   hydrALAZINE (APRESOLINE)  injection 10 mg, 10 mg, Intravenous, Q6H PRN, Nickola Major, Alicia M, PA-C   HYDROmorphone (DILAUDID) injection 0.5-1 mg, 0.5-1 mg, Intravenous, Q4H PRN, Nickola Major, Alicia M, PA-C, 1 mg at 09/12/22 0037   lip balm (CARMEX) ointment, , Topical, PRN, Prince Rome, PA-C, 75 Application at [id number]   melatonin tablet 3 mg, 3 mg, Oral, QHS PRN, Willaim Sheng, MD, 3 mg at 09/11/22 2042   methocarbamol (ROBAXIN) 500 mg in dextrose 5 % 50 mL IVPB, 500 mg, Intravenous, Q8H PRN, Prince Rome, PA-C, Last Rate: 110 mL/hr at 09/11/22 0408, 500 mg at 09/11/22 0408   methocarbamol (ROBAXIN) tablet 500 mg, 500 mg, Oral, Q8H PRN, Willaim Sheng, MD, 500 mg at 09/11/22 2037   metoprolol succinate (TOPROL-XL) 24 hr tablet 25 mg, 25 mg, Oral, Daily, Cockerham, Alicia M, PA-C, 25 mg at 09/12/22 [id number]   Oral care mouth rinse, 15 mL, Mouth Rinse, PRN, Nickola Major, Alicia M, PA-C   oxyCODONE (Oxy IR/ROXICODONE) immediate release tablet 5-10 mg, 5-10 mg, Oral, Q4H PRN, Dorise Bullion M, PA-C, 10 mg at 09/11/22 2036   pantoprazole (PROTONIX) EC tablet 40 mg, 40 mg, Oral, Daily, Dorise Bullion M, PA-C, 40 mg at 09/12/22 0808   phenol (CHLORASEPTIC) mouth spray 1 spray, 1 spray, Mouth/Throat, PRN, Dorise Bullion M, PA-C, 1 spray at 09/11/22 1343   polyethylene glycol (MIRALAX / GLYCOLAX) packet 17 g, 17 g, Oral, Daily, Dorise Bullion M, PA-C, 17 g at 09/12/22 [id number]   polyvinyl alcohol (LIQUIFILM TEARS) 1.4 % ophthalmic solution 1 drop, 1 drop, Both Eyes, PRN, Darrick Meigs, Marge Duncans, MD, 1 drop at 09/10/22 2347   senna-docusate (Senokot-S) tablet 1 tablet, 1 tablet, Oral, BID, Prince Rome, PA-C, 1 tablet at 09/12/22 0808  Patients Current Diet:  Diet Order             Diet regular Room service appropriate? Yes; Fluid consistency: Thin  Diet effective now                   Precautions / Restrictions Precautions Precautions: Fall Restrictions Weight Bearing Restrictions: Yes LLE  Weight Bearing: Weight bearing as tolerated   Has the patient had 2 or more falls or a fall with injury in the past year? Yes  Prior Activity Level Community (5-7x/wk): Pt. active in the community PTA  Prior Functional Level Self Care: Did the patient need help bathing, dressing, using the toilet or eating? Independent  Indoor Mobility: Did the patient need assistance with walking from room to room (with or without device)? Independent  Stairs: Did the patient need assistance with internal or external stairs (with  or without device)? Independent  Functional Cognition: Did the patient need help planning regular tasks such as shopping or remembering to take medications? Independent  Patient Information Are you of Hispanic, Latino/a,or Spanish origin?: A. No, not of Hispanic, Latino/a, or Spanish origin What is your race?: A. White Do you need or want an interpreter to communicate with a doctor or health care staff?: 0. No  Patient's Response To:  Health Literacy and Transportation Is the patient able to respond to health literacy and transportation needs?: Yes Health Literacy - How often do you need to have someone help you when you read instructions, pamphlets, or other written material from your doctor or pharmacy?: Never In the past 12 months, has lack of transportation kept you from medical appointments or from getting medications?: No In the past 12 months, has lack of transportation kept you from meetings, work, or from getting things needed for daily living?: No  Home Assistive Devices / Erick Devices/Equipment: Environmental consultant (specify type), Dentures (specify type), Eyeglasses Home Equipment: Conservation officer, nature (2 wheels), Rollator (4 wheels), BSC/3in1, Shower seat, Wheelchair - manual  Prior Device Use: Indicate devices/aids used by the patient prior to current illness, exacerbation or injury? None of the above  Current Functional Level Cognition  Overall Cognitive  Status: Within Functional Limits for tasks assessed Orientation Level: Oriented X4    Extremity Assessment (includes Sensation/Coordination)  Upper Extremity Assessment: Defer to OT evaluation  Lower Extremity Assessment: LLE deficits/detail LLE Deficits / Details: Guarded as expected post op. Limited movement due to pain    ADLs       Mobility  Overal bed mobility: Needs Assistance Bed Mobility: Supine to Sit Supine to sit: Mod assist, HOB elevated General bed mobility comments: Heavy mod assist for LLE support and trunk to rise to EOB, patient able to pull through therapist hand. Very guarded due to pain.    Transfers  Overall transfer level: Needs assistance Equipment used: Rolling walker (2 wheels) Transfers: Sit to/from Stand, Bed to chair/wheelchair/BSC Sit to Stand: Max assist, From elevated surface Bed to/from chair/wheelchair/BSC transfer type:: Step pivot Step pivot transfers: Mod assist General transfer comment: Max assist for boost to stand from elevated bed surface. Heavy posterior lean, minimal WB tolerated through LLE. Cues for technique. Once upright able to take small shuffled/pivot steps towards Rt to sit in recliner with modassist for balance and RW progression during turn, max VC for sequencing.    Ambulation / Gait / Stairs / Office manager / Balance Balance Overall balance assessment: Needs assistance, History of Falls Sitting-balance support: Single extremity supported, Feet supported Sitting balance-Leahy Scale: Poor Postural control: Posterior lean Standing balance support: Bilateral upper extremity supported, Reliant on assistive device for balance Standing balance-Leahy Scale: Poor    Special needs/care consideration Skin surgical incision   Previous Home Environment (from acute therapy documentation) Living Arrangements: Spouse/significant other Available Help at Discharge: Family, Available 24 hours/day Type of Home:  House Home Layout: One level Home Access: Stairs to enter Entrance Stairs-Rails: Right, Left Entrance Stairs-Number of Steps: 2 Bathroom Shower/Tub: Art gallery manager: Yes Home Care Services: No  Discharge Living Setting Plans for Discharge Living Setting: Patient's home Type of Home at Discharge: House Discharge Home Layout: One level Discharge Home Access: Stairs to enter Entrance Stairs-Rails: Right, Left, Can reach both Entrance Stairs-Number of Steps: 2 Discharge Bathroom Shower/Tub: Walk-in shower Discharge Bathroom Toilet: Handicapped height Discharge Bathroom Accessibility: Yes How Accessible: Accessible via  walker Does the patient have any problems obtaining your medications?: No  Social/Family/Support Systems Patient Roles: Other (Comment) Contact Information: Trinidad Curet Anticipated Caregiver: [phone] Anticipated Caregiver's Contact Information: Moving in with pt. and [facility], able to provide 24/7 min a Ability/Limitations of Caregiver: min A Caregiver Availability: 24/7 Discharge Plan Discussed with Primary Caregiver: Yes Is Caregiver In Agreement with Plan?: Yes Does Caregiver/Family have Issues with Lodging/Transportation while Pt is in Rehab?: No  Goals Patient/Family Goal for Rehab: PT/OT Min A Expected length of stay: 16-18 days Pt/Family Agrees to Admission and willing to participate: Yes Program Orientation Provided & Reviewed with Pt/Caregiver Including Roles  & Responsibilities: Yes  Decrease burden of Care through IP rehab admission: n/a  Possible need for SNF placement upon discharge: none   Patient Condition: I have reviewed medical records from [facility], spoken with CM, and patient and son. I met with patient at the bedside for inpatient rehabilitation assessment.  Patient will benefit from ongoing PT and OT, can actively participate in 3 hours of therapy a day 5 days of the week, and can make measurable gains  during the admission.  Patient will also benefit from the coordinated team approach during an Inpatient Acute Rehabilitation admission.  The patient will receive intensive therapy as well as Rehabilitation physician, nursing, social worker, and care management interventions.  Due to safety, skin/wound care, disease management, medication administration, pain management, and patient education the patient requires 24 hour a day rehabilitation nursing.  The patient is currently Mod-Max A  with mobility and basic ADLs.  Discharge setting and therapy post discharge at home with home health is anticipated.  Patient has agreed to participate in the Acute Inpatient Rehabilitation Program and will admit today.  Preadmission Screen Completed By:  Genella Mech, 09/12/2022 11:20 AM ______________________________________________________________________   Discussed status with Dr. Tressa Busman  on 09/12/22 at 1000 and received approval for admission today.  Admission Coordinator:  Genella Mech, CCC-SLP, time 1145/Date 09/12/22   Assessment/Plan: Diagnosis: Does the need for close, 24 hr/day Medical supervision in concert with the patient's rehab needs make it unreasonable for this patient to be served in a less intensive setting? Yes Co-Morbidities requiring supervision/potential complications: R hip fracture s/p ORIF, AECHF, AKI, HTN, GERD  Due to bladder management, bowel management, safety, skin/wound care, disease management, medication administration, pain management, and patient education, does the patient require 24 hr/day rehab nursing? Yes Does the patient require coordinated care of a physician, rehab nurse, PT, OT, and SLP to address physical and functional deficits in the context of the above medical diagnosis(es)? Yes Addressing deficits in the following areas: balance, endurance, locomotion, strength, transferring, bowel/bladder control, bathing, dressing, feeding, grooming, and toileting Can the patient  actively participate in an intensive therapy program of at least 3 hrs of therapy 5 days a week? Yes The potential for patient to make measurable gains while on inpatient rehab is excellent Anticipated functional outcomes upon discharge from inpatient rehab: min assist PT, min assist OT Estimated rehab length of stay to reach the above functional goals is: 16-18 days Anticipated discharge destination: Home 10. Overall Rehab/Functional Prognosis: good   MD Signature:  Gertie Gowda, DO 09/12/2022

## 2022-09-12 NOTE — H&P (Signed)
Physical Medicine and Rehabilitation Admission H&P    Chief Complaint  Patient presents with   Fall  : HPI: Perry Rosales is an 87 year old male with a past medical history of hypertension, GERD, and BPH who presented to the ED on 09/08/2022 after he lost balance getting out of his chair at nighttime, falling onto his left side.  On ED evaluation, significant findings showed acute subcapital left femoral neck fracture.  Orthopedics was consulted and performed left total hip replacement on 3/14.  Postop, he was limited by significant limited mobility, primarily due to pain as he is weightbearing as tolerated in the left lower extremity with no hip precautions.  He is to follow-up with Dr. Suzy Bouchard at [facility] will repeat 6 2 weeks postop for wound check.  Aquacel dressing is to remain intact until follow-up.  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  He developed AKI, with creatinine stabilizing at 1.6-1.7 off of IV Lasix.  He was evaluated by PT, OT and found to be appropriate for admission to inpatient rehab on 09/12/2022.  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  Patient's wife is severely debilitated, he is generally her primary caregiver.  He lives in a 1 level home with 3 steps to enter, with handrails on both sides.    Review of Systems  Constitutional:  Positive for weight loss. Negative for chills and fever.  Respiratory:  Negative for cough and hemoptysis.   Cardiovascular:  Negative for chest pain and palpitations.  Gastrointestinal:  Positive for constipation. Negative for nausea and vomiting.  Genitourinary:  Negative for dysuria.  Musculoskeletal:  Positive for falls.       + L leg pain, diffuse  Skin:  Negative for rash.  Neurological:  Positive for dizziness. Negative for  sensory change, focal weakness and headaches.  Psychiatric/Behavioral:  The patient does not have insomnia.    Past Medical History:  Diagnosis Date   GERD (gastroesophageal reflux disease)    Heart murmur    Hypertension    Past Surgical History:  Procedure Laterality Date   CATARACT EXTRACTION W/ INTRAOCULAR LENS  IMPLANT, BILATERAL     CHOLECYSTECTOMY     COLONOSCOPY     TOTAL HIP ARTHROPLASTY Left 09/10/2022   Procedure: TOTAL HIP REPLACEMENT;  Surgeon: Willaim Sheng, MD;  Location: [facility];  Service: Orthopedics;  Laterality: Left;   Family History  Problem Relation Age of Onset   Colon cancer Neg Hx    Esophageal cancer Neg Hx    Rectal cancer Neg Hx    Stomach cancer Neg Hx    Social History:  reports that he quit smoking about 2 years ago. His smoking use included cigarettes. He has a 30.00 pack-year smoking history. He has been exposed to tobacco smoke. He has never used smokeless tobacco. He reports that he does not drink alcohol and does not use drugs. Allergies: No Known Allergies Medications Prior to Admission  Medication Sig Dispense Refill   amitriptyline (ELAVIL) 50 MG tablet Take 50 mg by mouth at bedtime.     amLODipine (NORVASC) 5 MG tablet Take 5 mg by mouth daily.     aspirin EC 81 MG tablet Take 1 tablet (81 mg total) by mouth 2 (two) times daily. Swallow whole. 60 tablet 0   [START ON 09/13/2022] dapagliflozin  propanediol (FARXIGA) 10 MG TABS tablet Take 1 tablet (10 mg total) by mouth daily. 30 tablet    melatonin 3 MG TABS tablet Take 1 tablet (3 mg total) by mouth at bedtime as needed.  0   methocarbamol (ROBAXIN) 500 MG tablet Take 1 tablet (500 mg total) by mouth every 8 (eight) hours as needed for muscle spasms.     [START ON 09/13/2022] metoprolol succinate (TOPROL-XL) 25 MG 24 hr tablet Take 1 tablet (25 mg total) by mouth daily.     olmesartan (BENICAR) 20 MG tablet Take 20 mg by mouth daily.     omeprazole (PRILOSEC) 20 MG capsule Take 20 mg by  mouth daily.     [START ON 09/13/2022] polyethylene glycol (MIRALAX / GLYCOLAX) 17 g packet Take 17 g by mouth daily. 14 each 0   senna-docusate (SENOKOT-S) 8.6-50 MG tablet Take 1 tablet by mouth 2 (two) times daily.     tamsulosin (FLOMAX) 0.4 MG CAPS capsule Take 0.4 mg by mouth in the morning and at bedtime. (Patient not taking: Reported on 09/08/2022)     traMADol-acetaminophen (ULTRACET) 37.5-325 MG per tablet Take 1-2 tablets by mouth See admin instructions. 1 tablet in the morning, 1 tablet at lunch, and 2 tablet in the evening        Home: [place] expects to be discharged to:: Private residence Living Arrangements: Spouse/significant other Available Help at Discharge: Family, Available 24 hours/day Type of Home: House Home Access: Stairs to enter [employer] of Steps: 2 Entrance Stairs-Rails: Right, Left Home Layout: One level Bathroom Shower/Tub: Holiday representative Accessibility: Yes Home Equipment: Conservation officer, nature (2 wheels), Rollator (4 wheels), BSC/3in1, Shower seat, Wheelchair - manual   Functional History: Prior Function Prior Level of Function : Independent/Modified Independent, Driving, History of Falls (last six months) Mobility Comments: ind, helps wife, has house keepers assist with cleaning  Functional Status:  Mobility: Bed Mobility Overal bed mobility: Needs Assistance Bed Mobility: Supine to Sit Supine to sit: Mod assist, HOB elevated General bed mobility comments: Heavy mod assist for LLE support and trunk to rise to EOB, patient able to pull through therapist hand. Very guarded due to pain. Transfers Overall transfer level: Needs assistance Equipment used: Rolling walker (2 wheels) Transfers: Sit to/from Stand, Bed to chair/wheelchair/BSC Sit to Stand: Max assist, From elevated surface Bed to/from chair/wheelchair/BSC transfer type:: Step pivot Step pivot transfers: Mod assist General transfer comment: Max assist for  boost to stand from elevated bed surface. Heavy posterior lean, minimal WB tolerated through LLE. Cues for technique. Once upright able to take small shuffled/pivot steps towards Rt to sit in recliner with modassist for balance and RW progression during turn, max VC for sequencing.      ADL:    Cognition: Cognition Overall Cognitive Status: Within Functional Limits for tasks assessed Orientation Level: Oriented X4 Cognition Arousal/Alertness: Lethargic, Suspect due to medications Behavior During Therapy: WFL for tasks assessed/performed Overall Cognitive Status: Within Functional Limits for tasks assessed  Physical Exam: Blood pressure (!) 117/57, pulse 84, temperature (!) 97.5 F (36.4 C), temperature source Oral, resp. rate 18, height 5\' 9"  (1.753 m), weight 79.8 kg, SpO2 97 %. Constitutional: No apparent distress. Appropriate appearance for age.  HENT: No JVD. Neck Supple. Trachea midline. Atraumatic, normocephalic. Eyes: PERRLA. EOMI. Visual fields grossly intact.  Cardiovascular: RRR, no murmurs/rub/gallops. No Edema. Peripheral pulses 2+  Respiratory: CTAB. No rales, rhonchi, or wheezing. On 2 L nasal cannula Abdomen: + bowel sounds, normoactive. No distention  or tenderness.  GU: Not examined.  Skin: C/D/I. + Dry, unstageable ulcer with overlying eschar on the base of the left first MTP. MSK:      No apparent deformity.       Strength:                RUE: 5/5 SA, 5/5 EF, 5/5 EE, 5/5 WE, 5/5 FF, 5/5 FA                 LUE: 5/5 SA, 5/5 EF, 5/5 EE, 5/5 WE, 5/5 FF, 5/5 FA                 RLE: 3/5 HF, 5/5 KE, 5/5 DF, 5/5 EHL, 5/5 PF                 LLE:  2/5 HF, 2/5 KE, 5/5 DF, 5/5 EHL, 5/5 PF   Neurologic exam:  Cognition: AAO to person, place, time and event.  Language: Fluent, No substitutions or neoglisms. No dysarthria. Names 3/3 objects correctly.  Memory: Recalls 3/3 objects at 5 minutes. No apparent deficits  Insight: Good  insight into current condition.  Mood:  Pleasant affect, appropriate mood.  Sensation: Intact to light touch in BL UE and Les  Reflexes: 2+ in BL UE and LEs. Negative Hoffman's and babinski signs bilaterally.  CN: 2-12 grossly intact.     Results for orders placed or performed during the hospital encounter of 09/08/22 (from the past 48 hour(s))  CBC     Status: None   Collection Time: 09/11/22  4:33 AM  Result Value Ref Range   WBC 10.5 4.0 - 10.5 K/uL   RBC 4.71 4.22 - 5.81 MIL/uL   Hemoglobin 14.1 13.0 - 17.0 g/dL   HCT 42.1 39.0 - 52.0 %   MCV 89.4 80.0 - 100.0 fL   MCH 29.9 26.0 - 34.0 pg   MCHC 33.5 30.0 - 36.0 g/dL   RDW 12.1 11.5 - 15.5 %   Platelets 175 150 - 400 K/uL   nRBC 0.0 0.0 - 0.2 %    Comment: Performed at [facility], [place] [address], [place], [place] [id number]  Basic metabolic panel     Status: Abnormal   Collection Time: 09/11/22  4:33 AM  Result Value Ref Range   Sodium 137 135 - 145 mmol/L   Potassium 3.5 3.5 - 5.1 mmol/L   Chloride 95 (L) 98 - 111 mmol/L   CO2 27 22 - 32 mmol/L   Glucose, Bld 142 (H) 70 - 99 mg/dL    Comment: Glucose reference range applies only to samples taken after fasting for at least 8 hours.   BUN 46 (H) 8 - 23 mg/dL   Creatinine, Ser 1.66 (H) 0.61 - 1.24 mg/dL   Calcium 7.3 (L) 8.9 - 10.3 mg/dL   GFR, Estimated 40 (L) >60 mL/min    Comment: (NOTE) Calculated using the CKD-EPI Creatinine Equation (2021)    Anion gap 15 5 - 15    Comment: Performed at [facility] [address], [place], [place] [id number]  Basic metabolic panel     Status: Abnormal   Collection Time: 09/12/22  2:17 AM  Result Value Ref Range   Sodium 135 135 - 145 mmol/L   Potassium 4.0 3.5 - 5.1 mmol/L   Chloride 95 (L) 98 - 111 mmol/L   CO2 29 22 - 32 mmol/L   Glucose, Bld 147 (H) 70 - 99 mg/dL  Comment: Glucose reference range applies only to samples taken after fasting for at least 8 hours.   BUN 58 (H) 8 - 23 mg/dL   Creatinine, Ser 1.72 (H) 0.61 - 1.24 mg/dL   Calcium 6.9  (L) 8.9 - 10.3 mg/dL   GFR, Estimated 39 (L) >60 mL/min    Comment: (NOTE) Calculated using the CKD-EPI Creatinine Equation (2021)    Anion gap 11 5 - 15    Comment: Performed at [facility] [address] [place], [place] [address]   No results found.    Blood pressure (!) 117/57, pulse 84, temperature (!) 97.5 F (36.4 C), temperature source Oral, resp. rate 18, height 5\' 9"  (1.753 m), weight 79.8 kg, SpO2 97 %.  Medical Problem List and Plan: 1. Functional deficits secondary to left hip fracture status post total hip replacement on 3/14  -patient may shower; Aquacel dressing was from being in place for 2 weeks until Ortho follow-up  -ELOS/Goals: 16 to 18 days, min assist PT /OT goals   2.  Antithrombotics: -DVT/anticoagulation:  Pharmaceutical: Other (comment) aspirin 81 mg BID  -antiplatelet therapy: ASA as above 3. Pain Management: Scheduled Tylenol 1000 mg 3 times daily, as needed oxycodone 5 to 10 mg, Robaxin as needed 4. Mood/Behavior/Sleep: Elavil 25 mg nightly  -antipsychotic agents: None 5. Neuropsych/cognition: This patient is capable of making decisions on his own behalf. 6. Skin/Wound Care: Aquacel dressing to remain over surgical site for 2 weeks postop.  Otherwise, routine wound management.  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 7. Fluids/Electrolytes/Nutrition: Regular diet with thins, heart healthy.  Monitor admission labs. 8.  Acute on chronic systolic and diastolic CHF -Started on Farxiga, metoprolol inpatient -Weaned off of Lasix due to AKI -Daily weights Filed Weights   09/12/22 1525  Weight: 75.3 kg   9.  AKI on CKD.  Creatinine stable at 1.6-1.7.  Admission labs pending. 10.  Hypertension.  Metoprolol as above.  Monitor.  Home amlodipine currently held. 11.  BPH.  On Flomax 0.4 mg daily. 12.  GERD.  Continue PPI.     Gertie Gowda, DO 09/12/2022

## 2022-09-12 NOTE — Plan of Care (Signed)
  Problem: Consults Goal: [facility] GENERAL PATIENT EDUCATION Description: See Patient Education module for education specifics. 09/12/2022 1612 by Jilda Roche, RN Outcome: Progressing 09/12/2022 1612 by Jilda Roche, RN Outcome: Progressing Goal: Skin Care Protocol Initiated - if Braden Score 18 or less Description: If consults are not indicated, leave blank or document N/A 09/12/2022 1612 by Jilda Roche, RN Outcome: Progressing 09/12/2022 1612 by Jilda Roche, RN Outcome: Progressing Goal: Nutrition Consult-if indicated 09/12/2022 1612 by Jilda Roche, RN Outcome: Progressing 09/12/2022 1612 by Jilda Roche, RN Outcome: Progressing Goal: Diabetes Guidelines if Diabetic/Glucose > 140 Description: If diabetic or lab glucose is > 140 mg/dl - Initiate Diabetes/Hyperglycemia Guidelines & Document Interventions  09/12/2022 1612 by Jilda Roche, RN Outcome: Progressing 09/12/2022 1612 by Jilda Roche, RN Outcome: Progressing

## 2022-09-12 NOTE — Congregational Nurse Program (Signed)
Inpatient Rehab Admissions Coordinator:  ° °Patient was screened for CIR candidacy by Louie Flenner, MS, CCC-SLP. At this time, Pt. Appears to be a a potential candidate for CIR. I will place  order for rehab consult per protocol for full assessment. Please contact me any with questions. ° °Teyana Pierron, MS, CCC-SLP °Rehab Admissions Coordinator  °[phone] (celll) °[phone] (office) ° °

## 2022-09-12 NOTE — Progress Notes (Signed)
     Subjective: Patient reports pain as moderate at rest, sevre with movement. Slept well overnight. Amenable to discharge to SNF. Denies CP, SOB, Nausea, Vomiting, or abdominal pain. No BM yet.  Objective:   VITALS:   Vitals:   09/11/22 1339 09/11/22 2032 09/11/22 2347 09/12/22 0809  BP: 113/71 138/72  131/64  Pulse: 94 97  84  Resp:  18    Temp: 98.2 F (36.8 C) 99.4 F (37.4 C)    TempSrc: Oral Oral    SpO2: 99% (!) 88% 93%   Weight:      Height:        Sensation intact distally Intact pulses distally Dorsiflexion/Plantar flexion intact Incision: dressing C/D/I Compartment soft   Lab Results  Component Value Date   WBC 10.5 09/11/2022   HGB 14.1 09/11/2022   HCT 42.1 09/11/2022   MCV 89.4 09/11/2022   PLT 175 09/11/2022   BMET    Component Value Date/Time   NA 135 09/12/2022 0217   K 4.0 09/12/2022 0217   CL 95 (L) 09/12/2022 0217   CO2 29 09/12/2022 0217   GLUCOSE 147 (H) 09/12/2022 0217   BUN 58 (H) 09/12/2022 0217   CREATININE 1.72 (H) 09/12/2022 0217   CALCIUM 6.9 (L) 09/12/2022 0217   GFRNONAA 39 (L) 09/12/2022 0217      Xray: THA components in good position no adverse features.  Assessment/Plan: 2 Days Post-Op   Principal Problem:   Closed left hip fracture (HCC) Active Problems:   Benign prostatic hyperplasia with lower urinary tract symptoms   Essential hypertension   Gastro-esophageal reflux disease without esophagitis   Leg edema   Acute systolic heart failure (HCC)  S/p L THA 3/14  Post op recs: WB: WBAT LLE, No formal hip precautions Abx: ancef Imaging: PACU pelvis Xray Dressing: Aquacell, keep intact until follow up DVT prophylaxis: Aspirin 81BID starting POD1 Follow up: 2 weeks after surgery for a wound check with Dr. Zachery Dakins at [facility].  Dispo: PT eval recommending SNF due to significant limited mobility, TOC on board. Discussed with family at bedside.   Perry Rosales 09/12/2022, 8:14 AM

## 2022-09-13 DIAGNOSIS — S72002S Fracture of unspecified part of neck of left femur, sequela: Secondary | ICD-10-CM | POA: Diagnosis not present

## 2022-09-13 LAB — CBC WITH DIFFERENTIAL/PLATELET
Abs Immature Granulocytes: 0.09 10*3/uL — ABNORMAL HIGH (ref 0.00–0.07)
Basophils Absolute: 0.1 10*3/uL (ref 0.0–0.1)
Basophils Relative: 1 %
Eosinophils Absolute: 0.5 10*3/uL (ref 0.0–0.5)
Eosinophils Relative: 5 %
HCT: 36.7 % — ABNORMAL LOW (ref 39.0–52.0)
Hemoglobin: 12.5 g/dL — ABNORMAL LOW (ref 13.0–17.0)
Immature Granulocytes: 1 %
Lymphocytes Relative: 5 %
Lymphs Abs: 0.5 10*3/uL — ABNORMAL LOW (ref 0.7–4.0)
MCH: 30.3 pg (ref 26.0–34.0)
MCHC: 34.1 g/dL (ref 30.0–36.0)
MCV: 89.1 fL (ref 80.0–100.0)
Monocytes Absolute: 0.8 10*3/uL (ref 0.1–1.0)
Monocytes Relative: 8 %
Neutro Abs: 8.6 10*3/uL — ABNORMAL HIGH (ref 1.7–7.7)
Neutrophils Relative %: 80 %
Platelets: 143 10*3/uL — ABNORMAL LOW (ref 150–400)
RBC: 4.12 MIL/uL — ABNORMAL LOW (ref 4.22–5.81)
RDW: 12 % (ref 11.5–15.5)
WBC: 10.5 10*3/uL (ref 4.0–10.5)
nRBC: 0 % (ref 0.0–0.2)

## 2022-09-13 LAB — COMPREHENSIVE METABOLIC PANEL
ALT: 17 U/L (ref 0–44)
AST: 29 U/L (ref 15–41)
Albumin: 2.9 g/dL — ABNORMAL LOW (ref 3.5–5.0)
Alkaline Phosphatase: 58 U/L (ref 38–126)
Anion gap: 13 (ref 5–15)
BUN: 57 mg/dL — ABNORMAL HIGH (ref 8–23)
CO2: 27 mmol/L (ref 22–32)
Calcium: 7.4 mg/dL — ABNORMAL LOW (ref 8.9–10.3)
Chloride: 97 mmol/L — ABNORMAL LOW (ref 98–111)
Creatinine, Ser: 1.45 mg/dL — ABNORMAL HIGH (ref 0.61–1.24)
GFR, Estimated: 48 mL/min — ABNORMAL LOW (ref >60)
Glucose, Bld: 128 mg/dL — ABNORMAL HIGH (ref 70–99)
Potassium: 3.5 mmol/L (ref 3.5–5.1)
Sodium: 137 mmol/L (ref 135–145)
Total Bilirubin: 1 mg/dL (ref 0.3–1.2)
Total Protein: 6 g/dL — ABNORMAL LOW (ref 6.5–8.1)

## 2022-09-13 MED ORDER — TRAZODONE HCL 50 MG PO TABS
25.0000 mg | ORAL_TABLET | Freq: Every day | ORAL | Status: DC
  Administered 2022-09-13 – 2022-09-17 (×5): 25 mg via ORAL
  Filled 2022-09-13 (×5): qty 1

## 2022-09-13 MED ORDER — LIDOCAINE VISCOUS HCL 2 % MT SOLN
15.0000 mL | Freq: Three times a day (TID) | OROMUCOSAL | Status: DC | PRN
  Administered 2022-09-13 – 2022-09-16 (×9): 15 mL via OROMUCOSAL
  Filled 2022-09-13 (×11): qty 15

## 2022-09-13 MED ORDER — GABAPENTIN 100 MG PO CAPS: 100.0000 mg | ORAL_CAPSULE | Freq: Two times a day (BID) | ORAL | Status: DC | PRN

## 2022-09-13 MED ORDER — GABAPENTIN 100 MG PO CAPS
100.0000 mg | ORAL_CAPSULE | Freq: Two times a day (BID) | ORAL | Status: DC
  Administered 2022-09-13 – 2022-09-18 (×10): 100 mg via ORAL
  Filled 2022-09-13 (×10): qty 1

## 2022-09-13 MED ORDER — POLYETHYLENE GLYCOL 3350 17 G PO PACK
17.0000 g | PACK | Freq: Two times a day (BID) | ORAL | Status: DC
  Administered 2022-09-14 – 2022-09-16 (×4): 17 g via ORAL
  Filled 2022-09-13 (×6): qty 1

## 2022-09-13 NOTE — Evaluation (Signed)
Physical Therapy Assessment and Plan  Patient Details  Name: Perry Rosales MRN: [id number] Date of Birth: January 23, 1936  PT Diagnosis: Abnormality of gait, Difficulty walking, Muscle weakness, and Pain in joint Rehab Potential: Good ELOS: 14-17 days   Today's Date: 09/13/2022 PT Individual Time: 0800-0915 PT Individual Time Calculation (min): 75 min    Hospital Problem: Principal Problem:   Fracture of left hip ([place])   Past Medical History:  Past Medical History:  Diagnosis Date   GERD (gastroesophageal reflux disease)    Heart murmur    Hypertension    Past Surgical History:  Past Surgical History:  Procedure Laterality Date   CATARACT EXTRACTION W/ INTRAOCULAR LENS  IMPLANT, BILATERAL     CHOLECYSTECTOMY     COLONOSCOPY     TOTAL HIP ARTHROPLASTY Left 09/10/2022   Procedure: TOTAL HIP REPLACEMENT;  Surgeon: Willaim Sheng, MD;  Location: [place];  Service: Orthopedics;  Laterality: Left;    Assessment & Plan Clinical Impression: Patient is an 87 year old male with a past medical history of hypertension, GERD, and BPH who presented to the ED on 09/08/2022 after he lost balance getting out of his chair at nighttime, falling onto his left side.  On ED evaluation, significant findings showed acute subcapital left femoral neck fracture.  Orthopedics was consulted and performed left total hip replacement on 3/14.  Postop, he was limited by significant limited mobility, primarily due to pain as he is weightbearing as tolerated in the left lower extremity with no hip precautions.  He is to follow-up with Dr. Suzy Bouchard at [facility] will repeat 6 2 weeks postop for wound check.  Aquacel dressing is to remain intact until follow-up.   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  He developed AKI, with creatinine  stabilizing at 1.6-1.7 off of IV Lasix.  He was evaluated by PT, OT and found to be appropriate for admission to inpatient rehab on 09/12/2022.  Patient currently requires max with mobility secondary to muscle weakness.  Prior to hospitalization, patient was independent  with mobility and lived with Spouse, Son in a House home.  Home access is 2Stairs to enter.  Patient will benefit from skilled PT intervention to maximize safe functional mobility, minimize fall risk, and decrease caregiver burden for planned discharge home with 24 hour supervision.  Anticipate patient will benefit from follow up OP at discharge.  PT - End of Session Activity Tolerance: Tolerates 30+ min activity with multiple rests Endurance Deficit: Yes Endurance Deficit Description: able to perform ADL task without O2 keeping sats >90s PT Assessment Rehab Potential (ACUTE/IP ONLY): Good PT Barriers to Discharge: New oxygen;Weight bearing restrictions PT Patient demonstrates impairments in the following area(s): Balance;Endurance;Motor;Pain;Safety PT Transfers Functional Problem(s): Bed Mobility;Bed to Chair;Car PT Locomotion Functional Problem(s): Ambulation;Stairs PT Plan PT Intensity: Minimum of 1-2 x/day ,45 to 90 minutes PT Frequency: 5 out of 7 days PT Duration Estimated Length of Stay: 14-17 days PT Treatment/Interventions: Ambulation/gait training;Balance/vestibular training;Discharge planning;DME/adaptive equipment instruction;Functional mobility training;Patient/family education;Stair training;Therapeutic Activities;Therapeutic Exercise;UE/LE Strength taining/ROM;UE/LE Coordination activities PT Transfers Anticipated Outcome(s): ModI PT Locomotion Anticipated Outcome(s): supervision PT Recommendation Follow Up Recommendations: Outpatient PT Patient destination: Home Equipment Recommended: Rolling walker with 5" wheels   PT Evaluation Precautions/Restrictions Precautions Precautions:  Fall Restrictions Weight Bearing Restrictions: No LLE Weight Bearing: Weight bearing as tolerated General Chart Reviewed: Yes Vital Signs Pain Pain  Assessment Pain Scale: 0-10 Pain Score: 4  Pain Type: Acute pain;Surgical pain Pain Location: Hip Pain Orientation: Left Pain Descriptors / Indicators: Aching Pain Onset: On-going Multiple Pain Sites: No Pain Interference Pain Interference Pain Effect on Sleep: 3. Frequently Pain Interference with Therapy Activities: 3. Frequently Pain Interference with Day-to-Day Activities: 3. Frequently Home Living/Prior Functioning Home Living Available Help at Discharge: Family;Available 24 hours/day Type of Home: House Home Access: Stairs to enter [employer] of Steps: 2 Entrance Stairs-Rails: Right;Left Home Layout: One level Bathroom Shower/Tub: Holiday representative Accessibility: Yes  Lives With: Spouse;Son Vision/Perception  Vision - History Ability to See in Adequate Light: 0 Adequate Perception Perception: Within Functional Limits Praxis Praxis: Intact  Cognition Overall Cognitive Status: Within Functional Limits for tasks assessed Arousal/Alertness: Awake/alert Orientation Level: Oriented X4 Attention: Selective;Sustained Sustained Attention: Appears intact Selective Attention: Appears intact Memory: Appears intact Awareness: Appears intact Problem Solving: Appears intact Safety/Judgment: Appears intact Sensation Sensation Light Touch: Appears Intact Hot/Cold: Appears Intact Proprioception: Appears Intact Coordination Gross Motor Movements are Fluid and Coordinated: No Fine Motor Movements are Fluid and Coordinated: Yes Coordination and Movement Description: limited by pain in and weakness in left Le Motor  Motor Motor: Within Functional Limits Motor - Skilled Clinical Observations: generalized weakness and acute pain   Trunk/Postural Assessment  Cervical Assessment Cervical Assessment: Within  Functional Limits Thoracic Assessment Thoracic Assessment:  (rounded shoulders) Lumbar Assessment Lumbar Assessment:  (posterior pelvic tilt) Postural Control Postural Control: Within Functional Limits  Balance Balance Balance Assessed: Yes Dynamic Sitting Balance Dynamic Sitting - Balance Support: During functional activity Dynamic Sitting - Level of Assistance: 5: Stand by assistance Dynamic Sitting - Balance Activities: Reaching for objects Static Standing Balance Static Standing - Balance Support: Bilateral upper extremity supported Static Standing - Level of Assistance: 5: Stand by assistance;4: Min assist Extremity Assessment  RUE Assessment RUE Assessment: Within Functional Limits LUE Assessment LUE Assessment: Within Functional Limits RLE Assessment RLE Assessment: Within Functional Limits LLE Assessment LLE Assessment: Exceptions to [facility] General Strength Comments: gross 3/5, limited by pain  Care Tool Care Tool Bed Mobility Roll left and right activity   Roll left and right assist level: Maximal Assistance - Patient 25 - 49%    Sit to lying activity   Sit to lying assist level: Maximal Assistance - Patient 25 - 49%    Lying to sitting on side of bed activity   Lying to sitting on side of bed assist level: the ability to move from lying on the back to sitting on the side of the bed with no back support.: Maximal Assistance - Patient 25 - 49%     Care Tool Transfers Sit to stand transfer   Sit to stand assist level: Moderate Assistance - Patient 50 - 74%    Chair/bed transfer   Chair/bed transfer assist level: Moderate Assistance - Patient 50 - 74%     Toilet transfer   Assist Level: Moderate Assistance - Patient 50 - 74%    Car transfer   Car transfer assist level: Total Assistance - Patient < 25%;Maximal Assistance - Patient 25 - 49%      Care Tool Locomotion Ambulation   Assist level: Moderate Assistance - Patient 50 - 74% Assistive device:  Walker-rolling    Walk 10 feet activity Walk 10 feet activity did not occur: Safety/medical concerns (2/2 pain)       Walk 50 feet with 2 turns activity Walk 50 feet with 2 turns activity did not occur: Safety/medical concerns (  2/2 pain)      Walk 150 feet activity Walk 150 feet activity did not occur: Safety/medical concerns (2/2 pain)      Walk 10 feet on uneven surfaces activity Walk 10 feet on uneven surfaces activity did not occur: Safety/medical concerns (2/2 pain)      Stairs Stair activity did not occur: Safety/medical concerns (2/2 pain)        Walk up/down 1 step activity Walk up/down 1 step or curb (drop down) activity did not occur: Safety/medical concerns (2/2 pain)      Walk up/down 4 steps activity Walk up/down 4 steps activity did not occur: Safety/medical concerns (2/2 pain)      Walk up/down 12 steps activity Walk up/down 12 steps activity did not occur: Safety/medical concerns (2/2 pain)      Pick up small objects from floor Pick up small object from the floor (from standing position) activity did not occur: Safety/medical concerns (2/2 pain)      Wheelchair Is the patient using a wheelchair?: No   Wheelchair activity did not occur: N/A      Wheel 50 feet with 2 turns activity Wheelchair 50 feet with 2 turns activity did not occur: N/A    Wheel 150 feet activity Wheelchair 150 feet activity did not occur: N/A      Refer to Care Plan for Long Term Goals  SHORT TERM GOAL WEEK 1 PT Short Term Goal 1 (Week 1): Pt will transfer bed<>chair with MinA + RW PT Short Term Goal 2 (Week 1): Pt will complete bed mobility with MinA PT Short Term Goal 3 (Week 1): Pt will amb 28ft with MinA + RW PT Short Term Goal 4 (Week 1): Pt initiate stair training  Recommendations for other services: None   Skilled Therapeutic Intervention Mobility Bed Mobility Bed Mobility: Rolling Right;Rolling Left Rolling Right: Maximal Assistance - Patient 25-49% Rolling Left:  Maximal Assistance - Patient 25-49% Transfers Transfers: Sit to Stand;Stand Pivot Transfers Sit to Stand: Minimal Assistance - Patient > 75% Stand Pivot Transfers: Moderate Assistance - Patient 50 - 74% Stand Pivot Transfer Details: Verbal cues for sequencing;Verbal cues for technique;Verbal cues for safe use of DME/AE Transfer (Assistive device): Rolling walker (STEDY) Locomotion  Gait Ambulation: Yes Gait Assistance: Minimal Assistance - Patient > 75% Gait Distance (Feet): 6 Feet Assistive device: Rolling walker Gait Assistance Details: pain, decreased step length Gait Gait: No Stairs / Additional Locomotion Stairs: No Wheelchair Mobility Wheelchair Mobility: No  Session Note: Chart reviewed and pt agreeable to therapy. Pt received semi-reclined on 2L O2 via [place] in bed with 4/10 c/o pain at surgical site. Also of note, pt just received pn medication. Session focused on evaluation, functional transfers, and ambulation to promote safe home access. Pt initiated session with transfer to EOB using MaxA + bed features. Pt then completed sit to stand in STEDY for safety with MaxA. Pt transferred to [facility]. One finished, pt required totalA for hygiene. Pt then completed evaluation as described above. Pt also completed exercises of sit to stand progressing to [place] + RW with VC, standing marching with MinA for balance, amb of 37ft with MinA + RW, and SPT with ModA + RW. Session education emphasized hip recautions. At end of session, pt was left seated in [facility] with alarm engaged, nurse call bell and all needs in reach.    Discharge Criteria: Patient will be discharged from PT if patient refuses treatment 3 consecutive times without medical reason, if treatment goals not met, if  there is a change in medical status, if patient makes no progress towards goals or if patient is discharged from hospital.  The above assessment, treatment plan, treatment alternatives and goals were discussed and mutually agreed  upon: by patient  Marquette Old 09/13/2022, 12:10 PM

## 2022-09-13 NOTE — Progress Notes (Signed)
Sleepy this morning. Suggested to patient to not take trazodone tonight. "I couldn't have slept without it." Perry Rosales A

## 2022-09-13 NOTE — Progress Notes (Signed)
Restless first part of shift. Complained of burning pain to right thigh when coughing or repositioning.At 1955 requested pain meds, scheduled tylenol and PRN robaxin given. At 2114, scheduled elavil given and PRN melatonin, per patient's request. At 2230, called to nurse's station asking for med to help him sleep, PRN trazodone 50mg 's given. Patient has rested quietly since 2330. PVR 26ml. ? Last BM, patient reports " a couple of days ago." Bilateral heels elevated off bed. Unable to turn because of pain. Only ate a few bites of supper, because of burning in mouth. PRN lidocaine mouth solution given on previous shift. Patrici Ranks A

## 2022-09-13 NOTE — Progress Notes (Signed)
PROGRESS NOTE   Subjective/Complaints:  No acute complaints.  No events overnight.  Patient states that he had some difficulty sleeping until he was given as needed trazodone, then slept much better.  Asks for standing regimen.  Endorses some discomfort and distressed with bearing weight through his left leg, also endorses ongoing issues with intermittent burning pain on his right upper thigh.  Initially states that this appeared postop, but later recalls that it was occurring prior to his fall but not as severe.  Has no history of back, discs issues.  Discussed that due to the age this is likely due to nerve root impingement.    ROS: + Right thigh pain  -ongoing , insomnia  -improved , constipation  -ongoing .denies fevers, chills, N/V, abdominal pain, diarrhea, SOB, cough, chest pain, new weakness or paraesthesias.    Objective:   No results found. Recent Labs    09/11/22 0433 09/13/22 0552  WBC 10.5 10.5  HGB 14.1 12.5*  HCT 42.1 36.7*  PLT 175 143*   Recent Labs    09/12/22 1627 09/13/22 0552  NA 139 137  K 3.6 3.5  CL 97* 97*  CO2 29 27  GLUCOSE 132* 128*  BUN 52* 57*  CREATININE 1.43* 1.45*  CALCIUM 7.3* 7.4*    Intake/Output Summary (Last 24 hours) at 09/13/2022 1525 Last data filed at 09/13/2022 1300 Gross per 24 hour  Intake 717 ml  Output 600 ml  Net 117 ml        Physical Exam: Vital Signs Blood pressure (!) 101/57, pulse 90, temperature 98.2 F (36.8 C), resp. rate 16, height 5\' 9"  (1.753 m), weight 75 kg, SpO2 94 %. Constitutional: No apparent distress. Appropriate appearance for age.  HENT: No JVD. Neck Supple. Trachea midline. Atraumatic, normocephalic. Eyes: PERRLA. EOMI. Visual fields grossly intact.  Cardiovascular: RRR, no murmurs/rub/gallops. No Edema. Peripheral pulses 2+  Respiratory: CTAB. No rales, rhonchi, or wheezing. On 1 L nasal cannula Abdomen: + bowel sounds, normoactive. No  distention or tenderness.  GU: Not examined.  Skin: C/D/I. + Dry, unstageable ulcer with overlying eschar on the base of the left first MTP. - not examined today + L hip aquacell dressing clean; mild surrounding edema  MSK:      No apparent deformity.       Strength:                RUE: 5/5 SA, 5/5 EF, 5/5 EE, 5/5 WE, 5/5 FF, 5/5 FA                 LUE: 5/5 SA, 5/5 EF, 5/5 EE, 5/5 WE, 5/5 FF, 5/5 FA                 RLE: 3/5 HF, 5/5 KE, 5/5 DF, 5/5 EHL, 5/5 PF                 LLE:  2/5 HF, 2/5 KE, 5/5 DF, 5/5 EHL, 5/5 PF    Neurologic exam:  Cognition: AAO to person, place, time and event.  Language: Fluent, No substitutions or neoglisms. No dysarthria. Names 3/3 objects correctly.  Memory: Recalls 3/3 objects at 5 minutes.  No apparent deficits  Insight: Good  insight into current condition.  Mood: Pleasant affect, appropriate mood.  Sensation: Intact to light touch in BL UE and Les  Reflexes: 2+ in BL UE and LEs. Negative Hoffman's and babinski signs bilaterally.  CN: 2-12 grossly intact.    Assessment/Plan: 1. Functional deficits which require 3+ hours per day of interdisciplinary therapy in a comprehensive inpatient rehab setting. Physiatrist is providing close team supervision and 24 hour management of active medical problems listed below. Physiatrist and rehab team continue to assess barriers to discharge/monitor patient progress toward functional and medical goals  Care Tool:  Bathing    Body parts bathed by patient: Right arm, Left arm, Chest, Abdomen, Right upper leg, Left upper leg, Right lower leg, Face   Body parts bathed by helper: Left lower leg, Front perineal area, Buttocks     Bathing assist Assist Level: Moderate Assistance - Patient 50 - 74%     Upper Body Dressing/Undressing Upper body dressing   What is the patient wearing?: Pull over shirt    Upper body assist Assist Level: Set up assist    Lower Body Dressing/Undressing Lower body dressing       What is the patient wearing?: Underwear/pull up, Pants     Lower body assist Assist for lower body dressing: Total Assistance - Patient < 25%     Toileting Toileting    Toileting assist Assist for toileting: Maximal Assistance - Patient 25 - 49%     Transfers Chair/bed transfer  Transfers assist     Chair/bed transfer assist level: Moderate Assistance - Patient 50 - 74%     Locomotion Ambulation   Ambulation assist      Assist level: Moderate Assistance - Patient 50 - 74% Assistive device: Walker-rolling     Walk 10 feet activity   Assist  Walk 10 feet activity did not occur: Safety/medical concerns (2/2 pain)        Walk 50 feet activity   Assist Walk 50 feet with 2 turns activity did not occur: Safety/medical concerns (2/2 pain)         Walk 150 feet activity   Assist Walk 150 feet activity did not occur: Safety/medical concerns (2/2 pain)         Walk 10 feet on uneven surface  activity   Assist Walk 10 feet on uneven surfaces activity did not occur: Safety/medical concerns (2/2 pain)         Wheelchair     Assist Is the patient using a wheelchair?: No   Wheelchair activity did not occur: N/A         Wheelchair 50 feet with 2 turns activity    Assist    Wheelchair 50 feet with 2 turns activity did not occur: N/A       Wheelchair 150 feet activity     Assist  Wheelchair 150 feet activity did not occur: N/A       Blood pressure (!) 101/57, pulse 90, temperature 98.2 F (36.8 C), resp. rate 16, height 5\' 9"  (1.753 m), weight 75 kg, SpO2 94 %.   Medical Problem List and Plan: 1. Functional deficits secondary to left hip fracture status post total hip replacement on 3/14             -patient may shower; Aquacel dressing was from being in place for 2 weeks until Ortho follow-up              -ELOS/Goals: 16 to 18 days,  min assist PT /OT goals  - Wean oxygen as able, not home device     2.   Antithrombotics: -DVT/anticoagulation:  Pharmaceutical: Other (comment) aspirin 81 mg BID             -antiplatelet therapy: ASA as above  3. Pain Management: Scheduled Tylenol 1000 mg 3 times daily, as needed oxycodone 5 to 10 mg, Robaxin as needed  -Gabapentin 100 mg twice daily added for right thigh burning pain 4. Mood/Behavior/Sleep: Elavil 25 mg nightly             -antipsychotic agents: None   - 3/16: Much improved with as needed trazodone, assess for standing regimen, scheduled 25 mg nightly at 2000  5. Neuropsych/cognition: This patient is capable of making decisions on his own behalf. 6. Skin/Wound Care: Aquacel dressing to remain over surgical site for 2 weeks postop.  Otherwise, routine wound management.  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  7. Fluids/Electrolytes/Nutrition: Regular diet with thins, heart healthy.  Monitor admission labs. -Stable  8.  Acute on chronic systolic and diastolic CHF -Started on Farxiga, metoprolol inpatient -Weaned off of Lasix due to AKI -Daily weights - stable Filed Weights   09/12/22 1525 09/13/22 0604  Weight: 75.3 kg 75 kg    9.  AKI on CKD.  Creatinine stable at 1.6-1.7.  Admission labs pending. - Admission BUN, creatinine stable.  10.  Hypertension.  Metoprolol as above.  Monitor.  Home amlodipine currently held. -Mildly low diastolic, systolic stable, monitor    09/13/2022    1:14 PM 09/13/2022    6:04 AM 09/12/2022    7:51 PM  Vitals with BMI  Weight  165 lbs 6 oz   BMI  [id number]   Systolic [id number]  Diastolic 57 54 56  Pulse 90 82 93    11.  BPH.  On Flomax 0.4 mg daily. -Bladder scans low  12.  GERD.  Continue PPI.  13.  Constipation.  Last bowel movement prior to admission. -Senokot-S, 1 tablet twice daily and MiraLAX twice daily    LOS: 1 days A FACE TO FACE EVALUATION WAS PERFORMED  Gertie Gowda 09/13/2022, 3:25 PM

## 2022-09-13 NOTE — Progress Notes (Addendum)
     Subjective:  Patient reports pain as moderate at rest, worse with movement.  Still overall scared to move hip.  Slept well last night after pain meds.  Reports continued guarding/favoring of hip/leg.  No reports of chest pain, shortness of breath, N/V.  No other complaints at this time.  PT at bedside.  Objective:   VITALS:   Vitals:   09/12/22 1525 09/12/22 1951 09/13/22 0604  BP: (!) 141/72 (!) 108/56 (!) 137/54  Pulse: 85 93 82  Resp: 17 15 15   Temp: 98.2 F (36.8 C) 97.6 F (36.4 C) 98.6 F (37 C)  TempSrc: Oral Oral Oral  SpO2: 98% 93% 94%  Weight: 75.3 kg  75 kg  Height: 5\' 9"  (1.753 m)      Sensation intact distally Intact pulses distally Dorsiflexion/Plantar flexion intact Incision: dressing C/D/I Compartment soft  Lab Results  Component Value Date   WBC 10.5 09/13/2022   HGB 12.5 (L) 09/13/2022   HCT 36.7 (L) 09/13/2022   MCV 89.1 09/13/2022   PLT 143 (L) 09/13/2022   BMET    Component Value Date/Time   NA 137 09/13/2022 0552   K 3.5 09/13/2022 0552   CL 97 (L) 09/13/2022 0552   CO2 27 09/13/2022 0552   GLUCOSE 128 (H) 09/13/2022 0552   BUN 57 (H) 09/13/2022 0552   CREATININE 1.45 (H) 09/13/2022 0552   CALCIUM 7.4 (L) 09/13/2022 0552   GFRNONAA 48 (L) 09/13/2022 0552    Xray: THA components in good position no adverse features.   Assessment/Plan:     Principal Problem:   Fracture of left hip ([place])  S/p L THA 3/14   - Hgb 12.5 this morning, was 14.1 on 3/15  Post op recs: WB: WBAT LLE, No formal hip precautions Abx: ancef Imaging: PACU pelvis Xray Dressing: Aquacell, keep intact until follow up DVT prophylaxis: Aspirin 81BID starting POD1 Follow up: 2 weeks after surgery for a wound check with Dr. Zachery Dakins at [facility]  Address: [address] [place], [place], [place] [address]  Office Phone: [phone]   [place] [id number], 8:06 AM   Charlies Constable, MD  Contact information:   [phone]  7am-5pm epic message Dr. Zachery Dakins, or call office for patient follow up: [phone] After hours and holidays please check [email or web address] for group call information for [facility]

## 2022-09-13 NOTE — Evaluation (Signed)
Occupational Therapy Assessment and Plan  Patient Details  Name: Perry Rosales MRN: [id number] Date of Birth: May 15, 1936  OT Diagnosis: acute pain and muscle weakness (generalized) Rehab Potential: Rehab Potential (ACUTE ONLY): Good ELOS: ~14 days   Today's Date: 09/13/2022 OT Individual Time: 1100-1200 OT Individual Time Calculation (min): 60 min     Hospital Problem: Principal Problem:   Fracture of left hip ([place])   Past Medical History:  Past Medical History:  Diagnosis Date   GERD (gastroesophageal reflux disease)    Heart murmur    Hypertension    Past Surgical History:  Past Surgical History:  Procedure Laterality Date   CATARACT EXTRACTION W/ INTRAOCULAR LENS  IMPLANT, BILATERAL     CHOLECYSTECTOMY     COLONOSCOPY     TOTAL HIP ARTHROPLASTY Left 09/10/2022   Procedure: TOTAL HIP REPLACEMENT;  Surgeon: Willaim Sheng, MD;  Location: [facility];  Service: Orthopedics;  Laterality: Left;    Assessment & Plan Clinical Impression: Patient is a 87 y.o. year old male wwith a past medical history of hypertension, GERD, and BPH who presented to the ED on 09/08/2022 after he lost balance getting out of his chair at nighttime, falling onto his left side.  On ED evaluation, significant findings showed acute subcapital left femoral neck fracture.  Orthopedics was consulted and performed left total hip replacement on 3/14.  Postop, he was limited by significant limited mobility, primarily due to pain as he is weightbearing as tolerated in the left lower extremity with no hip precautions.  He is to follow-up with Dr. Suzy Bouchard at [facility] will repeat 6 2 weeks postop for wound check.  Aquacel dressing is to remain intact until follow-up.   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  He developed AKI, with  creatinine stabilizing at 1.6-1.7 off of IV Lasix.  He was evaluated by PT, OT and found to be appropriate for admission to inpatient rehab on 09/12/2022.   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  Patient's wife is severely debilitated, he is generally her primary caregiver.  He lives in a 1 level home with 3 steps to enter, with handrails on both sides.  Patient transferred to CIR on 09/12/2022 .    Patient currently requires max with basic self-care skills and and min to mod for basic mobility   secondary to muscle weakness and acute pain, decreased cardiorespiratoy endurance, and decreased standing balance and decreased balance strategies.  Prior to hospitalization, patient could complete ADLs with independent .  Patient will benefit from skilled intervention to decrease level of assist with basic self-care skills and increase independence with basic self-care skills prior to discharge home with care partner.  Anticipate patient will require intermittent supervision and follow up home health.  OT - End of Session Activity Tolerance: Tolerates 30+ min activity with multiple rests Endurance Deficit: Yes Endurance Deficit Description: able to perform ADL task without O2 keeping sats >90s OT Assessment Rehab Potential (ACUTE ONLY): Good OT Patient demonstrates impairments in the following area(s): Balance;Edema;Pain;Endurance;Skin Integrity OT Basic ADL's Functional Problem(s): Bathing;Dressing;Toileting OT Transfers Functional Problem(s): Toilet;Tub/Shower OT Additional Impairment(s): None OT Plan OT Intensity: Minimum of 1-2 x/day, 45 to 90 minutes OT Frequency: 5 out of 7 days OT Duration/Estimated Length of Stay: ~14 days OT Treatment/Interventions: Balance/vestibular training;Disease mangement/prevention;Neuromuscular re-education;Self Care/advanced ADL retraining;Therapeutic  Exercise;Wheelchair propulsion/positioning;DME/adaptive equipment instruction;Pain  management;Skin care/wound managment;UE/LE Strength taining/ROM;Community reintegration;Patient/family education;UE/LE Coordination activities;Discharge planning;Functional mobility training;Psychosocial support;Therapeutic Activities OT Self Feeding Anticipated Outcome(s): n/a OT Basic Self-Care Anticipated Outcome(s): setup OT Toileting Anticipated Outcome(s): mod I OT Bathroom Transfers Anticipated Outcome(s): mod I OT Recommendation Patient destination: Home Follow Up Recommendations: None Equipment Recommended: To be determined   OT Evaluation Precautions/Restrictions  Precautions Precautions: Fall Restrictions Weight Bearing Restrictions: No LLE Weight Bearing: Weight bearing as tolerated General Chart Reviewed: Yes Family/Caregiver Present: No Vital Signs  Pain Pain Assessment Pain Scale: 0-10 Pain Score: 4  Pain Type: Acute pain;Surgical pain Pain Location: Hip Pain Orientation: Left Pain Descriptors / Indicators: Aching Pain Frequency: Constant Pain Onset: On-going Pain Intervention(s): Medication (See eMAR) Multiple Pain Sites: No Home Living/Prior Functioning Home Living Family/patient expects to be discharged to:: Private residence Living Arrangements: Spouse/significant other Available Help at Discharge: Family, Available 24 hours/day Type of Home: House Home Access: Stairs to enter Technical brewer of Steps: 2 Entrance Stairs-Rails: Right, Left Home Layout: One level Bathroom Shower/Tub: Art gallery manager: Yes  Lives With: Spouse, Son Vision Baseline Vision/History: 0 No visual deficits Ability to See in Adequate Light: 0 Adequate Patient Visual Report: No change from baseline Vision Assessment?: No apparent visual deficits Perception  Perception: Within Functional Limits Praxis Praxis: Intact Cognition Cognition Overall Cognitive Status: Within Functional Limits for tasks assessed Arousal/Alertness:  Awake/alert Orientation Level: Person;Place;Situation Person: Oriented Place: Oriented Situation: Oriented Memory: Appears intact Attention: Selective;Sustained Sustained Attention: Appears intact Selective Attention: Appears intact Awareness: Appears intact Problem Solving: Appears intact Safety/Judgment: Appears intact Brief Interview for Mental Status (BIMS) Repetition of Three Words (First Attempt): 3 Temporal Orientation: Year: Correct Temporal Orientation: Month: Accurate within 5 days Temporal Orientation: Day: Correct Recall: "Sock": Yes, no cue required Recall: "Blue": Yes, no cue required Recall: "Bed": Yes, no cue required BIMS Summary Score: 15 Sensation Sensation Light Touch: Appears Intact Hot/Cold: Appears Intact Proprioception: Appears Intact Coordination Gross Motor Movements are Fluid and Coordinated: No Fine Motor Movements are Fluid and Coordinated: Yes Coordination and Movement Description: limited by pain in and weakness in left Le Motor  Motor Motor: Within Functional Limits Motor - Skilled Clinical Observations: generalized weakness and acute pain  Trunk/Postural Assessment  Cervical Assessment Cervical Assessment: Within Functional Limits Thoracic Assessment Thoracic Assessment:  (rounded shoulders) Lumbar Assessment Lumbar Assessment:  (posterior pelvic tilt) Postural Control Postural Control: Within Functional Limits  Balance Balance Balance Assessed: Yes Dynamic Sitting Balance Dynamic Sitting - Balance Support: During functional activity Dynamic Sitting - Level of Assistance: 5: Stand by assistance Dynamic Sitting - Balance Activities: Reaching for objects Static Standing Balance Static Standing - Balance Support: Bilateral upper extremity supported Static Standing - Level of Assistance: 5: Stand by assistance;4: Min assist Extremity/Trunk Assessment RUE Assessment RUE Assessment: Within Functional Limits LUE Assessment LUE  Assessment: Within Functional Limits  Care Tool Care Tool Self Care Eating   Eating Assist Level: Set up assist    Oral Care    Oral Care Assist Level: Set up assist    Bathing   Body parts bathed by patient: Right arm;Left arm;Chest;Abdomen;Right upper leg;Left upper leg;Right lower leg;Face Body parts bathed by helper: Left lower leg;Front perineal area;Buttocks   Assist Level: Moderate Assistance - Patient 50 - 74%    Upper Body Dressing(including orthotics)   What is the patient wearing?: Pull over shirt   Assist Level: Set up assist    Lower Body Dressing (excluding footwear)   What is the patient  wearing?: Underwear/pull up;Pants Assist for lower body dressing: Total Assistance - Patient < 25%    Putting on/Taking off footwear   What is the patient wearing?: Non-skid slipper socks Assist for footwear: Total Assistance - Patient < 25%       Care Tool Toileting Toileting activity   Assist for toileting: Maximal Assistance - Patient 25 - 49%     Care Tool Bed Mobility Roll left and right activity   Roll left and right assist level: Maximal Assistance - Patient 25 - 49%    Sit to lying activity   Sit to lying assist level: Maximal Assistance - Patient 25 - 49%    Lying to sitting on side of bed activity   Lying to sitting on side of bed assist level: the ability to move from lying on the back to sitting on the side of the bed with no back support.: Maximal Assistance - Patient 25 - 49%     Care Tool Transfers Sit to stand transfer   Sit to stand assist level: Moderate Assistance - Patient 50 - 74%    Chair/bed transfer   Chair/bed transfer assist level: Moderate Assistance - Patient 50 - 74%     Toilet transfer   Assist Level: Moderate Assistance - Patient 50 - 74%     Care Tool Cognition  Expression of Ideas and Wants Expression of Ideas and Wants: 4. Without difficulty (complex and basic) - expresses complex messages without difficulty and with speech  that is clear and easy to understand  Understanding Verbal and Non-Verbal Content Understanding Verbal and Non-Verbal Content: 4. Understands (complex and basic) - clear comprehension without cues or repetitions   Memory/Recall Ability     Refer to Care Plan for Long Term Goals  SHORT TERM GOAL WEEK 1 OT Short Term Goal 1 (Week 1): Pt will be able to perform stand pivot with RW to toilet/BSC with min A OT Short Term Goal 2 (Week 1): Pt will perform sit to stands in ADLs with supervision OT Short Term Goal 3 (Week 1): Pt will be able to maintain standing while performing clothing management with min A OT Short Term Goal 4 (Week 1): Pt will perform LB dressing including shoes with min A with AE as needed  Recommendations for other services: None    Skilled Therapeutic Intervention 1:1 OT eval initiated with OT purpose, role and goals discussed. Pt received in the w/c. Self care retraining at shower level . Pt able to transfer into shower with scoot pivot with min A. Pt able to shower in sitting position for most of it. Performed sit to stand with grab bar with heavy reliance on the bar with min A for Ot to assist with washing buttocks and peri area. Pt performed stand pivot to get back into w/c at end of shower - with min A. Discussed LB techniques for pain and mobility - LB dressing with max to total A. Pt able to perform grooming at the sink with setup. Pt left with left LE elevated on leg rest and ice applied to left LE    ADL ADL Eating: Independent Grooming: Setup Where Assessed-Grooming: Sitting at sink Upper Body Bathing: Setup Where Assessed-Upper Body Bathing: Shower Lower Body Bathing: Moderate assistance Where Assessed-Lower Body Bathing: Shower Upper Body Dressing: Setup Where Assessed-Upper Body Dressing: Chair Lower Body Dressing: Maximal assistance Where Assessed-Lower Body Dressing: Chair Gaffer Transfer: Minimal assistance Social research officer, government Method: Theatre stage manager: Radio broadcast assistant  Mobility  Bed Mobility Bed Mobility: Rolling Right;Rolling Left Rolling Right: Maximal Assistance - Patient 25-49% Rolling Left: Maximal Assistance - Patient 25-49% Transfers Sit to Stand: Minimal Assistance - Patient > 75%   Discharge Criteria: Patient will be discharged from OT if patient refuses treatment 3 consecutive times without medical reason, if treatment goals not met, if there is a change in medical status, if patient makes no progress towards goals or if patient is discharged from hospital.  The above assessment, treatment plan, treatment alternatives and goals were discussed and mutually agreed upon: by patient  Nicoletta Ba 09/13/2022, 11:19 AM

## 2022-09-13 NOTE — Progress Notes (Signed)
On admission, patient's BL feet noted to have dry ulcerations on medial ball of foot. States they have been present for about 6 weeks and related to foot shape. States usually go away on their own. Skin not fully open but some cracks/crust present.

## 2022-09-13 NOTE — Plan of Care (Signed)
Problem: RH Balance Goal: LTG: Patient will maintain dynamic sitting balance (OT) Description: LTG:  Patient will maintain dynamic sitting balance with assistance during activities of daily living (OT) 09/13/2022 1121 by Merrilee Seashore, OT Flowsheets (Taken 09/13/2022 1121) LTG: Pt will maintain dynamic sitting balance during ADLs with: Independent with assistive device 09/13/2022 1121 by Merrilee Seashore, OT Reactivated Goal: LTG Patient will maintain dynamic standing with ADLs (OT) Description: LTG:  Patient will maintain dynamic standing balance with assist during activities of daily living (OT)  09/13/2022 1121 by Merrilee Seashore, OT Flowsheets (Taken 09/13/2022 1121) LTG: Pt will maintain dynamic standing balance during ADLs with: Independent with assistive device 09/13/2022 1121 by Merrilee Seashore, OT Reactivated   Problem: Sit to Stand Goal: LTG:  Patient will perform sit to stand with assistance level (PT) Description: LTG:  Patient will perform sit to stand with assistance level (PT) Reactivated Goal: LTG:  Patient will perform sit to stand in prep for activites of daily living with assistance level (OT) Description: LTG:  Patient will perform sit to stand in prep for activites of daily living with assistance level (OT) 09/13/2022 1121 by Merrilee Seashore, OT Flowsheets (Taken 09/13/2022 1121) LTG: PT will perform sit to stand in prep for activites of daily living with assistance level: Independent with assistive device 09/13/2022 1121 by Merrilee Seashore, OT Reactivated   Problem: RH Bathing Goal: LTG Patient will bathe all body parts with assist levels (OT) Description: LTG: Patient will bathe all body parts with assist levels (OT) 09/13/2022 1121 by Merrilee Seashore, OT Flowsheets (Taken 09/13/2022 1121) LTG: Pt will perform bathing with assistance level/cueing: Set up assist  09/13/2022 1121 by Merrilee Seashore, OT Reactivated   Problem: RH Dressing Goal: LTG  Patient will perform upper body dressing (OT) Description: LTG Patient will perform upper body dressing with assist, with/without cues (OT). 09/13/2022 1121 by Merrilee Seashore, OT Flowsheets (Taken 09/13/2022 1121) LTG: Pt will perform upper body dressing with assistance level of: Set up assist 09/13/2022 1121 by Merrilee Seashore, OT Reactivated Goal: LTG Patient will perform lower body dressing w/assist (OT) Description: LTG: Patient will perform lower body dressing with assist, with/without cues in positioning using equipment (OT) 09/13/2022 1121 by Merrilee Seashore, OT Flowsheets (Taken 09/13/2022 1121) LTG: Pt will perform lower body dressing with assistance level of: Set up assist 09/13/2022 1121 by Merrilee Seashore, OT Reactivated   Problem: RH Toileting Goal: LTG Patient will perform toileting task (3/3 steps) with assistance level (OT) Description: LTG: Patient will perform toileting task (3/3 steps) with assistance level (OT)  09/13/2022 1121 by Merrilee Seashore, OT Flowsheets (Taken 09/13/2022 1121) LTG: Pt will perform toileting task (3/3 steps) with assistance level: Independent with assistive device 09/13/2022 1121 by Merrilee Seashore, OT Reactivated   Problem: RH Toilet Transfers Goal: LTG Patient will perform toilet transfers w/assist (OT) Description: LTG: Patient will perform toilet transfers with assist, with/without cues using equipment (OT) 09/13/2022 1121 by Merrilee Seashore, OT Flowsheets (Taken 09/13/2022 1121) LTG: Pt will perform toilet transfers with assistance level of: Independent with assistive device 09/13/2022 1121 by Merrilee Seashore, OT Reactivated   Problem: RH Tub/Shower Transfers Goal: LTG Patient will perform tub/shower transfers w/assist (OT) Description: LTG: Patient will perform tub/shower transfers with assist, with/without cues using equipment (OT) 09/13/2022 1121 by Merrilee Seashore, OT Flowsheets (Taken 09/13/2022 1121) LTG: Pt will  perform tub/shower stall transfers with assistance level of: Set up assist  09/13/2022 1121 by Merrilee Seashore, OT Reactivated

## 2022-09-14 DIAGNOSIS — S72002D Fracture of unspecified part of neck of left femur, subsequent encounter for closed fracture with routine healing: Secondary | ICD-10-CM | POA: Diagnosis not present

## 2022-09-14 NOTE — Progress Notes (Signed)
[facility] Individual Statement of Services  Patient Name:  Perry Rosales  Date:  09/14/2022  Welcome to the [facility].  Our goal is to provide you with an individualized program based on your diagnosis and situation, designed to meet your specific needs.  With this comprehensive rehabilitation program, you will be expected to participate in at least 3 hours of rehabilitation therapies Monday-Friday, with modified therapy programming on the weekends.  Your rehabilitation program will include the following services:  Physical Therapy (PT), Occupational Therapy (OT), Speech Therapy (ST), 24 hour per day rehabilitation nursing, Therapeutic Recreaction (TR), Neuropsychology, Care Coordinator, Rehabilitation Medicine, Nutrition Services, Pharmacy Services, and Other  Weekly team conferences will be held on Wesnesdays to discuss your progress.  Your Inpatient Rehabilitation Care Coordinator will talk with you frequently to get your input and to update you on team discussions.  Team conferences with you and your family in attendance may also be held.  Expected length of stay:  16-18 Days  Overall anticipated outcome:  Min A  Depending on your progress and recovery, your program may change. Your Inpatient Rehabilitation Care Coordinator will coordinate services and will keep you informed of any changes. Your Inpatient Rehabilitation Care Coordinator's name and contact numbers are listed  below.  The following services may also be recommended but are not provided by the [facility]:   [place] will be made to provide these services after discharge if needed.  Arrangements include referral to agencies that provide these services.  Your insurance has been verified to be:   Medicare A & B Your primary doctor is:   Leanna Battles, MD  Pertinent information will  be shared with your doctor and your insurance company.  Inpatient Rehabilitation Care Coordinator:  Erlene Quan, [place] or [phone]  Information discussed with and copy given to patient by: [name], 09/14/2022, 9:03 AM

## 2022-09-14 NOTE — Progress Notes (Signed)
Restless first part of shift. "I just can't get comfortable." Spilled water in bed. Patient removed clothes. PRN robaxin and melatonin given at 0107. Using urinal. LBM 03/17, refused miralax. Perry Rosales A

## 2022-09-14 NOTE — Progress Notes (Signed)
Occupational Therapy Session Note  Patient Details  Name: Perry Rosales MRN: [id number] Date of Birth: 10-Mar-1936  Today's Date: 09/14/2022 OT Individual Time: 3235-5732 session 1 OT Individual Time Calculation (min): 61 min  Session 2: 1415-1529    Short Term Goals: Week 1:  OT Short Term Goal 1 (Week 1): Pt will be able to perform stand pivot with RW to toilet/BSC with min A OT Short Term Goal 2 (Week 1): Pt will perform sit to stands in ADLs with supervision OT Short Term Goal 3 (Week 1): Pt will be able to maintain standing while performing clothing management with min A OT Short Term Goal 4 (Week 1): Pt will perform LB dressing including shoes with min A with AE as needed  Skilled Therapeutic Interventions/Progress Updates:  Session 1: Pt handed off from PT. Pt agreeable to OT session.   Pt completed ambulatory therapeutic activity where pt instructed to ambulate with Rw between to tables positoned ~ 10 ft apart to collect matching cones/dots to simulate ADL level functional mobility . Pt able to transport items from each table to challenge dynamic balance, asses RW mgmt and facilitate improved functional endurance. Pt completed task with overall CGA.  O2 97% on RA HR 94 bpm. Pt limited mostly by decreased activity tolerance needing to sit after one bout of 10 ft but then able to rest and complete additional 40 ft with RW and CGA. Pt completed sit>stands with RW and CGA with intermittent MIN cues for hand placement.   Pt transported back to room with total A for time mgmt. Pt completed functional ambulation around end of bed with RW and CGA to recliner.   Ended session with pt seated in recliner with alarm belt activated and all needs within reach.   Session 2: pt greeted asleep in recliner, but easily able to arouse and agreeable to OT intervention. Pt completed stand pivot to w/c with Rw and CGA. Total A transport to gym for time mgmt with session focused on various therapeutic  activities focused on dynamic standing balance, BLE/BUE strength, and increasing overall activity tolerance for ADL participation.   Pt completed 1 min of toe taps on 3 inch blocks with BUE support as precursor to stepping over shower threshold with CGA with no LOB. Pt completed dynamic reaching tasks with pt reaching across midline to transport horseshoes from R side<>L side with no UE support to simulate dynamic reaching in ADL setting with overall CGA.   Pt able to work on targeted steps with LUE to facilitate improved LLE strength/ endurance/ dynamic balance with therapist stating colored dot on floor and pt instructed to shift weight to step onto dot with an emphasis on staggered stance during ADL participation. Pt completed task for 1 min with BUE support with no LOB.   Worked on higher level balance tasks with pt standing on both compliant and non compliant surfaces to participate in ball tosses with student with no UE support to challenge balance reactions. Pt completed task with light MINA  for balance support.   Pt completed functional ambulation back to room with RW and CGA, pt did require one seated rest break d/t reports of fatigue at half way point.   Ended session with pt seated in recliner with all needs within reach and chair alarm activated.     Therapy Documentation Precautions:  Precautions Precautions: Fall Restrictions Weight Bearing Restrictions: Yes LLE Weight Bearing: Weight bearing as tolerated  Pain: session 1: unrated pain reported in LLE,  rest breaks provided as needed.  Session 2: 4/10 pain in LLE, rest breaks provided as needed, pt declined pain meds from nurse during session.     Therapy/Group: Individual Therapy  Corinne Ports [place] 09/14/2022, 12:07 PM

## 2022-09-14 NOTE — Progress Notes (Signed)
Inpatient Rehabilitation  Patient information reviewed and entered into eRehab system by Bronson Bressman Raeleen Winstanley, OTR/L, Rehab Quality Coordinator.   Information including medical coding, functional ability and quality indicators will be reviewed and updated through discharge.   

## 2022-09-14 NOTE — Progress Notes (Signed)
Patient ID: Perry Rosales, male   DOB: 1935-11-12, 87 y.o.   MRN: [id number] Met with the patient to review current situation, rehab process,team conference and plan of care. Patient very drowsy; reported insomnia. Reviewed medications and issues; HF with Farxiga, retention on flomax,Pain and constipation addressed. Reviewed medication changes and dietary modification recommendations. Continue to follow along to address educational needs to facilitate preparation for discharge. Margarito Liner

## 2022-09-14 NOTE — Progress Notes (Signed)
Inpatient Rehabilitation Care Coordinator Assessment and Plan Patient Details  Name: Perry Rosales MRN: [id number] Date of Birth: Nov 12, 1935  Today's Date: 09/14/2022  Hospital Problems: Principal Problem:   Fracture of left hip [facility])  Past Medical History:  Past Medical History:  Diagnosis Date   GERD (gastroesophageal reflux disease)    Heart murmur    Hypertension    Past Surgical History:  Past Surgical History:  Procedure Laterality Date   CATARACT EXTRACTION W/ INTRAOCULAR LENS  IMPLANT, BILATERAL     CHOLECYSTECTOMY     COLONOSCOPY     TOTAL HIP ARTHROPLASTY Left 09/10/2022   Procedure: TOTAL HIP REPLACEMENT;  Surgeon: Willaim Sheng, MD;  Location: [place];  Service: Orthopedics;  Laterality: Left;   Social History:  reports that he quit smoking about 2 years ago. His smoking use included cigarettes. He has a 30.00 pack-year smoking history. He has been exposed to tobacco smoke. He has never used smokeless tobacco. He reports that he does not drink alcohol and does not use drugs.  Family / Support Systems Marital Status: Married How Long?: 65 years Patient Roles: Spouse, Parent Spouse/Significant Other: Pamala Hurry Children: Son, Education officer, environmental Other Supports: N/A Anticipated Caregiver: [name], son Ability/Limitations of Caregiver: Min A Caregiver Availability: 24/7 Family Dynamics: Close-knit, supportive family.  Social History Preferred language: English Religion:  Cultural Background: Patient retired. Supporting spouse Education: [place] - How often do you need to have someone help you when you read instructions, pamphlets, or other written material from your doctor or pharmacy?: Never Writes: Yes Employment Status: Retired Date Retired/Disabled/Unemployed: 2012 Public relations account executive Issues: N/A Guardian/Conservator: N/A   Abuse/Neglect Abuse/Neglect Assessment Can Be Completed: Yes Physical Abuse: Denies Verbal Abuse: Denies Sexual Abuse:  Denies Exploitation of patient/patient's resources: Denies Self-Neglect: Denies  Patient response to: Social Isolation - How often do you feel lonely or isolated from those around you?: Never  Emotional Status Pt's affect, behavior and adjustment status: Pleasant Recent Psychosocial Issues: Coping Psychiatric History: N/A Substance Abuse History: N/A  Patient / Family Perceptions, Expectations & Goals Pt/Family understanding of illness & functional limitations: yes Premorbid pt/family roles/activities: Patient independent overall Anticipated changes in roles/activities/participation: Anticipating some assistance at home from son who has moved in and [facility] Pt/family expectations/goals: [place]: None Premorbid Home Care/DME Agencies: Other (Comment) Librarian, academic (2 wheels), Rollator (4 wheels), BSC/3in1, Shower seat, Wheelchair - manual) Transportation available at discharge: son, [name] Is the patient able to respond to transportation needs?: Yes In the past 12 months, has lack of transportation kept you from medical appointments or from getting medications?: No In the past 12 months, has lack of transportation kept you from meetings, work, or from getting things needed for daily living?: No Resource referrals recommended: Neuropsychology  Discharge Planning Living Arrangements: Spouse/significant other Support Systems: Spouse/significant other, Children Type of Residence: Private residence (2 steps to enter) Insurance underwriter Resources: Chartered certified accountant Resources: Radio broadcast assistant Screen Referred: No Living Expenses: Own Money Management: Patient, Spouse, Family Does the patient have any problems obtaining your medications?: No Home Management: Independent Patient/Family Preliminary Plans: Patient plans to remain independent but will have assistance from his son if needed Care Coordinator Barriers to Discharge: Decreased caregiver  support, Home environment access/layout Care Coordinator Anticipated Follow Up Needs: HH/OP Expected length of stay: 16-18 Days  Clinical Impression SW met with patient, introduced self and explained role. Patient anticipates discharging back home with his spouse and his son who has recently moved in  to assist and support. Patient  has 2 steps to enter his home. DME patient has currently: Conservation officer, nature (2 wheels), Rollator (4 wheels), BSC/3in1, Shower seat, Wheelchair - manual. No additional questions or concerns. SW and patient will call son at bedside after conference on Wednesday .  [name] 09/14/2022, 1:30 PM

## 2022-09-14 NOTE — Progress Notes (Signed)
Physical Therapy Session Note  Patient Details  Name: Perry Rosales MRN: [id number] Date of Birth: 05/28/1936  Today's Date: 09/14/2022 PT Individual Time: [id number] PT Individual Time Calculation (min): 71 min   Short Term Goals: Week 1:  PT Short Term Goal 1 (Week 1): Pt will transfer bed<>chair with MinA + RW PT Short Term Goal 2 (Week 1): Pt will complete bed mobility with MinA PT Short Term Goal 3 (Week 1): Pt will amb 58ft with MinA + RW PT Short Term Goal 4 (Week 1): Pt initiate stair training  Skilled Therapeutic Interventions/Progress Updates:    Pt received supine w/ HOB elevated. Pt agreeable to PT services. Pt denis any pain at rest. Supine<>sit at EOB w/ Min A for LE maangement and power. Sitting at EOB pt qucikly washed his face using wet rag w/ supervision for sitting balance. Pt's LE garments donned to start in sitting w/ Mod A s. Sit<>stand w/ Min A for power up and standing tolerance, and LE dressing completed w/ Mod A.   Pt performed ~26ft of gait out of room before feeling "faint" and requesting to sit. SpO2 checked w/ pt in sitting read (99% at first and after 10 seconds dropped to 91%. Pt cued for pursed-lipped breathing pattern which assisted in bringing the SPO2 up to 98%. Pt transported to therapy gym dependently in [facility] for energy conservation. Engaged in blocked practice sit to stands x6 w/ vc for foot placement, anterior wt shift and upper trunk extension. RW and Min A provided to assist in power up.   Gait training ~15 ft Forward and backward walking w/ RW and Min A x2. Vc for L knee flexion to increase L foot clearance, and heel toe pattern ambulating forward, and toe heel back. Pt was able to demonstrate this pattern w/ consistent vc.   Performed Step Ups 5x on 2" step --> 5x on 4" step leading w/ L foot using RW and CGA for stability. Aim was to emphasis increased knee flexion and DF in LLE.   Pt's OT arrived at the end of session. Pt left sitting in WC in the  care of OT.   Therapy Documentation Precautions:  Precautions Precautions: Fall Restrictions Weight Bearing Restrictions: Yes LLE Weight Bearing: Weight bearing as tolerated General:    Pain: Pain Assessment Pain Scale: 0-10 Pain Score: Asleep Pain Type: Acute pain;Surgical pain Pain Location: Hip Pain Orientation: Left Pain Descriptors / Indicators: Aching;Sore Pain Frequency: Constant Pain Onset: On-going Pain Intervention(s): Medication (See eMAR) Multiple Pain Sites: No       Therapy/Group: Individual Therapy  Shayonna Ocampo 09/14/2022, 9:27 AM

## 2022-09-14 NOTE — Discharge Instructions (Addendum)
Inpatient Rehab Discharge Instructions  Perry Rosales Discharge date and time: No discharge date for patient encounter.   Activities/Precautions/ Functional Status: Activity: activity as tolerated Diet: regular diet Wound Care: Routine skin checks Functional status:  ___ No restrictions     ___ Walk up steps independently ___ 24/7 supervision/assistance   ___ Walk up steps with assistance ___ Intermittent supervision/assistance  ___ Bathe/dress independently ___ Walk with walker     _x__ Bathe/dress with assistance ___ Walk Independently    ___ Shower independently ___ Walk with assistance    ___ Shower with assistance ___ No alcohol     ___ Return to work/school ________  COMMUNITY REFERRALS UPON DISCHARGE:    Home Health:   PT     OT                       Agency: [facility] Phone: [phone]   Medical Equipment/Items Ordered: [name] and Bedside Commode                                                 Agency/Supplier: [employer] [phone]   Special Instructions: No driving smoking or alcohol  Continue aspirin 81 mg twice daily until 10/12/2022 then 81 mg daily   My questions have been answered and I understand these instructions. I will adhere to these goals and the provided educational materials after my discharge from the hospital.  Patient/Caregiver Signature _______________________________ Date __________  Clinician Signature _______________________________________ Date __________  Please bring this form and your medication list with you to all your follow-up doctor's appointments.

## 2022-09-14 NOTE — Progress Notes (Signed)
PROGRESS NOTE   Subjective/Complaints:  No new c/os, Pt has R thigh numbess and intermittent pain , no back pain , worsening with Coughing, has hx of shingles R  low back    ROS: Right thigh numbness, no weakness, No breathing issues, no bowel or bladder c/os  Objective:   No results found. Recent Labs    09/13/22 0552  WBC 10.5  HGB 12.5*  HCT 36.7*  PLT 143*    Recent Labs    09/12/22 1627 09/13/22 0552  NA 139 137  K 3.6 3.5  CL 97* 97*  CO2 29 27  GLUCOSE 132* 128*  BUN 52* 57*  CREATININE 1.43* 1.45*  CALCIUM 7.3* 7.4*     Intake/Output Summary (Last 24 hours) at 09/14/2022 0927 Last data filed at 09/14/2022 0900 Gross per 24 hour  Intake 950 ml  Output 900 ml  Net 50 ml         Physical Exam: Vital Signs Blood pressure 126/60, pulse 78, temperature 97.8 F (36.6 C), resp. rate 16, height 5\' 9"  (1.753 m), weight 74 kg, SpO2 93 %.  General: No acute distress Mood and affect are appropriate Heart: Regular rate and rhythm no rubs murmurs or extra sounds Lungs: Clear to auscultation, breathing unlabored, no rales or wheezes Abdomen: Positive bowel sounds, soft nontender to palpation, nondistended Extremities: No clubbing, cyanosis, or edema  Skin: C/D/I. + Dry, unstageable ulcer with overlying eschar on the base of the left first MTP. - not examined today + L hip aquacell dressing clean; mild surrounding edema  MSK:      No apparent deformity.       Strength:                RUE: 5/5 SA, 5/5 EF, 5/5 EE, 5/5 WE, 5/5 FF, 5/5 FA                 LUE: 5/5 SA, 5/5 EF, 5/5 EE, 5/5 WE, 5/5 FF, 5/5 FA                 RLE: 3/5 HF, 5/5 KE, 5/5 DF, 5/5 EHL, 5/5 PF                 LLE:  2/5 HF, 2/5 KE, 5/5 DF, 5/5 EHL, 5/5 PF    Neurologic exam:  Cognition: AAO to person, place, time and event.  Language: Fluent, No substitutions or neoglisms. No dysarthria. Names 3/3 objects correctly.  Memory: Recalls  3/3 objects at 5 minutes. No apparent deficits  Insight: Good  insight into current condition.  Mood: Pleasant affect, appropriate mood.  Sensation: Intact to light touch in BL UE and Les  Reflexes: 2+ in BL UE and LEs. Negative Hoffman's and babinski signs bilaterally.  CN: 2-12 grossly intact.    Assessment/Plan: 1. Functional deficits which require 3+ hours per day of interdisciplinary therapy in a comprehensive inpatient rehab setting. Physiatrist is providing close team supervision and 24 hour management of active medical problems listed below. Physiatrist and rehab team continue to assess barriers to discharge/monitor patient progress toward functional and medical goals  Care Tool:  Bathing    Body parts bathed  by patient: Right arm, Left arm, Chest, Abdomen, Right upper leg, Left upper leg, Right lower leg, Face   Body parts bathed by helper: Left lower leg, Front perineal area, Buttocks     Bathing assist Assist Level: Moderate Assistance - Patient 50 - 74%     Upper Body Dressing/Undressing Upper body dressing   What is the patient wearing?: Pull over shirt    Upper body assist Assist Level: Set up assist    Lower Body Dressing/Undressing Lower body dressing      What is the patient wearing?: Underwear/pull up, Pants     Lower body assist Assist for lower body dressing: Total Assistance - Patient < 25%     Toileting Toileting    Toileting assist Assist for toileting: Maximal Assistance - Patient 25 - 49%     Transfers Chair/bed transfer  Transfers assist     Chair/bed transfer assist level: Moderate Assistance - Patient 50 - 74%     Locomotion Ambulation   Ambulation assist      Assist level: Moderate Assistance - Patient 50 - 74% Assistive device: Walker-rolling     Walk 10 feet activity   Assist  Walk 10 feet activity did not occur: Safety/medical concerns (2/2 pain)        Walk 50 feet activity   Assist Walk 50 feet with 2  turns activity did not occur: Safety/medical concerns (2/2 pain)         Walk 150 feet activity   Assist Walk 150 feet activity did not occur: Safety/medical concerns (2/2 pain)         Walk 10 feet on uneven surface  activity   Assist Walk 10 feet on uneven surfaces activity did not occur: Safety/medical concerns (2/2 pain)         Wheelchair     Assist Is the patient using a wheelchair?: No   Wheelchair activity did not occur: N/A         Wheelchair 50 feet with 2 turns activity    Assist    Wheelchair 50 feet with 2 turns activity did not occur: N/A       Wheelchair 150 feet activity     Assist  Wheelchair 150 feet activity did not occur: N/A       Blood pressure 126/60, pulse 78, temperature 97.8 F (36.6 C), resp. rate 16, height 5\' 9"  (1.753 m), weight 74 kg, SpO2 93 %.   Medical Problem List and Plan: 1. Functional deficits secondary to left hip fracture status post total hip replacement on 3/14             -patient may shower; Aquacel dressing was from being in place for 2 weeks until Ortho follow-up              -ELOS/Goals: 16 to 18 days, min assist PT /OT goals  - Wean oxygen as able, not home device     2.  Antithrombotics: -DVT/anticoagulation:  Pharmaceutical: Other (comment) aspirin 81 mg BID             -antiplatelet therapy: ASA as above  3. Pain Management: Scheduled Tylenol 1000 mg 3 times daily, as needed oxycodone 5 to 10 mg, Robaxin as needed  -Gabapentin 100 mg twice daily added for right thigh burning pain, ? Radicular vs post herpetic neuralgia 4. Mood/Behavior/Sleep: Elavil 25 mg nightly             -antipsychotic agents: None   - 3/16: Much improved with  as needed trazodone, assess for standing regimen, scheduled 25 mg nightly at 2000  5. Neuropsych/cognition: This patient is capable of making decisions on his own behalf. 6. Skin/Wound Care: Aquacel dressing to remain over surgical site for 2 weeks postop  (3/28).  Otherwise, routine wound management.  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  7. Fluids/Electrolytes/Nutrition: Regular diet with thins, heart healthy.  Monitor admission labs. -Stable  8.  Acute on chronic systolic and diastolic CHF EF [id number] -Started on Farxiga, metoprolol inpatient -Weaned off of Lasix due to AKI -Daily weights - stable Filed Weights   09/12/22 1525 09/13/22 0604 09/14/22 0500  Weight: 75.3 kg 75 kg 74 kg    9.  AKI on CKD.  Creatinine stable at 1.6-1.7.  Admission labs pending. - Admission BUN, creatinine stable.  10.  Hypertension.  Metoprolol as above.  Monitor.  Home amlodipine currently held. -Mildly low diastolic, systolic stable, monitor    09/14/2022    5:32 AM 09/14/2022    5:00 AM 09/13/2022    7:13 PM  Vitals with BMI  Weight  163 lbs 2 oz   BMI  [id number]   Systolic [id number]  [id number]  Diastolic 60  63  Pulse 78  83    11.  BPH.  On Flomax 0.4 mg daily. -Bladder scans low  12.  GERD.  Continue PPI.  13.  Constipation.  Last bowel movement prior to admission. -Senokot-S, 1 tablet twice daily and MiraLAX twice daily    LOS: 2 days A FACE TO FACE EVALUATION WAS PERFORMED  Charlett Blake 09/14/2022, 9:27 AM

## 2022-09-14 NOTE — IPOC Note (Signed)
Overall Plan of Care [facility]) Patient Details Name: Perry Rosales MRN: [id number] DOB: 31-Mar-1936  Admitting Diagnosis: Fracture of left hip [facility])  Hospital Problems: Principal Problem:   Fracture of left hip [facility])     Functional Problem List: Nursing Bladder, Bowel, Pain, Edema, Safety, Endurance, Medication Management, Motor  PT Balance, Endurance, Motor, Pain, Safety  OT Balance, Edema, Pain, Endurance, Skin Integrity  SLP    TR         Basic ADL's: OT Bathing, Dressing, Toileting     Advanced  ADL's: OT       Transfers: PT Bed Mobility, Bed to Chair, Car  OT Toilet, Tub/Shower     Locomotion: PT Ambulation, Stairs     Additional Impairments: OT None  SLP        TR      Anticipated Outcomes Item Anticipated Outcome  Self Feeding n/a  Swallowing      Basic self-care  setup  Toileting  mod I   Bathroom Transfers mod I  Bowel/Bladder  continent B/B  Transfers  ModI  Locomotion  supervision  Communication     Cognition     Pain  less than 4  Safety/Judgment  remain fall free while in rehab   Therapy Plan: PT Intensity: Minimum of 1-2 x/day ,45 to 90 minutes PT Frequency: 5 out of 7 days PT Duration Estimated Length of Stay: 14-17 days OT Intensity: Minimum of 1-2 x/day, 45 to 90 minutes OT Frequency: 5 out of 7 days OT Duration/Estimated Length of Stay: ~14 days     Team Interventions: Nursing Interventions Patient/Family Education, Disease Management/Prevention, Skin Care/Wound Management, Discharge Planning, Bladder Management, Pain Management, Psychosocial Support, Bowel Management, Medication Management  PT interventions Ambulation/gait training, Balance/vestibular training, Discharge planning, DME/adaptive equipment instruction, Functional mobility training, Patient/family education, Stair training, Therapeutic Activities, Therapeutic Exercise, UE/LE Strength taining/ROM, UE/LE Coordination activities  OT Interventions  Balance/vestibular training, Disease mangement/prevention, Neuromuscular re-education, Self Care/advanced ADL retraining, Therapeutic Exercise, Wheelchair propulsion/positioning, DME/adaptive equipment instruction, Pain management, Skin care/wound managment, UE/LE Strength taining/ROM, Community reintegration, Patient/family education, UE/LE Coordination activities, Discharge planning, Functional mobility training, Psychosocial support, Therapeutic Activities  SLP Interventions    TR Interventions    SW/CM Interventions Discharge Planning, Psychosocial Support, Patient/Family Education, Disease Management/Prevention   Barriers to Discharge MD  Medical stability  Nursing Decreased caregiver support, Home environment access/layout, Wound Care, Weight bearing restrictions Son to move into home to provide up to min A. House 1 level w/2 ste entry with rail R/L. Wife has dementia  PT New oxygen, Weight bearing restrictions    OT      SLP      SW Decreased caregiver support, Home environment access/layout     Team Discharge Planning: Destination: PT-Home ,OT- Home , SLP-  Projected Follow-up: PT-Outpatient PT, OT-  None, SLP-  Projected Equipment Needs: PT-Rolling walker with 5" wheels, OT- To be determined, SLP-  Equipment Details: PT- , OT-  Patient/family involved in discharge planning: PT- Patient,  OT-Patient, SLP-   MD ELOS: 16-18d Medical Rehab Prognosis:  Excellent Assessment: The patient has been admitted for CIR therapies with the diagnosis of Left hip fracture. The team will be addressing functional mobility, strength, stamina, balance, safety, adaptive techniques and equipment, self-care, bowel and bladder mgt, patient and caregiver education, RIght thigh pain, post op confusion. Goals have been set at [facility]. Anticipated discharge destination is home with family support .        See Team Conference Notes for weekly updates  to the plan of care

## 2022-09-15 NOTE — Progress Notes (Signed)
Physical Therapy Session Note  Patient Details  Name: Perry Rosales MRN: [id number] Date of Birth: 05-Aug-1935  Today's Date: 09/15/2022 PT Individual Time: 1304-1400 PT Individual Time Calculation (min): 56 min   Short Term Goals: Week 1:  PT Short Term Goal 1 (Week 1): Pt will transfer bed<>chair with MinA + RW PT Short Term Goal 2 (Week 1): Pt will complete bed mobility with MinA PT Short Term Goal 3 (Week 1): Pt will amb 27ft with MinA + RW PT Short Term Goal 4 (Week 1): Pt initiate stair training  Skilled Therapeutic Interventions/Progress Updates:    Pt received sitting upright in recliner chair and agreeable to PT services. Pt denies any pain. Has not been eating well due to mouth pain. Explains that lidocaine only helps moderately and makes the taste of his food unenjoyable.  Sit <>stands performed throughout session was CGA. Pt demonstrated good recall from last session requiring fewer vc for the sequence of movement.   Pt performed Gait ~150x2 w/ RW and CGA. Vc to pick knees up to increase B foot clearance. Cues for pt to remain closer to RW to avoid LOB. Pt engaged in stepping wt shifts, x10 forwards, x10 laterally using RLE to emphasize WB through LLE. Performed L stepping wt shifts over 4" yoga block to encourage LLE flexion in order to carryover during gait. Pt performed Nu-step to emphasize reciprocal gait pattern x5 min level 2, x3 min level 4.   Pt ambulated back to room w/ RW and CGA. Pt returned to sitting in recliner chair alarm on, and all needs met.      Therapy Documentation Precautions:  Precautions Precautions: Fall Restrictions Weight Bearing Restrictions: Yes LLE Weight Bearing: Weight bearing as tolerated General:         Therapy/Group: Individual Therapy  [name] 09/15/2022, 2:01 PM

## 2022-09-15 NOTE — Progress Notes (Signed)
Occupational Therapy Session Note  Patient Details  Name: Perry Rosales MRN: [id number] Date of Birth: [birth year]  Today's Date: 09/15/2022 OT Individual Time: [id number] session 1 OT Individual Time Calculation (min): 58 min  Session 2: 1009-1108   Short Term Goals: Week 1:  OT Short Term Goal 1 (Week 1): Pt will be able to perform stand pivot with RW to toilet/BSC with min A OT Short Term Goal 2 (Week 1): Pt will perform sit to stands in ADLs with supervision OT Short Term Goal 3 (Week 1): Pt will be able to maintain standing while performing clothing management with min A OT Short Term Goal 4 (Week 1): Pt will perform LB dressing including shoes with min A with AE as needed  Skilled Therapeutic Interventions/Progress Updates:  Session1: Pt greeted supine in bed bed, pt agreeable to OT intervention. Pt reports losing his dentures, but able to locate them in bed after initiating bed mobility.  Session focus on BADL reeducation, functional mobility, dynamic standing balance and decreasing overall caregiver burden.    Pt completed supine>sit with MIN A to manage maneuvering LLE to EOB. Pt sit>stand and completed ambulatory toilet transfer with RW and CGA. Pt stood for toileting with supervision for 3/3 toileting tasks. Pt entered walkin shower with RW and CGA. Pt completed all bathing from shower seat/standing with CGA, education provided on LH sponge for LB bathing, plan to try AD next time.   Pt exited shower with Rw and CGA, pt completed dressing from w/c with overall set- up- CGA. Pt completed seated grooming tasks at sink MODI including oral care and hair brushing/styling.               Ended session with pt seated in recliner with all needs within reach and chair alarm activated.                   Session 2: Pt greeted seated in recliner, pt agreeable to OT intervention. Pt completed functional mobility >100 ft with Rw and CGA. Pt transported remainder of distance for energy  conservation. Discussed ADL transfers in apt with pt completing ambulatory shower transfer to walkin shower with pt preferring to step in backwards with RW and then backing up to built in seat positioned at 45* angle with pt able to bring RW into shower with him. Pt completed transfer with [place]. Education provided on general fall prevention for showering as well as recommendation of having grab bar installed however if unable to install grab bar recommended suction grab bar for balance point only when standing for LB bathing and during transfer. Recommended that pts son be present for bathing tasks.   Pt also completed ambulatory bed transfer to [place] with Rw and CGA, education provided on using gait belt as needed for leg lifter if difficulty continues with elevating LLE.                     Total A transport to gym for time mgmt. Pt completed x9 sit>stands with CGA from w/c with pt able to engage in connect 4 task and able to place game pieces with no UE support or LOB. Pt also completed dynamic reaching tasks on airex with pt reaching across midline with no UE support to transport cones from R<>L side.   Pt completed functional ambulation back to room with RW and CGA.   Ended session with pt seated in recliner with all needs within reach and chair alarm activated.  Therapy Documentation Precautions:  Precautions Precautions: Fall Restrictions Weight Bearing Restrictions: Yes LLE Weight Bearing: Weight bearing as tolerated  Pain: Session 1: 4/10 pain in LLE, pain meds provided by nurse, rest breaks provided as needed.  Session 2: 3/10 pain reported in LLE, rest breaks provided as needed.     Therapy/Group: Individual Therapy  Corinne Ports [facility] 09/15/2022, 11:52 AM

## 2022-09-15 NOTE — Progress Notes (Signed)
     Subjective: Patient seen this afternoon. Reports good progress with PT. Ambulated 146ft w/ RW twice today. Pleased with functional improvement and pain control. Reports his son was by yesterday. No concerns.   Objective:   VITALS:   Vitals:   09/15/22 0443 09/15/22 0500 09/15/22 1515 09/15/22 1933  BP: 139/60  124/64 115/67  Pulse: 72  (!) 57 87  Resp: 18  16 17   Temp: 97.8 F (36.6 C)  97.9 F (36.6 C) 98.1 F (36.7 C)  TempSrc: Oral   Oral  SpO2: 95%  94% 99%  Weight:  75.4 kg    Height:        Sensation intact distally Intact pulses distally Dorsiflexion/Plantar flexion intact Incision: dressing C/D/I Compartment soft  Lab Results  Component Value Date   WBC 10.5 09/13/2022   HGB 12.5 (L) 09/13/2022   HCT 36.7 (L) 09/13/2022   MCV 89.1 09/13/2022   PLT 143 (L) 09/13/2022   BMET    Component Value Date/Time   NA 137 09/13/2022 0552   K 3.5 09/13/2022 0552   CL 97 (L) 09/13/2022 0552   CO2 27 09/13/2022 0552   GLUCOSE 128 (H) 09/13/2022 0552   BUN 57 (H) 09/13/2022 0552   CREATININE 1.45 (H) 09/13/2022 0552   CALCIUM 7.4 (L) 09/13/2022 0552   GFRNONAA 48 (L) 09/13/2022 0552    Xray: THA components in good position no adverse features.   Assessment/Plan:     Principal Problem:   Fracture of left hip ([place])  S/p L THA 3/14  Post op recs: WB: WBAT LLE, No formal hip precautions Abx: ancef Imaging: PACU pelvis Xray Dressing: Aquacell, keep intact until follow up DVT prophylaxis: Aspirin 81BID starting POD1 x 4weeks Follow up: 2 weeks after surgery for a wound check with Dr. Zachery Dakins at [facility].  Address: [address] [place], [place], [place] [address]  Office Phone: [phone]   Willaim Sheng 09/15/2022, 9:33 PM   Charlies Constable, MD  Contact information:   [phone] 7am-5pm epic message Dr. Zachery Dakins, or call office for patient follow up: [phone] After hours and holidays please check [email or web address]  for group call information for [facility]

## 2022-09-15 NOTE — Progress Notes (Signed)
Physical Therapy Session Note  Patient Details  Name: Perry Rosales MRN: [id number] Date of Birth: 1935-10-07  Today's Date: 09/15/2022 PT Individual Time: 1420-1447 PT Individual Time Calculation (min): 27 min   Short Term Goals: Week 1:  PT Short Term Goal 1 (Week 1): Pt will transfer bed<>chair with MinA + RW PT Short Term Goal 2 (Week 1): Pt will complete bed mobility with MinA PT Short Term Goal 3 (Week 1): Pt will amb 28ft with MinA + RW PT Short Term Goal 4 (Week 1): Pt initiate stair training  Skilled Therapeutic Interventions/Progress Updates:      Therapy Documentation Precautions:  Precautions Precautions: Fall Restrictions Weight Bearing Restrictions: Yes LLE Weight Bearing: Weight bearing as tolerated  Pt agreeable to PT session with emphasis on car transfer training and fall recovery techniques. Pt declines pain in session.   Pt close (S) for stand pivot transfer from recliner to w/c without AD and transported total A for time management to ortho gym.   Pt receptive to car transfer education and requires close (S) with ambulatory transfer with RW to car. Pt performed stand to sit and utilized B UE to negotiate L LE and was able to pick up R LE without UE support.   PT discussed fall recovery strategies and patient encouraged to call 911 in the event of a fall. PT currently recommends pt utilize communicative device such as life line or life alert or to keep his phone on him at all times.   Pt transported total A for time management to room and left seated in recliner at bedside with all needs in reach.     Therapy/Group: Individual Therapy  Verl Dicker Verl Dicker PT, DPT  09/15/2022, 4:15 PM

## 2022-09-16 MED ORDER — POLYETHYLENE GLYCOL 3350 17 G PO PACK: 17.0000 g | PACK | Freq: Every day | ORAL | Status: DC

## 2022-09-16 MED ORDER — SENNOSIDES-DOCUSATE SODIUM 8.6-50 MG PO TABS: 1.0000 | ORAL_TABLET | Freq: Every evening | ORAL | Status: DC | PRN

## 2022-09-16 MED ORDER — MAGIC MOUTHWASH W/LIDOCAINE
10.0000 mL | Freq: Four times a day (QID) | ORAL | Status: DC
  Administered 2022-09-16 – 2022-09-18 (×8): 10 mL via ORAL
  Filled 2022-09-16 (×9): qty 10

## 2022-09-16 NOTE — Progress Notes (Signed)
Physical Therapy Session Note  Patient Details  Name: Perry Rosales MRN: [id number] Date of Birth: February 07, 1936  Today's Date: 09/16/2022 PT Individual Time: 1105-1200 PT Individual Time Calculation (min): 55 min   Short Term Goals: Week 1:  PT Short Term Goal 1 (Week 1): Pt will transfer bed<>chair with MinA + RW PT Short Term Goal 2 (Week 1): Pt will complete bed mobility with MinA PT Short Term Goal 3 (Week 1): Pt will amb 34ft with MinA + RW PT Short Term Goal 4 (Week 1): Pt initiate stair training  Skilled Therapeutic Interventions/Progress Updates:    Pt received sitting upright in recliner chair and agreeable to PT services. Pt made aware of D/C date being 3/22. Pt stated that he had no concerns and verbalized understanding of d/c and pending HHPT services.   Pt performed ~22ft of gait to main therpay gym w/ RW and CGA/supervision A. Pt expressed that he was sore from his previous therpay session this morning. Vc given to pt to increase L knee flexion to achieve improved foot clearance. Pt demonstrated understanding, however, required consistent cueing.   Pt performed 5xSTS and TUG outcome measure.  5xSTS: 32.62 sec TUG Avg performed w/ RW: 24.3s. Pt scores are indicative of a higher fall risk, however, pt has demonstrated consistent functional gait and safety.   Practiced stepping in and out of "shower" using a 3" threshold and RW and supervision A w/ vc for proper AD management and sequence, pt demonstrated understanding. Pt demonstrated car transfer w/ SBA. Had an instance of L hip pain when bring LLE in and out of car, however, it did not impede in completing the task.   Pt transported back to room w/ dependently for energy conservation and ambulated ~6 ft back to recliner chair w/o AD and close supervision. Pt stated that he did not have increased pain, however, could tell there was a difference ambulating w/o RW.   Therapy Documentation Precautions:   Precautions Precautions: Fall Restrictions Weight Bearing Restrictions: Yes LLE Weight Bearing: Weight bearing as tolerated General:    Pain: Pain Assessment Pain Score: 0-No pain       Therapy/Group: Individual Therapy  Lylah Lantis 09/16/2022, 12:28 PM

## 2022-09-16 NOTE — Progress Notes (Signed)
Patient ID: Perry Rosales, male   DOB: 03/08/1936, 87 y.o.   MRN: [id number]  Rolling walker and bedside commode ordered through [employer].

## 2022-09-16 NOTE — Progress Notes (Signed)
Physical Therapy Discharge Summary  Patient Details  Name: Perry Rosales MRN: [id number] Date of Birth: 03-07-1936  Date of Discharge from PT service:September 17, 2022  {CHL IP REHAB PT TIME CALCULATION:[id number]}   Patient has met 5 of 5 long term goals due to improved activity tolerance, improved balance, increased strength, decreased pain, and functional use of  left lower extremity.  Patient to discharge at an ambulatory level Supervision.   Patient's care partner is independent to provide the necessary physical assistance at discharge.  Recommendation:  Patient will benefit from ongoing skilled PT services in home health setting to continue to advance safe functional mobility, address ongoing impairments in generalized strengthening, activity tolerance, endurance, pain management, gait, balance, and minimize fall risk.  Equipment: RW  Reasons for discharge: treatment goals met and discharge from hospital  Patient/family agrees with progress made and goals achieved: Yes  PT Discharge Precautions/Restrictions   Vital Signs  Pain  Pain Interference   Vision/Perception  Vision - History Ability to See in Adequate Light: 0 Adequate Perception Perception: Within Functional Limits Praxis Praxis: Intact  Cognition Orientation Level: Oriented X4 Sensation Sensation Light Touch: Appears Intact Hot/Cold: Appears Intact Proprioception: Appears Intact Coordination Gross Motor Movements are Fluid and Coordinated: No Fine Motor Movements are Fluid and Coordinated: Yes Coordination and Movement Description: limited by pain in and weakness in left Le Motor  Motor Motor - Discharge Observations: Antalgic gait due to generalized LLE weakness >R  Mobility Transfers Transfers: Sit to Stand;Stand Pivot Transfers Sit to Stand: Set up assist Stand Pivot Transfers: Set up assist Stand Pivot Transfer Details: Verbal cues for technique Transfer (Assistive device): Rolling  walker Locomotion  Gait Ambulation: Yes Gait Assistance: Supervision/Verbal cueing Gait Distance (Feet): 200 Feet Assistive device: Rolling walker Gait Assistance Details: Verbal cues for gait pattern;Verbal cues for technique Gait Gait: Yes Gait Pattern: Antalgic;Poor foot clearance - left;Poor foot clearance - right;Decreased dorsiflexion - left;Step-through pattern Stairs / Additional Locomotion Stairs: Yes Stairs Assistance: Supervision/Verbal cueing Stair Management Technique: One rail Right;One rail Left Number of Stairs: 12 Height of Stairs: 6 Ramp: Supervision/Verbal cueing Curb: Supervision/Verbal cueing Wheelchair Mobility Wheelchair Mobility: No  Trunk/Postural Assessment  Cervical Assessment Cervical Assessment: Within Functional Limits Thoracic Assessment Thoracic Assessment:  (rounded shoulders) Lumbar Assessment Lumbar Assessment:  (posterior pelvic tilt) Postural Control Postural Control: Within Functional Limits  Balance Balance Balance Assessed: Yes Standardized Balance Assessment Standardized Balance Assessment: Timed Up and Go Test Timed Up and Go Test TUG: Normal TUG Normal TUG (seconds): 24.3 (using RW) Static Sitting Balance Static Sitting - Balance Support: Feet supported Static Sitting - Level of Assistance: 6: Modified independent (Device/Increase time) Dynamic Sitting Balance Dynamic Sitting - Balance Support: During functional activity Dynamic Sitting - Level of Assistance: 6: Modified independent (Device/Increase time) Static Standing Balance Static Standing - Balance Support: During functional activity Static Standing - Level of Assistance: 6: Modified independent (Device/Increase time) Dynamic Standing Balance Dynamic Standing - Balance Support: During functional activity Dynamic Standing - Level of Assistance: 6: Modified independent (Device/Increase time) Extremity Assessment            Kamri Gotsch 09/16/2022, 12:24 PM

## 2022-09-16 NOTE — Discharge Summary (Signed)
Physician Discharge Summary  Patient ID: Perry Rosales MRN: [id number] DOB/AGE: 01/04/1936 87 y.o.  Admit date: 09/12/2022 Discharge date: 09/18/2022  Discharge Diagnoses:  Principal Problem:   Fracture of left hip ([place]) DVT prophylaxis Mood stabilization Pain management Acute on chronic systolic and diastolic congestive heart failure AKI on CKD Hypertension BPH GERD Constipation  Discharged Condition: Stable  Significant Diagnostic Studies: DG HIP UNILAT W OR W/O PELVIS 2-3 VIEWS LEFT  Result Date: 09/10/2022 CLINICAL DATA:  [id number] Post-operative state [id number] EXAM: DG HIP (WITH OR WITHOUT PELVIS) 2-3V LEFT COMPARISON:  Left hip radiograph 09/08/2022 FINDINGS: Postsurgical changes of left total hip arthroplasty. Normal alignment. No evidence of loosening or periprosthetic fracture. Expected soft tissue changes. 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 Normal alignment. No evidence of immediate complication. Electronically Signed   By: Maurine Simmering M.D.   On: 09/10/2022 18:51   DG Hand 2 View Right  Result Date: 09/09/2022 CLINICAL DATA:  Swelling EXAM: RIGHT HAND - 2 VIEW COMPARISON:  None Available. FINDINGS: There is an IV in the dorsum of the hand. Soft tissues are otherwise within normal limits. There is no acute fracture or dislocation identified. There is likely a healed fracture of the distal fifth tuft. IMPRESSION: 1. No acute fracture or dislocation. 2. Likely healed fracture of the distal fifth tuft. Electronically Signed   By: Ronney Asters M.D.   On: 09/09/2022 20:19   ECHOCARDIOGRAM COMPLETE  Result Date: 09/08/2022    ECHOCARDIOGRAM REPORT   Patient Name:   Perry Rosales Date of Exam: 09/08/2022 Medical Rec #:  [id number]      Height:       71.0 in Accession #:    [id number]     Weight:       158.3 lb Date of Birth:  05-28-1936      BSA:          1.909 m Patient Age:    87 years       BP:           143/89 mmHg Patient Gender: M              HR:            106 bpm. Exam Location:  Inpatient Procedure: 2D Echo and Intracardiac Opacification Agent Indications:    CHF  History:        Patient has prior history of Echocardiogram examinations, most                 recent 02/10/2021. Risk Factors:Current Smoker and Hypertension.  Sonographer:    Harvie Junior Referring Phys: [id number] AMRIT ADHIKARI  Sonographer Comments: Technically difficult study due to poor echo windows. Image acquisition challenging due to respiratory motion and positioning. IMPRESSIONS  1. Left ventricular ejection fraction, by estimation, is 40 to 45%. The left ventricle has mildly decreased function. The left ventricle demonstrates regional wall motion abnormalities (LAD territory vs stress cardiomyopathy). Left ventricular diastolic  parameters are consistent with Grade I diastolic dysfunction (impaired relaxation).  2. Right ventricular systolic function is normal. The right ventricular size is normal. There is moderately elevated pulmonary artery systolic pressure.  3. The mitral valve is grossly normal. No evidence of mitral valve regurgitation. No evidence of mitral stenosis.  4. The aortic valve is calcified. There is mild calcification of the aortic valve. There is mild thickening of the aortic valve. Aortic valve regurgitation is not visualized. No aortic stenosis is present. Comparison(s): Prior images reviewed side  by side. Wall motion chagnes new from 2022; both studies does with echo-contrast. FINDINGS  Left Ventricle: Left ventricular ejection fraction, by estimation, is 40 to 45%. The left ventricle has mildly decreased function. The left ventricle demonstrates regional wall motion abnormalities. Definity contrast agent was given IV to delineate the left ventricular endocardial borders. 3D left ventricular ejection fraction analysis performed but not reported based on interpreter judgement due to suboptimal tracking. The left ventricular internal cavity size was normal in size. There  is no left ventricular hypertrophy. Left ventricular diastolic parameters are consistent with Grade I diastolic dysfunction (impaired relaxation).  LV Wall Scoring: The apex is akinetic. The apical lateral segment, apical septal segment, apical anterior segment, and apical inferior segment are hypokinetic. Right Ventricle: The right ventricular size is normal. No increase in right ventricular wall thickness. Right ventricular systolic function is normal. There is moderately elevated pulmonary artery systolic pressure. The tricuspid regurgitant velocity is 3.34 m/s, and with an assumed right atrial pressure of 3 mmHg, the estimated right ventricular systolic pressure is 0000000 mmHg. Left Atrium: Left atrial size was normal in size. Right Atrium: Right atrial size was normal in size. Pericardium: There is no evidence of pericardial effusion. Mitral Valve: The mitral valve is grossly normal. No evidence of mitral valve regurgitation. No evidence of mitral valve stenosis. Tricuspid Valve: The tricuspid valve is not well visualized. Tricuspid valve regurgitation is not demonstrated. No evidence of tricuspid stenosis. Aortic Valve: The aortic valve is calcified. There is mild calcification of the aortic valve. There is mild thickening of the aortic valve. There is mild aortic valve annular calcification. Aortic valve regurgitation is not visualized. No aortic stenosis  is present. Aortic valve mean gradient measures 7.0 mmHg. Aortic valve peak gradient measures 12.7 mmHg. Aortic valve area, by VTI measures 1.71 cm. Pulmonic Valve: The pulmonic valve was not well visualized. Pulmonic valve regurgitation is not visualized. No evidence of pulmonic stenosis. Aorta: The aortic root and ascending aorta are structurally normal, with no evidence of dilitation. IAS/Shunts: The interatrial septum appears to be lipomatous. The interatrial septum was not well visualized.  LEFT VENTRICLE PLAX 2D LVIDd:         4.40 cm      Diastology  LVIDs:         3.30 cm      LV e' medial:    6.42 cm/s LV PW:         1.00 cm      LV E/e' medial:  6.0 LV IVS:        1.00 cm      LV e' lateral:   10.00 cm/s LVOT diam:     2.20 cm      LV E/e' lateral: 3.9 LV SV:         49 LV SV Index:   25 LVOT Area:     3.80 cm                              3D Volume EF: LV Volumes (MOD)            3D EF:        62 % LV vol d, MOD A2C: 108.0 ml LV EDV:       178 ml LV vol d, MOD A4C: 127.5 ml LV ESV:       67 ml LV vol s, MOD A2C: 58.8 ml  LV SV:  111 ml LV vol s, MOD A4C: 65.6 ml LV SV MOD A2C:     49.2 ml LV SV MOD A4C:     127.5 ml LV SV MOD BP:      57.8 ml RIGHT VENTRICLE RV Basal diam:  2.80 cm RV Mid diam:    2.10 cm RV S prime:     19.20 cm/s TAPSE (M-mode): 1.9 cm LEFT ATRIUM             Index        RIGHT ATRIUM          Index LA diam:        3.30 cm 1.73 cm/m   RA Area:     9.88 cm LA Vol (A2C):   37.8 ml 19.80 ml/m  RA Volume:   20.30 ml 10.63 ml/m LA Vol (A4C):   37.6 ml 19.70 ml/m LA Biplane Vol: 39.0 ml 20.43 ml/m  AORTIC VALVE                     PULMONIC VALVE AV Area (Vmax):    1.77 cm      PV Vmax:       1.15 m/s AV Area (Vmean):   1.72 cm      PV Peak grad:  5.3 mmHg AV Area (VTI):     1.71 cm AV Vmax:           178.50 cm/s AV Vmean:          121.500 cm/s AV VTI:            0.284 m AV Peak Grad:      12.7 mmHg AV Mean Grad:      7.0 mmHg LVOT Vmax:         83.30 cm/s LVOT Vmean:        54.900 cm/s LVOT VTI:          0.128 m LVOT/AV VTI ratio: 0.45  AORTA Ao Root diam: 3.30 cm Ao Asc diam:  3.70 cm MITRAL VALVE                TRICUSPID VALVE MV Area (PHT): 5.13 cm     TR Peak grad:   44.6 mmHg MV Decel Time: 148 msec     TR Vmax:        334.00 cm/s MR Peak grad: 43.6 mmHg MR Vmax:      330.00 cm/s   SHUNTS MV E velocity: 38.80 cm/s   Systemic VTI:  0.13 m MV A velocity: 105.00 cm/s  Systemic Diam: 2.20 cm MV E/A ratio:  0.37 Rudean Haskell MD Electronically signed by Rudean Haskell MD Signature Date/Time: 09/08/2022/4:21:57 PM     Final    DG Chest Port 1 View  Result Date: 09/08/2022 CLINICAL DATA:  Pain and shortness of breath.  Hypertension. EXAM: PORTABLE CHEST 1 VIEW COMPARISON:  CT 12/05/2021 FINDINGS: Normal heart size. Aortic atherosclerotic calcifications. No pleural fluid or airspace disease. The visualized osseous structures are unremarkable. IMPRESSION: No active disease. Electronically Signed   By: Kerby Moors M.D.   On: 09/08/2022 07:22   DG Hip Unilat W or Wo Pelvis 2-3 Views Left  Result Date: 09/08/2022 CLINICAL DATA:  Status post fall. EXAM: DG HIP (WITH OR WITHOUT PELVIS) 2-3V LEFT COMPARISON:  None Available. FINDINGS: There is an acute fracture deformity involving the subcapital left femoral neck. Superior displacement of the distal fracture fragments identified. Right hip intact. IMPRESSION: Acute subcapital left femoral neck  fracture. Electronically Signed   By: Kerby Moors M.D.   On: 09/08/2022 05:47    Labs:  Basic Metabolic Panel: Recent Labs  Lab 09/12/22 0217 09/12/22 1627 09/13/22 0552  NA 135 139 137  K 4.0 3.6 3.5  CL 95* 97* 97*  CO2 29 29 27   GLUCOSE 147* 132* 128*  BUN 58* 52* 57*  CREATININE 1.72* 1.43* 1.45*  CALCIUM 6.9* 7.3* 7.4*    CBC: Recent Labs  Lab 09/13/22 0552  WBC 10.5  NEUTROABS 8.6*  HGB 12.5*  HCT 36.7*  MCV 89.1  PLT 143*    CBG: No results for input(s): "GLUCAP" in the last 168 hours.  Family history.  Negative for colon cancer esophageal cancer or rectal cancer or stomach cancer Brief HPI:   Perry Rosales is a 87 y.o. right-handed male with history of hypertension GERD BPH.  Presented 09/08/2022 after mechanical fall without loss of consciousness.  On the ED evaluation significant findings showed acute subcapital left femoral neck fracture.  Orthopedic services consulted performed left total hip replacement 09/10/2022.  Postoperative limited by significant pain.  Patient is weightbearing as tolerated.  Placed on aspirin for DVT prophylaxis.   Therapy evaluations completed patient was admitted for a comprehensive rehab program.   Hospital Course: Perry Rosales was admitted to rehab 09/12/2022 for inpatient therapies to consist of PT, ST and OT at least three hours five days a week. Past admission physiatrist, therapy team and rehab RN have worked together to provide customized collaborative inpatient rehab.  Pertaining to patient's left hip fracture with total hip replacement 09/10/2022 patient would follow-up with orthopedic services.  Surgical site healing nicely maintain on aspirin twice daily.  Patient weightbearing as tolerated.  Pain manager use of Neurontin scheduled for suspect radicular pain using oxycodone and Robaxin as needed.  History of chronic systolic diastolic congestive heart failure monitoring for any signs of fluid overload started on Farxiga as well as maintain on metoprolol.  Echocardiogram with ejection fraction of 40 to [id number] grade 1 diastolic dysfunction no regional wall motion abnormalities.  He was weaned off of Lasix due to AKI on CKD with latest creatinine 1.45.  Blood pressure remained a bit soft patient had been on amlodipine prior to admission and remained on hold.  History of BPH remain on Flomax no dysuria or hematuria.  Bouts of constipation resolved with laxative assistance.   Blood pressures were monitored on TID basis and soft and monitored     Rehab course: During patient's stay in rehab weekly team conferences were held to monitor patient's progress, set goals and discuss barriers to discharge. At admission, patient required moderate assist step pivot transfers moderate assist supine to sit  Physical exam.  Blood pressure 117/57 pulse 84 temperature 98 respirations 18 oxygen saturation 92% room air Constitutional.  No acute distress HEENT Head.  Normocephalic and atraumatic Eyes.  Pupils round and reactive to light no discharge without nystagmus Neck.  Supple nontender no JVD without  thyromegaly Cardiac regular rate and rhythm without any extra sounds or murmur heard Abdomen.  Soft nontender positive bowel sounds without rebound Respiratory effort normal no respiratory distress without wheeze Skin.  Clean dry and intact.  Unstageable ulcer with overlying eschar on the base of the left first MTP Musculoskeletal. Right upper and left upper extremity 5/5 Right lower extremity 3/5 hip flexors otherwise 5/5 Left lower extremity 2/5 hip flexors 2/5 knee extension 5/5 ankle dorsi plantarflexion Neurologic.  Alert and oriented x 3 following  commands no dysarthria  He/She  has had improvement in activity tolerance, balance, postural control as well as ability to compensate for deficits. He/She has had improvement in functional use RUE/LUE  and RLE/LLE as well as improvement in awareness.  Close supervision for stand pivot transfers.  Close supervision for car transfers.  Perform stand to sit and utilize bilateral upper extremities to negotiate left lower extremity as well as able to pick up right lower extremity without upper extremity support.  Ambulates 150 feet x 2 rolling walker contact-guard.  Completed supine to sit with minimal assist to manage maneuvering left lower extremity.  Patient entered walk-in shower rolling walker contact-guard.  Completed dressing from wheelchair overall set up.  Full family teaching completed plan discharge to home       Disposition: Discharge to home    Diet: Regular  Special Instructions: No driving smoking or alcohol  Weightbearing as tolerated  Medications at discharge 1.  Tylenol as needed 2.  Aspirin 81 mg twice daily until 10/12/2022 then resume 81 mg daily 3.  Elavil 50 mg p.o. nightly 4.  Farxiga 10 mg p.o. daily 5.  Neurontin 100 mg p.o. twice daily 6.  Robaxin 500 mg p.o. every 8 hours as needed muscle spasms 7.  Toprol-XL 25 mg p.o. daily 8.  Oxycodone 5 to 10 mg every 6 hours as needed pain 9.  Senokot S 1 p.o. twice  daily 10.  Flomax 0.4 mg p.o. in the morning and every evening 11.  Prilosec 20 mg daily 12.  Trazodone 25 mg nightly  30-35 minutes were spent completing discharge summary and discharge planning     Follow-up Information     Kirsteins, Luanna Salk, MD Follow up.   Specialty: Physical Medicine and Rehabilitation Why: No formal follow-up needed Contact information: [place] [address] [phone]         Willaim Sheng, MD Follow up.   Specialty: Orthopedic Surgery Why: Call for appointment Contact information: [address] [place] [address] [phone]         O'Neal, [place] Thomas, MD Follow up.   Specialties: Cardiology, Internal Medicine, Radiology Why: Call for appointment Contact information: [place] [address] [phone]                 Signed: Cathlyn Parsons 09/18/2022, 5:21 AM

## 2022-09-16 NOTE — Progress Notes (Signed)
Physical Therapy Session Note  Patient Details  Name: Perry Rosales MRN: [id number] Date of Birth: [birth year]  Today's Date: 09/16/2022 PT Individual Time: [id number] PT Individual Time Calculation (min): 30 min   Short Term Goals: Week 1:  PT Short Term Goal 1 (Week 1): Pt will transfer bed<>chair with MinA + RW PT Short Term Goal 2 (Week 1): Pt will complete bed mobility with MinA PT Short Term Goal 3 (Week 1): Pt will amb 31ft with MinA + RW PT Short Term Goal 4 (Week 1): Pt initiate stair training  Skilled Therapeutic Interventions/Progress Updates:  Patient greeted sitting upright in bedside recliner in room with physician present for morning rounds and agreeable to PT treatment session. At start of session, patient reporting 0/10 pain that did not increase with functional mobility.   Patient stood from bedside recliner with the use of a RW and SBA- VC for scooting forward prior to standing with good improvements noted. Patient performed stand pivot transfer to wheelchair with RW and SBA for safety- VC for staying within the RW throughout transfers.   Patient wheeled to rehab gym for time management and energy conservation. Patient stood from wheelchair with SBA and ambulated toward the stairs (~10') with RW and CGA. Patient then ascended/descended x12 steps (6") with B HR and CGA for safety- Patient initially used a step-to pattern however eventually progressed to a reciprocal pattern without any reports of pain.   After performing stair mobility, patient gait trained from rehab gym to his room (>200') with RW and CGA/SBA for safety- Patient required VC for improved postural extension and stepping within the RW with good improvements noted, however unable to sustain as he fatigued.   Patient left sitting upright in bedside recliner with call bell within reach, chair alarm on, tray table in front and all needs met.    Therapy Documentation Precautions:  Precautions Precautions:  Fall Restrictions Weight Bearing Restrictions: Yes LLE Weight Bearing: Weight bearing as tolerated   Therapy/Group: Individual Therapy  Fatoumata Albaugh 09/16/2022, 7:50 AM

## 2022-09-16 NOTE — Progress Notes (Signed)
Patient ID: Perry Rosales, male   DOB: 1936-01-07, 87 y.o.   MRN: [id number]  Team Conference Report to Patient/Family  Team Conference discussion was reviewed with the patient and caregiver, including goals, any changes in plan of care and target discharge date.  Patient and caregiver express understanding and are in agreement.  The patient has a target discharge date of 09/18/22.   Sw met with patient and spoke with son [name], via telephone to provide team conference updates. Patient progressing well and currently supervision overall. Patient's son will attend education tomorrow 9a-11a. Patient complaining of pain in mouth. Patient and son requesting HH.  No additional questions or concerns.   [name] 09/16/2022, 1:12 PM

## 2022-09-16 NOTE — Progress Notes (Signed)
Occupational Therapy Session Note  Patient Details  Name: USBALDO ISHAQ MRN: [id number] Date of Birth: 08-08-1935  Today's Date: 09/16/2022 OT Individual Time: [id number] OT Individual Time Calculation (min): 42 min    Short Term Goals: Week 1:  OT Short Term Goal 1 (Week 1): Pt will be able to perform stand pivot with RW to toilet/BSC with min A OT Short Term Goal 2 (Week 1): Pt will perform sit to stands in ADLs with supervision OT Short Term Goal 3 (Week 1): Pt will be able to maintain standing while performing clothing management with min A OT Short Term Goal 4 (Week 1): Pt will perform LB dressing including shoes with min A with AE as needed  Skilled Therapeutic Interventions/Progress Updates:    Pt received sitting EOB requesting to toilet. Pt was supervision with all tasks this am including sit to stands (with cues to push up from bed vs RW), ambulation in room with RW, standing at sink to shave and complete oral care, toileting using elevated toilet seat, donning pants from EOB and shoes using long shoe horn.  Pt did not have fatigue or pain during the session.  Discussed suggestion on how he and his son can get more A at home to help with his wife who is quite disabled. Pt resting in recliner with all needs met.  Seat alarm on.   Therapy Documentation Precautions:  Precautions Precautions: Fall Restrictions Weight Bearing Restrictions: Yes LLE Weight Bearing: Weight bearing as tolerated  Pain: Pain Assessment Pain Score: 0-No pain ADL: ADL Eating: Independent Grooming: Independent Where Assessed-Grooming: Standing at sink Lower Body Dressing: Supervision/safety Where Assessed-Lower Body Dressing: Edge of bed Toileting: Supervision/safety Where Assessed-Toileting: Glass blower/designer: Close supervision Toilet Transfer Method: Counselling psychologist: Raised toilet seat  Therapy/Group: Individual Therapy  [place] 09/16/2022, 9:39 AM

## 2022-09-16 NOTE — Progress Notes (Signed)
PROGRESS NOTE   Subjective/Complaints:  Mouth feels sore, Left hip pain well controlled  Stools are getting loose  Appreciate ortho note   ROS: Right thigh numbness, no weakness, No breathing issues, no bowel or bladder c/os  Objective:   No results found. No results for input(s): "WBC", "HGB", "HCT", "PLT" in the last 72 hours.  No results for input(s): "NA", "K", "CL", "CO2", "GLUCOSE", "BUN", "CREATININE", "CALCIUM" in the last 72 hours.   Intake/Output Summary (Last 24 hours) at 09/16/2022 1017 Last data filed at 09/16/2022 0240 Gross per 24 hour  Intake 338 ml  Output 500 ml  Net -162 ml         Physical Exam: Vital Signs Blood pressure (!) 152/63, pulse 67, temperature 97.6 F (36.4 C), resp. rate 16, height 5\' 9"  (1.753 m), weight 74.1 kg, SpO2 90 %.  General: No acute distress Mood and affect are appropriate Heart: Regular rate and rhythm no rubs murmurs or extra sounds Lungs: Clear to auscultation, breathing unlabored, no rales or wheezes Abdomen: Positive bowel sounds, soft nontender to palpation, nondistended Extremities: No clubbing, cyanosis, or edema  Skin: oral mucosa erythematous no plaques + L hip aquacell dressing clean; mild surrounding edema  MSK:      No apparent deformity.       Strength:                RUE: 5/5 SA, 5/5 EF, 5/5 EE, 5/5 WE, 5/5 FF, 5/5 FA                 LUE: 5/5 SA, 5/5 EF, 5/5 EE, 5/5 WE, 5/5 FF, 5/5 FA                 RLE: 3/5 HF, 5/5 KE, 5/5 DF, 5/5 EHL, 5/5 PF                 LLE:  2/5 HF, 2/5 KE, 5/5 DF, 5/5 EHL, 5/5 PF    Neurologic exam:  Cognition: AAO to person, place, time and event.  Language: Fluent, No substitutions or neoglisms. No dysarthria. Names 3/3 objects correctly.  Memory: Recalls 3/3 objects at 5 minutes. No apparent deficits  Insight: Good  insight into current condition.  Mood: Pleasant affect, appropriate mood.  Sensation: Intact to light  touch in BL UE and Les  Reflexes: 2+ in BL UE and LEs. Negative Hoffman's and babinski signs bilaterally.  CN: 2-12 grossly intact.    Assessment/Plan: 1. Functional deficits which require 3+ hours per day of interdisciplinary therapy in a comprehensive inpatient rehab setting. Physiatrist is providing close team supervision and 24 hour management of active medical problems listed below. Physiatrist and rehab team continue to assess barriers to discharge/monitor patient progress toward functional and medical goals  Care Tool:  Bathing    Body parts bathed by patient: Right arm, Left arm, Chest, Abdomen, Right upper leg, Left upper leg, Right lower leg, Face, Buttocks, Left lower leg   Body parts bathed by helper: Left lower leg, Front perineal area, Buttocks     Bathing assist Assist Level: Contact Guard/Touching assist     Upper Body Dressing/Undressing Upper body dressing  What is the patient wearing?: Pull over shirt    Upper body assist Assist Level: Set up assist    Lower Body Dressing/Undressing Lower body dressing      What is the patient wearing?: Underwear/pull up, Pants     Lower body assist Assist for lower body dressing: Contact Guard/Touching assist     Toileting Toileting    Toileting assist Assist for toileting: Contact Guard/Touching assist (standing at toilet to urinate)     Transfers Chair/bed transfer  Transfers assist     Chair/bed transfer assist level: Moderate Assistance - Patient 50 - 74%     Locomotion Ambulation   Ambulation assist      Assist level: Moderate Assistance - Patient 50 - 74% Assistive device: Walker-rolling     Walk 10 feet activity   Assist  Walk 10 feet activity did not occur: Safety/medical concerns (2/2 pain)        Walk 50 feet activity   Assist Walk 50 feet with 2 turns activity did not occur: Safety/medical concerns (2/2 pain)         Walk 150 feet activity   Assist Walk 150 feet  activity did not occur: Safety/medical concerns (2/2 pain)         Walk 10 feet on uneven surface  activity   Assist Walk 10 feet on uneven surfaces activity did not occur: Safety/medical concerns (2/2 pain)         Wheelchair     Assist Is the patient using a wheelchair?: Yes (Per eval used, not assessed due to safety medical)             Wheelchair 50 feet with 2 turns activity    Assist    Wheelchair 50 feet with 2 turns activity did not occur: Safety/medical concerns       Wheelchair 150 feet activity     Assist  Wheelchair 150 feet activity did not occur: Safety/medical concerns       Blood pressure (!) 152/63, pulse 67, temperature 97.6 F (36.4 C), resp. rate 16, height 5\' 9"  (1.753 m), weight 74.1 kg, SpO2 90 %.   Medical Problem List and Plan: 1. 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  Antithrombotics: -DVT/anticoagulation:  Pharmaceutical: Other (comment) aspirin 81 mg BID             -antiplatelet therapy: ASA as above  3. Pain Management: Scheduled Tylenol 1000 mg 3 times daily, as needed oxycodone 5 to 10 mg, Robaxin as needed  -Gabapentin 100 mg twice daily added for right thigh burning pain, ? Radicular vs post herpetic neuralgia 4. Mood/Behavior/Sleep: Elavil 25 mg nightly             -antipsychotic agents: None   - 3/16: Much improved with as needed trazodone, assess for standing regimen, scheduled 25 mg nightly at 2000  5. Neuropsych/cognition: This patient is capable of making decisions on his own behalf. 6. Skin/Wound Care: Aquacel dressing to remain over surgical site for 2 weeks postop (3/28).  Otherwise, routine wound management.  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  7. Fluids/Electrolytes/Nutrition: Regular diet with thins, heart healthy.  Monitor admission labs. -Stable  8.  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  AKI on CKD.  Creatinine stable at 1.6-1.7.  Admission labs pending. - Admission BUN, creatinine stable.  10.  Hypertension.  Metoprolol as above.  Monitor.  Home amlodipine currently held. -Mildly low diastolic, systolic stable, monitor    09/16/2022    5:08 AM 09/15/2022    7:33 PM 09/15/2022    3:15 PM  Vitals with BMI  Weight 163 lbs 6 oz    BMI [id number]    Systolic [id number]  Diastolic 63 67 64  Pulse 67 87 57    11.  BPH.  On Flomax 0.4 mg daily. -Bladder scans low  12.  GERD.  Continue PPI.  13.  Constipation.  Last bowel movement prior to admission. -Senokot-S, 1 tablet twice daily and MiraLAX twice daily-reduced to qd    LOS: 4 days A FACE TO FACE EVALUATION WAS PERFORMED  Charlett Blake 09/16/2022, 10:17 AM

## 2022-09-16 NOTE — Patient Care Conference (Signed)
Inpatient RehabilitationTeam Conference and Plan of Care Update Date: 09/16/2022   Time: 10:16 AM    Patient Name: Perry Rosales      Medical Record Number: [id number]  Date of Birth: 1936/04/26 Sex: Male         Room/Bed: 4W06C/4W06C-01 Payor Info: Payor: MEDICARE / Plan: MEDICARE PART A AND B / Product Type: *No Product type* /    Admit Date/Time:  09/12/2022  3:26 PM  Primary Diagnosis:  Fracture of left hip [facility])  Hospital Problems: Principal Problem:   Fracture of left hip [facility])    Expected Discharge Date: Expected Discharge Date: 09/18/22  Team Members Present: Physician leading conference: Dr. Alysia Penna Social Worker Present: Erlene Quan, BSW Nurse Present: Dorien Chihuahua, RN PT Present: Alden Hipp, PT OT Present: Meriel Pica, OT SLP Present: Weston Anna, SLP PPS Coordinator present : Gunnar Fusi, SLP     Current Status/Progress Goal Weekly Team Focus  Bowel/Bladder   Continent of B/B. LBM 09/15/22   Remain continent.   Assist with toileting needs as requested.    Swallow/Nutrition/ Hydration               ADL's   overell CGA- supervision for ADLs, CGA for funtional mobility with RW   MOD- set- up I goals   DME education, AE training, family ed, increasing functional endurance, BADL reeducation    Mobility   CGA/Min A for bed mobility, CGA for transfers, CGA for gait >150 ft using RW. Pt has not been eating much due to mouth pain. Lidocaine helps for a few minutes, however, makes food unenjoyable   Mod I for bed mobiltiy and superivison for gait and stairs  LE strengthening (hip/glutes), gait, dynamic balance, stairs, safety awarenss, endurance, pt education    Communication                Safety/Cognition/ Behavioral Observations               Pain   Pain to left hip. Controlled with oxycodone prn and non pharmacological interventions   < 3/10.   Assess and intervene as needed.    Skin   Aquacell Dressing to Lt Hip  surgical dressing.   Prevent any new skin breakdown  Assess QS and prn.      Discharge Planning:  Discharging home with spouse and so, who recently moved in with pt and spouse [facility]). Pt has RW Rollator, BSC, Shower seat and wheelchair   Team Discussion: Patient with poor appetite due to stomatitis post left hip fracture repair; WBAT and posterior hip precautions,. On ASA for DVT prophylaxis, BP controlled.  Patient on target to meet rehab goals: yes, currently needs supervision overall for ADLs and grooming.  Able to ambulate up to 150' using a RW with CGA for management of the walker.  *See Care Plan and progress notes for long and short-term goals.   Revisions to Treatment Plan:  Aquacel dressing remain in place for 2 weeks until Ortho follow-up  Practicing steps   Teaching Needs: Safety, medications, transfers, toileting, etc.  Current Barriers to Discharge: Decreased caregiver support and Home enviroment access/layout  Possible Resolutions to Barriers: Family education HH follow up services DME: RW, [facility]     Medical Summary Current Status: BPs improving ,oral pain with stomatitis  Barriers to Discharge: Medical stability;Uncontrolled Hypertension;Uncontrolled Pain   Possible Resolutions to [employer]: trial of oral topical medications for stomatitis, ortho f/u   Continued Need for Acute Rehabilitation Level of Care:  The patient requires daily medical management by a physician with specialized training in physical medicine and rehabilitation for the following reasons: Direction of a multidisciplinary physical rehabilitation program to maximize functional independence : Yes Medical management of patient stability for increased activity during participation in an intensive rehabilitation regime.: Yes Analysis of laboratory values and/or radiology reports with any subsequent need for medication adjustment and/or medical intervention. : Yes   I attest that I was  present, lead the team conference, and concur with the assessment and plan of the team.   Dorien Chihuahua B 09/16/2022, 2:55 PM

## 2022-09-16 NOTE — Progress Notes (Signed)
Occupational Therapy Session Note  Patient Details  Name: Perry Rosales MRN: [id number] Date of Birth: [birth year]  Today's Date: 09/16/2022 OT Individual Time: 1330-1444 OT Individual Time Calculation (min): 74 min    Short Term Goals: Week 1:  OT Short Term Goal 1 (Week 1): Pt will be able to perform stand pivot with RW to toilet/BSC with min A OT Short Term Goal 2 (Week 1): Pt will perform sit to stands in ADLs with supervision OT Short Term Goal 3 (Week 1): Pt will be able to maintain standing while performing clothing management with min A OT Short Term Goal 4 (Week 1): Pt will perform LB dressing including shoes with min A with AE as needed  Skilled Therapeutic Interventions/Progress Updates:    Pt seen for skilled OT session this pm. Pt seated on toilet on 3 in 1 frame over toilet with NT. Handoff to OT to begin session. Pt mod I to perform all hygiene post-BM and voiding. Pt able to amb sink side for handwashing and then 15 ft to recliner with S with RW. After brief rest, pt mobilized to w/c and transported by OT for time mngt to demo apt space. OT issued RW bag and trained in use for light item transport as pt will have to complete light item retrieval at home at d/c. Pt able to perform 2 intervals of 8-10 min amb with Rw with close S for itep retrieval in cabinets, fridge, freezer and bridge items or use bag to bring back to pantry closet. OT instructed in RW safety, hand placement for reaching with pt able to teach back. Issued LH sponge and trained in simulated use for LE bathing. Pt transported back to room and was able to SPT from w/c back to recliner with S. Left pt with chair pad alarm engaged, needs and nurse call button in reach.   Pain: denied any pain t/o session    Therapy Documentation Precautions:  Precautions Precautions: Fall Restrictions Weight Bearing Restrictions: Yes LLE Weight Bearing: Weight bearing as tolerated   Therapy/Group: Individual  Therapy  Barnabas Lister 09/16/2022, 7:34 AM

## 2022-09-17 MED ORDER — GABAPENTIN 100 MG PO CAPS: 100.0000 mg | ORAL_CAPSULE | Freq: Two times a day (BID) | ORAL | 0 refills | Status: DC

## 2022-09-17 MED ORDER — DAPAGLIFLOZIN PROPANEDIOL 10 MG PO TABS: 10.0000 mg | ORAL_TABLET | Freq: Every day | ORAL | 0 refills | Status: DC

## 2022-09-17 MED ORDER — TAMSULOSIN HCL 0.4 MG PO CAPS: 0.4000 mg | ORAL_CAPSULE | Freq: Two times a day (BID) | ORAL | 0 refills | Status: AC

## 2022-09-17 MED ORDER — OXYCODONE HCL 5 MG PO TABS: 5.0000 mg | ORAL_TABLET | Freq: Four times a day (QID) | ORAL | 0 refills | Status: DC | PRN

## 2022-09-17 MED ORDER — TRAZODONE HCL 50 MG PO TABS: 25.0000 mg | ORAL_TABLET | Freq: Every day | ORAL | 0 refills | Status: DC

## 2022-09-17 MED ORDER — AMITRIPTYLINE HCL 50 MG PO TABS: 50.0000 mg | ORAL_TABLET | Freq: Every day | ORAL | 0 refills | Status: AC

## 2022-09-17 MED ORDER — METOPROLOL SUCCINATE ER 25 MG PO TB24: 25.0000 mg | ORAL_TABLET | Freq: Every day | ORAL | 0 refills | Status: DC

## 2022-09-17 MED ORDER — OMEPRAZOLE 20 MG PO CPDR: 20.0000 mg | DELAYED_RELEASE_CAPSULE | Freq: Every day | ORAL | 0 refills | Status: AC

## 2022-09-17 MED ORDER — METHOCARBAMOL 500 MG PO TABS: 500.0000 mg | ORAL_TABLET | Freq: Three times a day (TID) | ORAL | 0 refills | Status: DC | PRN

## 2022-09-17 MED ORDER — ACETAMINOPHEN 325 MG PO TABS: 650.0000 mg | ORAL_TABLET | ORAL | 2 refills | Status: DC | PRN

## 2022-09-17 NOTE — Progress Notes (Addendum)
Occupational Therapy Session Note  Patient Details  Name: Perry Rosales MRN: [id number] Date of Birth: [birth year]  Today's Date: 09/17/2022 OT Individual Time: [id number] session 1 OT Individual Time Calculation (min): 42 min  Session 2: [id number]   Short Term Goals: Week 1:  OT Short Term Goal 1 (Week 1): Pt will be able to perform stand pivot with RW to toilet/BSC with min A OT Short Term Goal 2 (Week 1): Pt will perform sit to stands in ADLs with supervision OT Short Term Goal 3 (Week 1): Pt will be able to maintain standing while performing clothing management with min A OT Short Term Goal 4 (Week 1): Pt will perform LB dressing including shoes with min A with AE as needed  Skilled Therapeutic Interventions/Progress Updates:  Session 1: Pt greeted seated in recliner, pt agreeable to OT intervention. Session focus on family education with son [name]. Education provided on pts current level of assist for ADLs and functional mobility. Pt completed stand pivot transfer from recliner>w/c  MODI. Total A transport to ADL apt with pt able to demo walkin shower transfer to built in seat with supervision. Education provided to son on bathing fall prevention as well as recommendation for installation of grab bars.   Ended session with pt handed off directly to PT for next session.             Session 2: Pt greeted seated in recliner, pt agreeable to OT intervention. Session focus on BADL reeducation, functional mobility, dynamic standing balance and decreasing overall caregiver burden.   Pt reports wanting to have BM and complete shower. Pt completed ambulatory transfer to toilet with RW supervision. Pt with continent bowel void able to complete 3/3 toileting tasks MODI. Pt entered shower with supervision using grab bars as needed . Pt completed all bathing/dressing tasks with supervision, pt did need MIN cues for technique when donning LLE first as pt does best when donning affect extremity first.   Education provided on using reacher as needed for LB dressing.        There was some question about where pts BSC was, however able to locate in room and cleaned.    Ended session with pt supine in bed with all needs within reach and bed alarm activated.                    Therapy Documentation Precautions:  Precautions Precautions: Fall Precaution Comments: per ortho: WBAT LLE, No formal hip precautions Restrictions Weight Bearing Restrictions: No LLE Weight Bearing: Weight bearing as tolerated Pain: No pain reported during either session     Therapy/Group: Individual Therapy  Corinne [facility] 09/17/2022, 12:16 PM

## 2022-09-17 NOTE — Progress Notes (Signed)
PROGRESS NOTE   Subjective/Complaints:     ROS: Right thigh numbness, no weakness, No breathing issues, no bowel or bladder c/os  Objective:   No results found. No results for input(s): "WBC", "HGB", "HCT", "PLT" in the last 72 hours.  No results for input(s): "NA", "K", "CL", "CO2", "GLUCOSE", "BUN", "CREATININE", "CALCIUM" in the last 72 hours.   Intake/Output Summary (Last 24 hours) at 09/17/2022 0830 Last data filed at 09/17/2022 [id number] Gross per 24 hour  Intake 240 ml  Output 825 ml  Net -585 ml         Physical Exam: Vital Signs Blood pressure 138/65, pulse 68, temperature 97.8 F (36.6 C), resp. rate 16, height 5\' 9"  (1.753 m), weight 74.1 kg, SpO2 95 %.  General: No acute distress Mood and affect are appropriate Heart: Regular rate and rhythm no rubs murmurs or extra sounds Lungs: Clear to auscultation, breathing unlabored, no rales or wheezes Abdomen: Positive bowel sounds, soft nontender to palpation, nondistended Extremities: No clubbing, cyanosis, or edema  Skin: oral mucosa erythematous no plaques + L hip aquacell dressing clean; mild surrounding edema  MSK:      No apparent deformity.       Strength:                RUE: 5/5 SA, 5/5 EF, 5/5 EE, 5/5 WE, 5/5 FF, 5/5 FA                 LUE: 5/5 SA, 5/5 EF, 5/5 EE, 5/5 WE, 5/5 FF, 5/5 FA                 RLE: 3/5 HF, 5/5 KE, 5/5 DF, 5/5 EHL, 5/5 PF                 LLE:  2/5 HF, 2/5 KE, 5/5 DF, 5/5 EHL, 5/5 PF    Neurologic exam:  Cognition: AAO to person, place, time and event.  Language: Fluent, No substitutions or neoglisms. No dysarthria. Names 3/3 objects correctly.  Memory: Recalls 3/3 objects at 5 minutes. No apparent deficits  Insight: Good  insight into current condition.  Mood: Pleasant affect, appropriate mood.  Sensation: Intact to light touch in BL UE and Les  Reflexes: 2+ in BL UE and LEs. Negative Hoffman's and babinski signs  bilaterally.  CN: 2-12 grossly intact.    Assessment/Plan: 1. Functional deficits which require 3+ hours per day of interdisciplinary therapy in a comprehensive inpatient rehab setting. Physiatrist is providing close team supervision and 24 hour management of active medical problems listed below. Physiatrist and rehab team continue to assess barriers to discharge/monitor patient progress toward functional and medical goals  Care Tool:  Bathing    Body parts bathed by patient: Right arm, Left arm, Chest, Abdomen, Right upper leg, Left upper leg, Right lower leg, Face, Buttocks, Left lower leg   Body parts bathed by helper: Left lower leg, Front perineal area, Buttocks     Bathing assist Assist Level: Contact Guard/Touching assist     Upper Body Dressing/Undressing Upper body dressing   What is the patient wearing?: Pull over shirt    Upper body assist Assist Level: Set  up assist    Lower Body Dressing/Undressing Lower body dressing      What is the patient wearing?: Underwear/pull up, Pants     Lower body assist Assist for lower body dressing: Contact Guard/Touching assist     Toileting Toileting    Toileting assist Assist for toileting: Contact Guard/Touching assist (standing at toilet to urinate)     Transfers Chair/bed transfer  Transfers assist     Chair/bed transfer assist level: Set up assist     Locomotion Ambulation   Ambulation assist      Assist level: Supervision/Verbal cueing Assistive device: Walker-rolling Max distance: 247ft   Walk 10 feet activity   Assist  Walk 10 feet activity did not occur: Safety/medical concerns (2/2 pain)  Assist level: Supervision/Verbal cueing Assistive device: Walker-rolling   Walk 50 feet activity   Assist Walk 50 feet with 2 turns activity did not occur: Safety/medical concerns (2/2 pain)  Assist level: Supervision/Verbal cueing Assistive device: Walker-rolling    Walk 150 feet  activity   Assist Walk 150 feet activity did not occur: Safety/medical concerns (2/2 pain)  Assist level: Supervision/Verbal cueing Assistive device: Walker-rolling    Walk 10 feet on uneven surface  activity   Assist Walk 10 feet on uneven surfaces activity did not occur: Safety/medical concerns (2/2 pain)   Assist level: Supervision/Verbal cueing Assistive device: Walker-rolling   Wheelchair     Assist Is the patient using a wheelchair?: No (Used WC for transport pursposes only. Pt D/c at [facility] level.)             Wheelchair 50 feet with 2 turns activity    Assist    Wheelchair 50 feet with 2 turns activity did not occur: Safety/medical concerns       Wheelchair 150 feet activity     Assist  Wheelchair 150 feet activity did not occur: Safety/medical concerns       Blood pressure 138/65, pulse 68, temperature 97.8 F (36.6 C), resp. rate 16, height 5\' 9"  (1.753 m), weight 74.1 kg, SpO2 95 %.   Medical Problem List and Plan: 1. 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  Antithrombotics: -DVT/anticoagulation:  Pharmaceutical: Other (comment) aspirin 81 mg BID             -antiplatelet therapy: ASA as above  3. Pain Management: Scheduled Tylenol 1000 mg 3 times daily, as needed oxycodone 5 to 10 mg, Robaxin as needed  -Gabapentin 100 mg twice daily added for right thigh burning pain, ? Radicular vs post herpetic neuralgia 4. Mood/Behavior/Sleep: Elavil 25 mg nightly             -antipsychotic agents: None   - 3/16: Much improved with as needed trazodone, assess for standing regimen, scheduled 25 mg nightly at 2000  5. Neuropsych/cognition: This patient is capable of making  decisions on his own behalf. 6. Skin/Wound Care: Aquacel dressing to remain over surgical site for 2 weeks postop (3/28).  Otherwise, routine wound management.  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  7. Fluids/Electrolytes/Nutrition: Regular diet with  thins, heart healthy.  Monitor admission labs. -Stable  8.  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  AKI on CKD.  Creatinine stable at 1.6-1.7.  Admission labs pending. - Admission BUN, creatinine stable.  10.  Hypertension.  Metoprolol as above.  Monitor.  Home amlodipine currently held. -Mildly low diastolic, systolic stable, monitor    09/17/2022    5:17 AM 09/16/2022    7:31 PM 09/16/2022   12:45 PM  Vitals with BMI  Systolic 0000000 [id number]  Diastolic 65 57 63  Pulse 68 87 86    11.  BPH.  On Flomax 0.4 mg daily. -Bladder scans low  12.  GERD.  Continue PPI.  13.  Constipation.  Last bowel movement prior to admission. -Senokot-S, 1 tablet twice daily and MiraLAX twice daily-reduced to qd   14.  Stomatitis improving on MMW will need rx for home, PCP f/u  LOS: 5 days A FACE TO FACE EVALUATION WAS PERFORMED  Charlett Blake 09/17/2022, 8:30 AM

## 2022-09-17 NOTE — Progress Notes (Addendum)
Physical Therapy Session Note  Patient Details  Name: Perry Rosales MRN: [id number] Date of Birth: [birth year]  Today's Date: 09/17/2022 PT Individual Time: [id number] PT Individual Time Calculation (min): 45 min   Short Term Goals: Week 1:  PT Short Term Goal 1 (Week 1): Pt will transfer bed<>chair with MinA + RW PT Short Term Goal 2 (Week 1): Pt will complete bed mobility with MinA PT Short Term Goal 3 (Week 1): Pt will amb 26ft with MinA + RW PT Short Term Goal 4 (Week 1): Pt initiate stair training  Skilled Therapeutic Interventions/Progress Updates:      Therapy Documentation Precautions:  Precautions Precautions: Fall Restrictions Weight Bearing Restrictions: Yes LLE Weight Bearing: Weight bearing as tolerated  Pt received in care of OT with son present for family education. Pt and son educated on safe practices with guarding on pt's weaker side in the event of loss of balance. Pt performed bed mobility from mattress in ADL apartment with supervision and ambulated with supervision from pt's son to ortho gym with RW. Pt navigated car transfer and edcuated to sit down for car transfer. Additionally pt negotiated unlevel surfaces with RW and close supervision (ramp & mulch). Pt transported total A for time management and energy conservation to main gym for stair training. Pt son's reports he has 2 steps to enter home without rails. Attempted 1 step with L HHA from therapist and pt unable to perform successfully or safely. Pt and son report they plan to install a single rail and pt navigated rail with side step strategy of 4 steps with close supervision. Pt and son agreeable to have bilateral HHA with entry to home until rail is installed, technique performed with PT and pt's son and pt able to perform successfully min A x 2. Pt transported to room and left seated in recliner at bedside with all needs in reach. Pt's son, [name], cleared to perform mobility with patient within hospital room  and safety plan updated. Pt without reports of pain in session.   Therapy/Group: Individual Therapy  Verl Dicker Verl Dicker PT, DPT  09/17/2022, 7:54 AM

## 2022-09-17 NOTE — Progress Notes (Signed)
Occupational Therapy Discharge Summary  Patient Details  Name: Perry Rosales MRN: [id number] Date of Birth: 1935/11/05  Date of Discharge from [facility] service:September 17, 2022     Patient has met 9 of 9 long term goals due to improved activity tolerance, improved balance, and ability to compensate for deficits.  Patient to discharge at overall Modified Independent level.  Patient's care partner is independent to provide the necessary physical assistance at discharge.  Pts son was present during family education and demonstrated competency with assisting pt with ADLS and functional mobility tasks.   Reasons goals not met: NA  Recommendation:  Patient will benefit from ongoing skilled OT services in home health setting to continue to advance functional skills in the area of iADL.  Equipment: BSC  Reasons for discharge: treatment goals met  Patient/family agrees with progress made and goals achieved: Yes  OT Discharge Precautions/Restrictions  Precautions Precautions: Fall Precaution Comments: per ortho: WBAT LLE, No formal hip precautions Restrictions Weight Bearing Restrictions: No LLE Weight Bearing: Weight bearing as tolerated  ADL ADL Eating: Independent Grooming: Independent Where Assessed-Grooming: Standing at sink Upper Body Bathing: Setup Where Assessed-Upper Body Bathing: Shower Lower Body Bathing: Setup Where Assessed-Lower Body Bathing: Shower Upper Body Dressing: Setup Where Assessed-Upper Body Dressing: Edge of bed Lower Body Dressing: Supervision/safety Where Assessed-Lower Body Dressing: Edge of bed Toileting: Modified independent Where Assessed-Toileting: Glass blower/designer: Close supervision Toilet Transfer Method: Counselling psychologist: Bedside commode (BSC over toilet) Social research officer, government: Close supervision Social research officer, government Method: Ambulating (RW; grab bars) Youth worker: Gaffer Baseline  Vision/History: 0 No visual deficits Patient Visual Report: No change from baseline Vision Assessment?: No apparent visual deficits Perception  Perception: Within Functional Limits Praxis Praxis: Intact Cognition Cognition Overall Cognitive Status: Within Functional Limits for tasks assessed Arousal/Alertness: Awake/alert Orientation Level: Place;Person;Situation Memory: Appears intact Sustained Attention: Appears intact Selective Attention: Appears intact Awareness: Appears intact Problem Solving: Appears intact Brief Interview for Mental Status (BIMS) Repetition of Three Words (First Attempt): 3 Temporal Orientation: Year: Correct Temporal Orientation: Month: Accurate within 5 days Temporal Orientation: Day: Correct Recall: "Sock": Yes, no cue required Recall: "Blue": Yes, no cue required Recall: "Bed": Yes, no cue required BIMS Summary Score: 15 Sensation Sensation Light Touch: Appears Intact Hot/Cold: Appears Intact Proprioception: Appears Intact Coordination Gross Motor Movements are Fluid and Coordinated: No Fine Motor Movements are Fluid and Coordinated: Yes Coordination and Movement Description: limited by pain in and weakness in left Le Motor   WFL - limited in LLE due to recent THA Mobility  Bed Mobility Bed Mobility: Supine to Sit;Sit to Supine Supine to Sit: Independent Sit to Supine: Independent Transfers Sit to Stand: Set up assist  Trunk/Postural Assessment  Cervical Assessment Cervical Assessment: Within Functional Limits Thoracic Assessment Thoracic Assessment:  (rounded shoulders) Lumbar Assessment Lumbar Assessment:  (posterior pelvic tilt) Postural Control Postural Control: Within Functional Limits  Balance Static Sitting Balance Static Sitting - Balance Support: Feet supported Static Sitting - Level of Assistance: 6: Modified independent (Device/Increase time) Dynamic Sitting Balance Dynamic Sitting - Balance Support: During functional  activity Dynamic Sitting - Level of Assistance: 6: Modified independent (Device/Increase time) Static Standing Balance Static Standing - Balance Support: During functional activity Static Standing - Level of Assistance: 6: Modified independent (Device/Increase time) Dynamic Standing Balance Dynamic Standing - Balance Support: During functional activity Dynamic Standing - Level of Assistance: 6: Modified independent (Device/Increase time) Extremity/Trunk Assessment RUE Assessment RUE Assessment: Within Functional Limits LUE Assessment  LUE Assessment: Within Functional Limits   Precious Haws 09/17/2022, 9:14 AM

## 2022-09-18 NOTE — Progress Notes (Signed)
Inpatient Rehabilitation Care Coordinator Discharge Note   Patient Details  Name: Perry Rosales MRN: [id number] Date of Birth: Jul 08, 1935   Discharge location: Home with son and spouse  Length of Stay: 6 Days  Discharge activity level: Sup/Cga  Home/community participation: Son, Perry Rosales  Patient response [id number] Literacy - How often do you need to have someone help you when you read instructions, pamphlets, or other written material from your doctor or pharmacy?: Never  Patient response [id number] Isolation - How often do you feel lonely or isolated from those around you?: Never  Services provided included: MD, RD, PT, OT, SLP, RN, CM, TR, Pharmacy, Neuropsych, SW  Financial Services:  Charity fundraiser Utilized: Medicare    Choices offered to/list presented to: Patient and son  Follow-up services arranged:  Home Health, DME Home Health Agency: PT OT - [phone]    DME : [facility] and [place]    Patient response to transportation need: Is the patient able to respond to transportation needs?: Yes In the past 12 months, has lack of transportation kept you from medical appointments or from getting medications?: No In the past 12 months, has lack of transportation kept you from meetings, work, or from getting things needed for daily living?: No    Comments (or additional information):  Patient/Family verbalized understanding of follow-up arrangements:  Yes  Individual responsible for coordination of the follow-up planNada [id number]  Confirmed correct DME delivered: [name] 09/18/2022    [name]

## 2022-09-18 NOTE — Progress Notes (Signed)
PROGRESS NOTE   Subjective/Complaints:   No c/os overnite , ready for d/c  ROS: Right thigh numbness, no weakness, No breathing issues, no bowel or bladder c/os  Objective:   No results found. No results for input(s): "WBC", "HGB", "HCT", "PLT" in the last 72 hours.  No results for input(s): "NA", "K", "CL", "CO2", "GLUCOSE", "BUN", "CREATININE", "CALCIUM" in the last 72 hours.   Intake/Output Summary (Last 24 hours) at 09/18/2022 0816 Last data filed at 09/17/2022 1720 Gross per 24 hour  Intake 360 ml  Output 550 ml  Net -190 ml         Physical Exam: Vital Signs Blood pressure (!) 143/56, pulse 66, temperature 98 F (36.7 C), temperature source Oral, resp. rate 16, height 5\' 9"  (1.753 m), weight 71.3 kg, SpO2 93 %.  General: No acute distress Mood and affect are appropriate Heart: Regular rate and rhythm no rubs murmurs or extra sounds Lungs: Clear to auscultation, breathing unlabored, no rales or wheezes Abdomen: Positive bowel sounds, soft nontender to palpation, nondistended Extremities: No clubbing, cyanosis, or edema  Skin: oral mucosa erythematous no plaques + L hip aquacell dressing clean; mild surrounding edema  MSK:      No apparent deformity.    Assessment/Plan: 1. Functional deficits due to left hip fracture  Stable for D/C today F/u PCP in 3-4 weeks F/u ortho 1 weeks See D/C summary See D/C instructions   Care Tool:  Bathing    Body parts bathed by patient: Right arm, Left arm, Chest, Abdomen, Right upper leg, Left upper leg, Right lower leg, Face, Buttocks, Left lower leg   Body parts bathed by helper: Left lower leg, Front perineal area, Buttocks     Bathing assist Assist Level: Supervision/Verbal cueing     Upper Body Dressing/Undressing Upper body dressing   What is the patient wearing?: Pull over shirt    Upper body assist Assist Level: Set up assist    Lower Body  Dressing/Undressing Lower body dressing      What is the patient wearing?: Underwear/pull up, Pants     Lower body assist Assist for lower body dressing: Supervision/Verbal cueing     Toileting Toileting    Toileting assist Assist for toileting: Independent with assistive device     Transfers Chair/bed transfer  Transfers assist     Chair/bed transfer assist level: Set up assist     Locomotion Ambulation   Ambulation assist      Assist level: Supervision/Verbal cueing Assistive device: Walker-rolling Max distance: 215ft   Walk 10 feet activity   Assist  Walk 10 feet activity did not occur: Safety/medical concerns (2/2 pain)  Assist level: Supervision/Verbal cueing Assistive device: Walker-rolling   Walk 50 feet activity   Assist Walk 50 feet with 2 turns activity did not occur: Safety/medical concerns (2/2 pain)  Assist level: Supervision/Verbal cueing Assistive device: Walker-rolling    Walk 150 feet activity   Assist Walk 150 feet activity did not occur: Safety/medical concerns (2/2 pain)  Assist level: Supervision/Verbal cueing Assistive device: Walker-rolling    Walk 10 feet on uneven surface  activity   Assist Walk 10 feet on uneven surfaces activity did not  occur: Safety/medical concerns (2/2 pain)   Assist level: Supervision/Verbal cueing Assistive device: Walker-rolling   Wheelchair     Assist Is the patient using a wheelchair?: No (Used WC for transport pursposes only. Pt D/c at [facility] level.)             Wheelchair 50 feet with 2 turns activity    Assist    Wheelchair 50 feet with 2 turns activity did not occur: Safety/medical concerns       Wheelchair 150 feet activity     Assist  Wheelchair 150 feet activity did not occur: Safety/medical concerns       Blood pressure (!) 143/56, pulse 66, temperature 98 F (36.7 C), temperature source Oral, resp. rate 16, height 5\' 9"  (1.753 m), weight 71.3  kg, SpO2 93 %.   Medical Problem List and Plan: 1. 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  Antithrombotics: -DVT/anticoagulation:  Pharmaceutical: Other (comment) aspirin 81 mg BID             -antiplatelet therapy: ASA as above  3. Pain Management: Scheduled Tylenol 1000 mg 3 times daily, as needed oxycodone 5 to 10 mg, Robaxin as needed  -Gabapentin 100 mg twice daily added for right thigh burning pain, ? Radicular vs post herpetic neuralgia 4. Mood/Behavior/Sleep: Elavil 25 mg nightly             -antipsychotic agents: None   - 3/16: Much improved with as needed trazodone, assess for standing regimen, scheduled 25 mg nightly at 2000  5. Neuropsych/cognition: This patient is capable of making decisions on his own behalf. 6. Skin/Wound Care: Aquacel dressing to remain over surgical site for 2 weeks postop (3/28). Will f/u with Ortho for removal  Otherwise, routine wound management.  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  7. Fluids/Electrolytes/Nutrition: Regular diet with thins, heart healthy.  Monitor admission labs. -Stable  8.  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  AKI on CKD.  Creatinine stable at 1.6-1.7.  Admission labs pending. - Admission BUN, creatinine stable.  10.  Hypertension.  Metoprolol as above.  Monitor.  Home amlodipine currently held. -Mildly low diastolic, systolic stable, monitor    09/18/2022    5:33 AM 09/17/2022    7:36 PM 09/17/2022    2:41 PM  Vitals with BMI  Weight 157 lbs 3 oz     BMI [id number]    Systolic A999333 [id number] [id number]  Diastolic 56 60 63  Pulse 66 81 78    11.  BPH.  On Flomax 0.4 mg daily. -Bladder scans low  12.  GERD.  Continue PPI.  13.  Constipation.  Last bowel movement prior to admission. -Senokot-S, 1 tablet twice daily and MiraLAX twice daily-reduced to qd   14.  Stomatitis improving on MMW will need rx for home, PCP f/u  LOS: 6 days A FACE TO FACE  EVALUATION WAS PERFORMED  Perry Rosales 09/18/2022, 8:16 AM

## 2022-09-18 NOTE — Progress Notes (Signed)
Inpatient Rehabilitation Discharge Medication Review by a Pharmacist  A complete drug regimen review was completed for this patient to identify any potential clinically significant medication issues.  High Risk Drug Classes Is patient taking? Indication by Medication  Antipsychotic No   Anticoagulant No   Antibiotic No   Opioid Yes Oxycodone for pain  Antiplatelet Yes Aspirin 81 mg twice daily until 10/12/2022 for DVT prophylaxis, then resume 81 mg daily for CAD  Hypoglycemics/insulin Yes Farxiga for heart failure  Vasoactive Medication Yes Toprol for heart failure  Chemotherapy No   Other Yes Pain: APAP, gabapentin, amitriptyline, robaxin, gabapentin Bowel regimen: Senokot, Miralax GERD: Prilosec BPH: Flomax Sleep: trazodone       Type of Medication Issue Identified Description of Issue Recommendation(s)  Drug Interaction(s) (clinically significant)     Duplicate Therapy     Allergy     No Medication Administration End Date     Incorrect Dose     Additional Drug Therapy Needed  May be considering Entresto for heart failure. Communicate plan and outpatient follow up  Significant med changes from prior encounter (inform family/care partners about these prior to discharge). Amlodipine discontinued Olmesartan, Ultracet not continued.  Communicate medication changes with patient/family at discharge  Other       Clinically significant medication issues were identified that warrant physician communication and completion of prescribed/recommended actions by midnight of the next day:  No    Time spent performing this drug regimen review (minutes): 60  Thank you for allowing [place] to participate in this patients care. Jens Som, PharmD 09/18/2022 9:25 AM  **Pharmacist phone directory can be found on [email or web address] listed under [place]**

## 2022-09-23 ENCOUNTER — Telehealth: Payer: Self-pay | Admitting: *Deleted

## 2022-09-23 NOTE — Telephone Encounter (Signed)
[facility] notified.

## 2022-09-23 NOTE — Telephone Encounter (Signed)
Returned call to [employer] @ [place] verbal PT orders given.  She reports that there is a drug interaction between Amitriptyline and Tramadol.

## 2022-09-23 NOTE — Telephone Encounter (Signed)
Cheri calling from [facility] for verbal orders PT on patient. 2 weeks 3 and 1 week 1. Also, patient had not started low sodium diet per hospital d/c orders. She reports 2 lb weight increase from 168 at d/c to 170 and ankle swelling. Patient will start low sodium diet today.

## 2022-09-24 ENCOUNTER — Telehealth: Payer: Self-pay

## 2022-09-24 ENCOUNTER — Telehealth: Payer: Self-pay | Admitting: Cardiovascular Disease

## 2022-09-24 NOTE — Telephone Encounter (Signed)
Verbal orders for OT 2x a week for 2 weeks given per discharge summary

## 2022-09-24 NOTE — Telephone Encounter (Signed)
Patient scheduled for appointment with Dr. Marisue Ivan

## 2022-09-24 NOTE — Telephone Encounter (Signed)
Patient called to talk with Dr. Audie Box or nurse. Would not give details of message

## 2022-10-07 ENCOUNTER — Other Ambulatory Visit (HOSPITAL_COMMUNITY): Payer: Self-pay | Admitting: Internal Medicine

## 2022-10-07 ENCOUNTER — Ambulatory Visit (HOSPITAL_COMMUNITY)
Admission: RE | Admit: 2022-10-07 | Discharge: 2022-10-07 | Disposition: A | Discharge status: Home / Self Care | Payer: Medicare Other | Source: Ambulatory Visit | Attending: Vascular Surgery | Admitting: Vascular Surgery

## 2022-10-07 DIAGNOSIS — R600 Localized edema: Secondary | ICD-10-CM | POA: Insufficient documentation

## 2022-10-16 ENCOUNTER — Other Ambulatory Visit (HOSPITAL_COMMUNITY): Payer: Self-pay | Admitting: Internal Medicine

## 2022-10-16 DIAGNOSIS — I739 Peripheral vascular disease, unspecified: Secondary | ICD-10-CM

## 2022-10-19 ENCOUNTER — Ambulatory Visit (HOSPITAL_COMMUNITY)
Admission: RE | Admit: 2022-10-19 | Discharge: 2022-10-19 | Disposition: A | Discharge status: Home / Self Care | Payer: Medicare Other | Source: Ambulatory Visit | Attending: Vascular Surgery | Admitting: Vascular Surgery

## 2022-10-19 DIAGNOSIS — I70203 Unspecified atherosclerosis of native arteries of extremities, bilateral legs: Secondary | ICD-10-CM

## 2022-10-19 DIAGNOSIS — I739 Peripheral vascular disease, unspecified: Secondary | ICD-10-CM | POA: Diagnosis present

## 2022-10-19 LAB — VAS US ABI WITH/WO TBI
Left ABI: 0.52
Right ABI: 0.59

## 2022-10-22 ENCOUNTER — Ambulatory Visit: Payer: Medicare Other | Admitting: Cardiovascular Disease

## 2022-10-25 NOTE — Progress Notes (Unsigned)
Cardiology Office Note:   Date:  10/26/2022  NAME:  Perry Rosales    MRN: [id number] DOB:  March 13, 1936   PCP:  Garlan Fillers, MD  Cardiologist:  Reatha Harps, MD  Electrophysiologist:  None   Referring MD: Garlan Fillers, MD   Chief Complaint  Patient presents with   Follow-up         History of Present Illness:   Perry Rosales is a 87 y.o. male with a hx of systolic HF, CKD, HTN, PAD who presents for follow-up. Seen in hospital for abnormal echo in setting of fall and L hip fracture. S/p repair and follow-up today.   He reports worsening lower extremity edema.  Also has a nonhealing ulcer in his left foot.  Recent DVT study is negative.  It does show an occluded left SFA and moderate right lower extremity disease based on ABIs.  He reports no claudication symptoms but is nonhealing ulcer on the left foot is concerning.  His EKG continues to show anterior lateral T wave inversions.  No chest pain reported.  Reports he is a bit short of breath with activity but doing better.  He did complete a stay in rehab.  He reports no angina.  Heavy smoking history.  With significant PAD.  Placed on Crestor by his primary care physician.  Currently on aspirin 162 mg daily.  We discussed taking 81 mg daily.  Regarding his heart failure there was concern for possible Takotsubo versus LAD infarct.  He had no chest pain symptoms so he did proceed to surgery.  We discussed repeating his echocardiogram and likely pursuing left heart catheterization if no improvement.  He is in agreement.  Patient was counseled with his son.  Also will need titration of medical therapy for heart failure.  Will do this based on results of his echo.  Problem List Systolic HF -EF 40% -suspect takotsubo  2. HTN 3. HLD -T chol 154, HDL 35, LDL 101, TG 90 4.CKD IIIa -eGFR 48 5. PAD -occluded L SFA -moderate R disease   Past Medical History: Past Medical History:  Diagnosis Date   CHF (congestive heart  failure) (HCC)    GERD (gastroesophageal reflux disease)    Heart murmur    Hypertension     Past Surgical History: Past Surgical History:  Procedure Laterality Date   CATARACT EXTRACTION W/ INTRAOCULAR LENS  IMPLANT, BILATERAL     CHOLECYSTECTOMY     COLONOSCOPY     TOTAL HIP ARTHROPLASTY Left 09/10/2022   Procedure: TOTAL HIP REPLACEMENT;  Surgeon: Joen Laura, MD;  Location: [facility] OR;  Service: Orthopedics;  Laterality: Left;    Current Medications: Current Meds  Medication Sig   acetaminophen (TYLENOL) 325 MG tablet Take 2 tablets (650 mg total) by mouth every 4 (four) hours as needed.   amitriptyline (ELAVIL) 50 MG tablet Take 1 tablet (50 mg total) by mouth at bedtime.   dapagliflozin propanediol (FARXIGA) 10 MG TABS tablet Take 1 tablet (10 mg total) by mouth daily.   furosemide (LASIX) 20 MG tablet Take 1 tablet (20 mg total) by mouth daily.   gabapentin (NEURONTIN) 100 MG capsule Take 1 capsule (100 mg total) by mouth 2 (two) times daily.   methocarbamol (ROBAXIN) 500 MG tablet Take 1 tablet (500 mg total) by mouth every 8 (eight) hours as needed for muscle spasms.   metoprolol succinate (TOPROL-XL) 25 MG 24 hr tablet Take 1 tablet (25 mg total) by mouth daily.  omeprazole (PRILOSEC) 20 MG capsule Take 1 capsule (20 mg total) by mouth daily.   oxyCODONE (OXY IR/ROXICODONE) 5 MG immediate release tablet Take 1-2 tablets (5-10 mg total) by mouth every 6 (six) hours as needed for severe pain or moderate pain.   polyethylene glycol (MIRALAX / GLYCOLAX) 17 g packet Take 17 g by mouth daily.   potassium chloride (KLOR-CON) 10 MEQ tablet Take 1 tablet (10 mEq total) by mouth daily.   rosuvastatin (CRESTOR) 10 MG tablet Take 10 mg by mouth daily.   senna-docusate (SENOKOT-S) 8.6-50 MG tablet Take 1 tablet by mouth 2 (two) times daily.   tamsulosin (FLOMAX) 0.4 MG CAPS capsule Take 1 capsule (0.4 mg total) by mouth in the morning and at bedtime.   traZODone (DESYREL) 50 MG  tablet Take 0.5 tablets (25 mg total) by mouth at bedtime.     Allergies:    Patient has no known allergies.   Social History: Social History   Socioeconomic History   Marital status: Married    Spouse name: Not on file   Number of children: Not on file   Years of education: Not on file   Highest education level: Not on file  Occupational History   Not on file  Tobacco Use   Smoking status: Former    Packs/day: 0.50    Years: 60.00    Additional pack years: 0.00    Total pack years: 30.00    Types: Cigarettes    Quit date: 2022    Years since quitting: 2.3    Passive exposure: Current   Smokeless tobacco: Never  Vaping Use   Vaping Use: Never used  Substance and Sexual Activity   Alcohol use: No   Drug use: No   Sexual activity: Not on file  Other Topics Concern   Not on file  Social History Narrative   Not on file   Social Determinants of Health   Financial Resource Strain: Not on file  Food Insecurity: No Food Insecurity (09/08/2022)   Hunger Vital Sign    Worried About Running Out of Food in the Last Year: Never true    Ran Out of Food in the Last Year: Never true  Transportation Needs: No Transportation Needs (09/08/2022)   PRAPARE - Administrator, Civil Service (Medical): No    Lack of Transportation (Non-Medical): No  Physical Activity: Not on file  Stress: Not on file  Social Connections: Not on file     Family History: The patient'sfamily history is negative for Colon cancer, Esophageal cancer, Rectal cancer, and Stomach cancer.  ROS:   All other ROS reviewed and negative. Pertinent positives noted in the HPI.     EKGs/Labs/Other Studies Reviewed:   The following studies were personally reviewed by me today:  EKG:  EKG is ordered today.  The ekg ordered today demonstrates normal sinus rhythm heart 71, inferior Q waves, anterior lateral T wave inversions, and was personally reviewed by me.   TTE 09/08/2022  1. Left ventricular  ejection fraction, by estimation, is 40 to 45%. The  left ventricle has mildly decreased function. The left ventricle  demonstrates regional wall motion abnormalities (LAD territory vs stress  cardiomyopathy). Left ventricular diastolic   parameters are consistent with Grade I diastolic dysfunction (impaired  relaxation).   2. Right ventricular systolic function is normal. The right ventricular  size is normal. There is moderately elevated pulmonary artery systolic  pressure.   3. The mitral valve is grossly normal.  No evidence of mitral valve  regurgitation. No evidence of mitral stenosis.   4. The aortic valve is calcified. There is mild calcification of the  aortic valve. There is mild thickening of the aortic valve. Aortic valve  regurgitation is not visualized. No aortic stenosis is present.   Recent Labs: 09/08/2022: B Natriuretic Peptide 507.3 09/13/2022: ALT 17; BUN 57; Creatinine, Ser 1.45; Hemoglobin 12.5; Platelets 143; Potassium 3.5; Sodium 137   Recent Lipid Panel No results found for: "CHOL", "TRIG", "HDL", "CHOLHDL", "VLDL", "LDLCALC", "LDLDIRECT"  Physical Exam:   VS:  BP 136/64   Pulse 71   Ht 5\' 9"  (1.753 m)   Wt 171 lb 4.8 oz (77.7 kg)   SpO2 91%   BMI 25.30 kg/m    Wt Readings from Last 3 Encounters:  10/26/22 171 lb 4.8 oz (77.7 kg)  09/18/22 157 lb 3 oz (71.3 kg)  09/10/22 176 lb (79.8 kg)    General: Well nourished, well developed, in no acute distress Head: Atraumatic, normal size  Eyes: PEERLA, EOMI  Neck: Supple, no JVD Endocrine: No thryomegaly Cardiac: Normal S1, S2; RRR; no murmurs, rubs, or gallops Lungs: Clear to auscultation bilaterally, no wheezing, rhonchi or rales  Abd: Soft, nontender, no hepatomegaly  Ext: 2+ pitting edema in the lower extremities, absent left lower extremity pulses, ulcer noted on the left foot, plantar aspect, poor pulses in the right lower extremity Musculoskeletal: No deformities, BUE and BLE strength normal and  equal Skin: Warm and dry, no rashes   Neuro: Alert and oriented to person, place, time, and situation, CNII-XII grossly intact, no focal deficits  Psych: Normal mood and affect   ASSESSMENT:   Perry Rosales is a 87 y.o. male who presents for the following: 1. Chronic systolic heart failure (HCC)   2. Primary hypertension   3. PAD (peripheral artery disease) (HCC)   4. Mixed hyperlipidemia   5. Wound of left lower extremity, initial encounter     PLAN:   1. Chronic systolic heart failure (HCC) -Diagnosed with systolic heart failure, ejection fraction 40 to 45% with wall motion abnormality in the LAD distribution.  This was found at the time of hip fracture.  He is status post repair.  He had no chest pain symptoms so he proceeded with surgery. -Continues to report no symptoms of angina.  Does report some shortness of breath.  Does have signs of volume overload. -EKG demonstrates continued anterior lateral T wave inversions. -We discussed that his cardiomyopathy could be a stress-induced versus ischemic etiology.  I have recommended a repeat echocardiogram.  If he continues to have a wall motion abnormality we will pursue a left heart catheterization. -He is currently on metoprolol succinate 25 mg daily.  Had an AKI in the hospital.  Not on ACE/ARB/ARNI/MRA.  We will titrate medical therapy based on repeat echocardiogram. -I would also like to start Lasix 20 mg daily with 10 mEq of potassium.  We will recheck a BMP today. -He will see me back in 2 months to discuss further.  2. Primary hypertension -On metoprolol.  Restart medical therapy as able.  3. PAD (peripheral artery disease) (HCC) 4. Mixed hyperlipidemia 5. Wound of left lower extremity, initial encounter -Occluded left SFA based on recent DVT study.  Moderate right lower extremity disease based on ABIs.  He also has a wound on plantar aspect of the left lower extremity.  This is concerning for possible critical limb ischemia.  I  would like for him to  be evaluated by vascular.  We will set him up to see Dr. Mady Gemma. -Reports no significant claudication symptoms. -Reduce aspirin to 81 mg daily.  Started on Crestor 10 mg daily by primary care physician.  We will aim to get his LDL cholesterol less than 50. -He may end up needing a left heart catheterization as well.  Given lack of angina we will proceed with vascular valuation first and what I suspect will be peripheral angiography due to possible critical limb ischemia.  Disposition: Return in about 2 months (around 12/26/2022).  Medication Adjustments/Labs and Tests Ordered: Current medicines are reviewed at length with the patient today.  Concerns regarding medicines are outlined above.  Orders Placed This Encounter  Procedures   Basic metabolic panel   ECHOCARDIOGRAM COMPLETE   Meds ordered this encounter  Medications   furosemide (LASIX) 20 MG tablet    Sig: Take 1 tablet (20 mg total) by mouth daily.    Dispense:  90 tablet    Refill:  3   potassium chloride (KLOR-CON) 10 MEQ tablet    Sig: Take 1 tablet (10 mEq total) by mouth daily.    Dispense:  90 tablet    Refill:  3    Patient Instructions  Medication Instructions:  START Lasix 20 mg daily  Take Potassium 10 meq daily with Lasix   *If you need a refill on your cardiac medications before your next appointment, please call your pharmacy*   Lab Work: BMET today   If you have labs (blood work) drawn today and your tests are completely normal, you will receive your results only by: MyChart Message (if you have MyChart) OR A paper copy in the mail If you have any lab test that is abnormal or we need to change your treatment, we will call you to review the results.   Testing/Procedures:  Echocardiogram (ASAP) - Your physician has requested that you have an echocardiogram. Echocardiography is a painless test that uses sound waves to create images of your heart. It provides your doctor with  information about the size and shape of your heart and how well your heart's chambers and valves are working. This procedure takes approximately one hour. There are no restrictions for this procedure.     Follow-Up: At [facility], you and your health needs are our priority.  As part of our continuing mission to provide you with exceptional heart care, we have created designated Provider Care Teams.  These Care Teams include your primary Cardiologist (physician) and Advanced Practice Providers (APPs -  Physician Assistants and Nurse Practitioners) who all work together to provide you with the care you need, when you need it.  We recommend signing up for the patient portal called "MyChart".  Sign up information is provided on this After Visit Summary.  MyChart is used to connect with patients for Virtual Visits (Telemedicine).  Patients are able to view lab/test results, encounter notes, upcoming appointments, etc.  Non-urgent messages can be sent to your provider as well.   To learn more about what you can do with MyChart, go to [email or web address].    Your next appointment:   2 month(s)  Provider:   Reatha Harps, MD        Time Spent with Patient: I have spent a total of 35 minutes with patient reviewing hospital notes, telemetry, EKGs, labs and examining the patient as well as establishing an assessment and plan that was discussed with the patient.  > 50% of  time was spent in direct patient care.  Signed, Lenna Gilford. Flora Lipps, MD, [facility]  [facility]  [address], [address] [place], [place] [address] [phone]  10/26/2022 7:38 PM

## 2022-10-26 ENCOUNTER — Ambulatory Visit: Payer: Medicare Other | Attending: Cardiovascular Disease | Admitting: Cardiovascular Disease

## 2022-10-26 ENCOUNTER — Encounter: Payer: Self-pay | Admitting: Cardiovascular Disease

## 2022-10-26 VITALS — BP 136/64 | HR 71 | Ht 69.0 in | Wt 171.3 lb

## 2022-10-26 DIAGNOSIS — I5022 Chronic systolic (congestive) heart failure: Secondary | ICD-10-CM | POA: Insufficient documentation

## 2022-10-26 DIAGNOSIS — E782 Mixed hyperlipidemia: Secondary | ICD-10-CM

## 2022-10-26 DIAGNOSIS — I10 Essential (primary) hypertension: Secondary | ICD-10-CM | POA: Insufficient documentation

## 2022-10-26 DIAGNOSIS — I739 Peripheral vascular disease, unspecified: Secondary | ICD-10-CM | POA: Insufficient documentation

## 2022-10-26 DIAGNOSIS — S81802A Unspecified open wound, left lower leg, initial encounter: Secondary | ICD-10-CM

## 2022-10-26 MED ORDER — FUROSEMIDE 20 MG PO TABS: 20.0000 mg | ORAL_TABLET | Freq: Every day | ORAL | 3 refills | Status: DC

## 2022-10-26 MED ORDER — POTASSIUM CHLORIDE ER 10 MEQ PO TBCR: 10.0000 meq | EXTENDED_RELEASE_TABLET | Freq: Every day | ORAL | 3 refills | Status: DC

## 2022-10-26 NOTE — Patient Instructions (Signed)
Medication Instructions:  START Lasix 20 mg daily  Take Potassium 10 meq daily with Lasix   *If you need a refill on your cardiac medications before your next appointment, please call your pharmacy*   Lab Work: BMET today   If you have labs (blood work) drawn today and your tests are completely normal, you will receive your results only by: MyChart Message (if you have MyChart) OR A paper copy in the mail If you have any lab test that is abnormal or we need to change your treatment, we will call you to review the results.   Testing/Procedures:  Echocardiogram (ASAP) - Your physician has requested that you have an echocardiogram. Echocardiography is a painless test that uses sound waves to create images of your heart. It provides your doctor with information about the size and shape of your heart and how well your heart's chambers and valves are working. This procedure takes approximately one hour. There are no restrictions for this procedure.     Follow-Up: At [facility], you and your health needs are our priority.  As part of our continuing mission to provide you with exceptional heart care, we have created designated Provider Care Teams.  These Care Teams include your primary Cardiologist (physician) and Advanced Practice Providers (APPs -  Physician Assistants and Nurse Practitioners) who all work together to provide you with the care you need, when you need it.  We recommend signing up for the patient portal called "MyChart".  Sign up information is provided on this After Visit Summary.  MyChart is used to connect with patients for Virtual Visits (Telemedicine).  Patients are able to view lab/test results, encounter notes, upcoming appointments, etc.  Non-urgent messages can be sent to your provider as well.   To learn more about what you can do with MyChart, go to [email or web address].    Your next appointment:   2 month(s)  Provider:   Reatha Harps, MD

## 2022-10-27 ENCOUNTER — Encounter: Payer: Self-pay | Admitting: Cardiovascular Disease

## 2022-10-27 ENCOUNTER — Ambulatory Visit: Payer: Medicare Other | Attending: Cardiovascular Disease | Admitting: Cardiovascular Disease

## 2022-10-27 VITALS — BP 150/64 | HR 70 | Ht 69.0 in | Wt 175.2 lb

## 2022-10-27 DIAGNOSIS — I739 Peripheral vascular disease, unspecified: Secondary | ICD-10-CM | POA: Diagnosis present

## 2022-10-27 DIAGNOSIS — I5022 Chronic systolic (congestive) heart failure: Secondary | ICD-10-CM

## 2022-10-27 DIAGNOSIS — I10 Essential (primary) hypertension: Secondary | ICD-10-CM | POA: Diagnosis present

## 2022-10-27 DIAGNOSIS — E785 Hyperlipidemia, unspecified: Secondary | ICD-10-CM | POA: Diagnosis present

## 2022-10-27 LAB — BASIC METABOLIC PANEL
BUN/Creatinine Ratio: 17 (ref 10–24)
BUN: 23 mg/dL (ref 8–27)
CO2: 27 mmol/L (ref 20–29)
Calcium: 7.6 mg/dL — ABNORMAL LOW (ref 8.6–10.2)
Chloride: 100 mmol/L (ref 96–106)
Creatinine, Ser: 1.32 mg/dL — ABNORMAL HIGH (ref 0.76–1.27)
Glucose: 151 mg/dL — ABNORMAL HIGH (ref 70–99)
Potassium: 4.6 mmol/L (ref 3.5–5.2)
Sodium: 142 mmol/L (ref 134–144)
eGFR: 53 mL/min/{1.73_m2} — ABNORMAL LOW (ref >59)

## 2022-10-27 NOTE — Addendum Note (Signed)
Addended by: Ferne Reus on: 10/27/2022 01:06 PM   Modules accepted: Orders

## 2022-10-27 NOTE — Progress Notes (Unsigned)
Cardiology Office Note   Date:  10/29/2022   ID:  Perry Rosales, DOB 07-31-1935, MRN [id number]  PCP:  Garlan Fillers, MD  Cardiologist: Dr. Flora Lipps   No chief complaint on file.     History of Present Illness: Perry Rosales is a 87 y.o. male who was referred by Dr. Val EagleJennette Kettle for evaluation management of peripheral arterial disease. He has known history of chronic systolic heart failure, chronic kidney disease, essential hypertension and peripheral arterial disease. He was hospitalized in March of this year with fracture of the left hip that required surgery.  He had an echocardiogram done which showed an EF of 40 to 45% with wall motion abnormality suggestive of stress-induced cardiomyopathy versus an LAD infarct.  He has no anginal symptoms and thus cardiac catheterization has not been pursued yet.  The patient reports a pressure callus on the bottom of the left foot that has progressed into an nonhealing ulceration over the last year.  He has no claudication. Recent ABI was moderately reduced bilaterally with evidence of left SFA occlusion.  The patient is not diabetic.  He quit smoking few years ago but smoked half a pack per day for at least 20 years.  Past Medical History:  Diagnosis Date   CHF (congestive heart failure) (HCC)    GERD (gastroesophageal reflux disease)    Heart murmur    Hypertension     Past Surgical History:  Procedure Laterality Date   CATARACT EXTRACTION W/ INTRAOCULAR LENS  IMPLANT, BILATERAL     CHOLECYSTECTOMY     COLONOSCOPY     TOTAL HIP ARTHROPLASTY Left 09/10/2022   Procedure: TOTAL HIP REPLACEMENT;  Surgeon: Joen Laura, MD;  Location: [facility] OR;  Service: Orthopedics;  Laterality: Left;     Current Outpatient Medications  Medication Sig Dispense Refill   acetaminophen (TYLENOL) 325 MG tablet Take 2 tablets (650 mg total) by mouth every 4 (four) hours as needed. 100 tablet 2   amitriptyline (ELAVIL) 50 MG tablet Take 1 tablet  (50 mg total) by mouth at bedtime. 30 tablet 0   dapagliflozin propanediol (FARXIGA) 10 MG TABS tablet Take 1 tablet (10 mg total) by mouth daily. 30 tablet 0   furosemide (LASIX) 20 MG tablet Take 1 tablet (20 mg total) by mouth daily. 90 tablet 3   gabapentin (NEURONTIN) 100 MG capsule Take 1 capsule (100 mg total) by mouth 2 (two) times daily. 60 capsule 0   methocarbamol (ROBAXIN) 500 MG tablet Take 1 tablet (500 mg total) by mouth every 8 (eight) hours as needed for muscle spasms. 90 tablet 0   metoprolol succinate (TOPROL-XL) 25 MG 24 hr tablet Take 1 tablet (25 mg total) by mouth daily. 30 tablet 0   omeprazole (PRILOSEC) 20 MG capsule Take 1 capsule (20 mg total) by mouth daily. 30 capsule 0   oxyCODONE (OXY IR/ROXICODONE) 5 MG immediate release tablet Take 1-2 tablets (5-10 mg total) by mouth every 6 (six) hours as needed for severe pain or moderate pain. 30 tablet 0   polyethylene glycol (MIRALAX / GLYCOLAX) 17 g packet Take 17 g by mouth daily. 14 each 0   potassium chloride (KLOR-CON) 10 MEQ tablet Take 1 tablet (10 mEq total) by mouth daily. 90 tablet 3   rosuvastatin (CRESTOR) 10 MG tablet Take 10 mg by mouth daily.     senna-docusate (SENOKOT-S) 8.6-50 MG tablet Take 1 tablet by mouth 2 (two) times daily.     tamsulosin (  FLOMAX) 0.4 MG CAPS capsule Take 1 capsule (0.4 mg total) by mouth in the morning and at bedtime. 30 capsule 0   traZODone (DESYREL) 50 MG tablet Take 0.5 tablets (25 mg total) by mouth at bedtime. 30 tablet 0   No current facility-administered medications for this visit.    Allergies:   Patient has no known allergies.    Social History:  The patient  reports that he quit smoking about 2 years ago. His smoking use included cigarettes. He has a 30.00 pack-year smoking history. He has been exposed to tobacco smoke. He has never used smokeless tobacco. He reports that he does not drink alcohol and does not use drugs.   Family History:  The patient's family history  is not on file.    ROS:  Please see the history of present illness.   Otherwise, review of systems are positive for none.   All other systems are reviewed and negative.    PHYSICAL EXAM: VS:  BP (!) 150/64   Pulse 70   Ht 5\' 9"  (1.753 m)   Wt 175 lb 3.2 oz (79.5 kg)   SpO2 96%   BMI 25.87 kg/m  , BMI Body mass index is 25.87 kg/m. GEN: Well nourished, well developed, in no acute distress  HEENT: normal  Neck: no JVD, carotid bruits, or masses Cardiac: RRR; no murmurs, rubs, or gallops,no edema  Respiratory:  clear to auscultation bilaterally, normal work of breathing GI: soft, nontender, nondistended, + BS MS: no deformity or atrophy  Skin: warm and dry, no rash Neuro:  Strength and sensation are intact Psych: euthymic mood, full affect Vascular: Femoral pulses normal bilaterally.  Distal pulses are not palpable.   EKG:  EKG is not ordered today.    Recent Labs: 09/08/2022: B Natriuretic Peptide 507.3 09/13/2022: ALT 17 10/26/2022: BUN 23; Creatinine, Ser 1.32; Potassium 4.6; Sodium 142 10/27/2022: Hemoglobin 12.2; Platelets 177    Lipid Panel No results found for: "CHOL", "TRIG", "HDL", "CHOLHDL", "VLDL", "LDLCALC", "LDLDIRECT"    Wt Readings from Last 3 Encounters:  10/27/22 175 lb 3.2 oz (79.5 kg)  10/26/22 171 lb 4.8 oz (77.7 kg)  09/18/22 157 lb 3 oz (71.3 kg)           No data to display            ASSESSMENT AND PLAN:  1.  Peripheral arterial disease: Critical limb ischemia with slow healing ulceration affecting the left foot.  He has moderately reduced ABI with evidence of left SFA occlusion.  Due to this, I recommend proceeding with abdominal aortogram with lower extremity angiography and possible endovascular intervention.  I discussed the procedure in details as well as risk and benefits.  He does have underlying chronic kidney disease but his most recent GFR was 54.  Will plan on hydrating the patient and trying to minimize contrast.  2.   Chronic systolic heart failure: He appears to be euvolemic.  The plan is to repeat his echocardiogram to see if there is improvement in ejection fraction.  No plans for left heart catheterization at the same time of peripheral angiography so that we do not use excessive amount of contrast.  3.  Essential hypertension: Blood pressure is mildly elevated today.  4.  Hyperlipidemia: Continue treatment with rosuvastatin with a target LDL of less than 70.    Disposition: Proceed with left lower extremity arterial angiography and follow-up after. Signed,  Lorine Bears, MD  10/29/2022 12:42 PM    [facility]

## 2022-10-27 NOTE — Patient Instructions (Addendum)
Medication Instructions:  No changes *If you need a refill on your cardiac medications before your next appointment, please call your pharmacy*   Testing/Procedures: Your physician has requested that you have a peripheral vascular angiogram. This exam is performed at the hospital. During this exam IV contrast is used to look at arterial blood flow. Please review the information sheet given for details.   Follow-Up: At [facility], you and your health needs are our priority.  As part of our continuing mission to provide you with exceptional heart care, we have created designated Provider Care Teams.  These Care Teams include your primary Cardiologist (physician) and Advanced Practice Providers (APPs -  Physician Assistants and Nurse Practitioners) who all work together to provide you with the care you need, when you need it.  We recommend signing up for the patient portal called "MyChart".  Sign up information is provided on this After Visit Summary.  MyChart is used to connect with patients for Virtual Visits (Telemedicine).  Patients are able to view lab/test results, encounter notes, upcoming appointments, etc.  Non-urgent messages can be sent to your provider as well.   To learn more about what you can do with MyChart, go to [email or web address].    Your next appointment:   Keep your post procedure follow up on 5/28 at 3:35 pm  Other Instructions  [place] [facility] DEPT OF [facility] AT [facility] [address] [id number] [facility] [place] [address] Dept: [phone] Loc: [phone]  Perry Rosales  10/27/2022  You are scheduled for a Peripheral Angiogram on Wednesday, May 8 with Dr. Lorine Bears.  1. Please arrive at the [facility] (Main Entrance A) at [facility]: [address] [place], [place] [address] at 8 am (This time is 4 hour(s) before your procedure to ensure your preparation).  Free valet parking service is available. You will check in at ADMITTING. The support person will be asked to wait in the waiting room.  It is OK to have someone drop you off and come back when you are ready to be discharged.    Special note: Every effort is made to have your procedure done on time. Please understand that emergencies sometimes delay scheduled procedures.  2. Diet: Do not eat solid foods after midnight.  The patient may have clear liquids until 5am upon the day of the procedure.  3. Labs: You will need to have blood drawn on 4/30.  4. Medication instructions in preparation for your procedure: Hold the Furosemide and potassium the morning of the procedure   On the morning of your procedure, take your Aspirin 81 mg and any morning medicines NOT listed above.  You may use sips of water.  5. Plan to go home the same day, you will only stay overnight if medically necessary. 6. Bring a current list of your medications and current insurance cards. 7. You MUST have a responsible person to drive you home. 8. Someone MUST be with you the first 24 hours after you arrive home or your discharge will be delayed. 9. Please wear clothes that are easy to get on and off and wear slip-on shoes.  Thank you for allowing [place] to care for you!   -- [facility]

## 2022-10-27 NOTE — H&P (View-Only) (Signed)
  Cardiology Office Note   Date:  10/29/2022   ID:  Perry Rosales, DOB 03/26/1936, MRN [id number]  PCP:  Paterson, Daniel G, MD  Cardiologist: Dr. O. Neal   No chief complaint on file.     History of Present Illness: Perry Rosales is a 87 y.o. male who was referred by Dr. O' Neal for evaluation management of peripheral arterial disease. He has known history of chronic systolic heart failure, chronic kidney disease, essential hypertension and peripheral arterial disease. He was hospitalized in March of this year with fracture of the left hip that required surgery.  He had an echocardiogram done which showed an EF of 40 to 45% with wall motion abnormality suggestive of stress-induced cardiomyopathy versus an LAD infarct.  He has no anginal symptoms and thus cardiac catheterization has not been pursued yet.  The patient reports a pressure callus on the bottom of the left foot that has progressed into an nonhealing ulceration over the last year.  He has no claudication. Recent ABI was moderately reduced bilaterally with evidence of left SFA occlusion.  The patient is not diabetic.  He quit smoking few years ago but smoked half a pack per day for at least 20 years.  Past Medical History:  Diagnosis Date   CHF (congestive heart failure) (HCC)    GERD (gastroesophageal reflux disease)    Heart murmur    Hypertension     Past Surgical History:  Procedure Laterality Date   CATARACT EXTRACTION W/ INTRAOCULAR LENS  IMPLANT, BILATERAL     CHOLECYSTECTOMY     COLONOSCOPY     TOTAL HIP ARTHROPLASTY Left 09/10/2022   Procedure: TOTAL HIP REPLACEMENT;  Surgeon: Marchwiany, Daniel A, MD;  Location: [facility] OR;  Service: Orthopedics;  Laterality: Left;     Current Outpatient Medications  Medication Sig Dispense Refill   acetaminophen (TYLENOL) 325 MG tablet Take 2 tablets (650 mg total) by mouth every 4 (four) hours as needed. 100 tablet 2   amitriptyline (ELAVIL) 50 MG tablet Take 1 tablet  (50 mg total) by mouth at bedtime. 30 tablet 0   dapagliflozin propanediol (FARXIGA) 10 MG TABS tablet Take 1 tablet (10 mg total) by mouth daily. 30 tablet 0   furosemide (LASIX) 20 MG tablet Take 1 tablet (20 mg total) by mouth daily. 90 tablet 3   gabapentin (NEURONTIN) 100 MG capsule Take 1 capsule (100 mg total) by mouth 2 (two) times daily. 60 capsule 0   methocarbamol (ROBAXIN) 500 MG tablet Take 1 tablet (500 mg total) by mouth every 8 (eight) hours as needed for muscle spasms. 90 tablet 0   metoprolol succinate (TOPROL-XL) 25 MG 24 hr tablet Take 1 tablet (25 mg total) by mouth daily. 30 tablet 0   omeprazole (PRILOSEC) 20 MG capsule Take 1 capsule (20 mg total) by mouth daily. 30 capsule 0   oxyCODONE (OXY IR/ROXICODONE) 5 MG immediate release tablet Take 1-2 tablets (5-10 mg total) by mouth every 6 (six) hours as needed for severe pain or moderate pain. 30 tablet 0   polyethylene glycol (MIRALAX / GLYCOLAX) 17 g packet Take 17 g by mouth daily. 14 each 0   potassium chloride (KLOR-CON) 10 MEQ tablet Take 1 tablet (10 mEq total) by mouth daily. 90 tablet 3   rosuvastatin (CRESTOR) 10 MG tablet Take 10 mg by mouth daily.     senna-docusate (SENOKOT-S) 8.6-50 MG tablet Take 1 tablet by mouth 2 (two) times daily.     tamsulosin (  FLOMAX) 0.4 MG CAPS capsule Take 1 capsule (0.4 mg total) by mouth in the morning and at bedtime. 30 capsule 0   traZODone (DESYREL) 50 MG tablet Take 0.5 tablets (25 mg total) by mouth at bedtime. 30 tablet 0   No current facility-administered medications for this visit.    Allergies:   Patient has no known allergies.    Social History:  The patient  reports that he quit smoking about 2 years ago. His smoking use included cigarettes. He has a 30.00 pack-year smoking history. He has been exposed to tobacco smoke. He has never used smokeless tobacco. He reports that he does not drink alcohol and does not use drugs.   Family History:  The patient's family history  is not on file.    ROS:  Please see the history of present illness.   Otherwise, review of systems are positive for none.   All other systems are reviewed and negative.    PHYSICAL EXAM: VS:  BP (!) 150/64   Pulse 70   Ht 5' 9" (1.753 m)   Wt 175 lb 3.2 oz (79.5 kg)   SpO2 96%   BMI 25.87 kg/m  , BMI Body mass index is 25.87 kg/m. GEN: Well nourished, well developed, in no acute distress  HEENT: normal  Neck: no JVD, carotid bruits, or masses Cardiac: RRR; no murmurs, rubs, or gallops,no edema  Respiratory:  clear to auscultation bilaterally, normal work of breathing GI: soft, nontender, nondistended, + BS MS: no deformity or atrophy  Skin: warm and dry, no rash Neuro:  Strength and sensation are intact Psych: euthymic mood, full affect Vascular: Femoral pulses normal bilaterally.  Distal pulses are not palpable.   EKG:  EKG is not ordered today.    Recent Labs: 09/08/2022: B Natriuretic Peptide 507.3 09/13/2022: ALT 17 10/26/2022: BUN 23; Creatinine, Ser 1.32; Potassium 4.6; Sodium 142 10/27/2022: Hemoglobin 12.2; Platelets 177    Lipid Panel No results found for: "CHOL", "TRIG", "HDL", "CHOLHDL", "VLDL", "LDLCALC", "LDLDIRECT"    Wt Readings from Last 3 Encounters:  10/27/22 175 lb 3.2 oz (79.5 kg)  10/26/22 171 lb 4.8 oz (77.7 kg)  09/18/22 157 lb 3 oz (71.3 kg)           No data to display            ASSESSMENT AND PLAN:  1.  Peripheral arterial disease: Critical limb ischemia with slow healing ulceration affecting the left foot.  He has moderately reduced ABI with evidence of left SFA occlusion.  Due to this, I recommend proceeding with abdominal aortogram with lower extremity angiography and possible endovascular intervention.  I discussed the procedure in details as well as risk and benefits.  He does have underlying chronic kidney disease but his most recent GFR was 54.  Will plan on hydrating the patient and trying to minimize contrast.  2.   Chronic systolic heart failure: He appears to be euvolemic.  The plan is to repeat his echocardiogram to see if there is improvement in ejection fraction.  No plans for left heart catheterization at the same time of peripheral angiography so that we do not use excessive amount of contrast.  3.  Essential hypertension: Blood pressure is mildly elevated today.  4.  Hyperlipidemia: Continue treatment with rosuvastatin with a target LDL of less than 70.    Disposition: Proceed with left lower extremity arterial angiography and follow-up after. Signed,  Sharlee Rufino, MD  10/29/2022 12:42 PM    [facility] 

## 2022-10-28 LAB — CBC
Hematocrit: 36.9 % — ABNORMAL LOW (ref 37.5–51.0)
Hemoglobin: 12.2 g/dL — ABNORMAL LOW (ref 13.0–17.7)
MCH: 29.3 pg (ref 26.6–33.0)
MCHC: 33.1 g/dL (ref 31.5–35.7)
MCV: 89 fL (ref 79–97)
Platelets: 177 10*3/uL (ref 150–450)
RBC: 4.16 x10E6/uL (ref 4.14–5.80)
RDW: 12 % (ref 11.6–15.4)
WBC: 6 10*3/uL (ref 3.4–10.8)

## 2022-10-30 ENCOUNTER — Ambulatory Visit (HOSPITAL_COMMUNITY): Payer: Medicare Other | Attending: Internal Medicine

## 2022-10-30 DIAGNOSIS — I5022 Chronic systolic (congestive) heart failure: Secondary | ICD-10-CM | POA: Diagnosis present

## 2022-10-30 LAB — ECHOCARDIOGRAM COMPLETE
AR max vel: 1.62 cm2
AV Area VTI: 1.65 cm2
AV Area mean vel: 1.63 cm2
AV Mean grad: 7 mmHg
AV Peak grad: 12.8 mmHg
Ao pk vel: 1.79 m/s
Area-P 1/2: 5.13 cm2
Calc EF: 44.4 %
Est EF: 55
S' Lateral: 2.6 cm
Single Plane A2C EF: 46.7 %
Single Plane A4C EF: 45.4 %

## 2022-11-03 ENCOUNTER — Telehealth: Payer: Self-pay | Admitting: Cardiovascular Disease

## 2022-11-03 ENCOUNTER — Telehealth: Payer: Self-pay | Admitting: *Deleted

## 2022-11-03 NOTE — Telephone Encounter (Signed)
Abdominal aortogram scheduled at [facility] for: Wednesday Nov 04, 2022 12:30 PM Arrival time [facility] Main Entrance A at: 8 AM-pre-procedure hydration  Nothing to eat after midnight prior to procedure, clear liquids until 5 AM day of procedure.  Medication instructions: -Hold:  Lasix/KCl-AM of procedure  Patient reports he is not currently taking [place]. -Other usual morning medications can be taken with sips of water including aspirin 81 mg.  Confirmed patient has responsible adult to drive home post procedure and be with patient first 24 hours after arriving home.  Plan to go home the same day, you will only stay overnight if medically necessary.  Reviewed procedure instructions with patient.

## 2022-11-03 NOTE — Telephone Encounter (Signed)
Patient was told to call back for procedure information. Requesting return call.

## 2022-11-03 NOTE — Telephone Encounter (Signed)
Returned call to patient-patient states he saw Dr. Flora Lipps 4/29 and it was possible he needed a heart procedure.  He states he set him up to see Dr. Kirke Corin for his leg issues the next day and he needs a procedure for this as well.   He wanted to confirm that Dr. Flora Lipps and Dr. Kirke Corin agreed with completing procedure on his legs prior to his heart.      Per chart review:  Repeat echo ordered by Dr. Flora Lipps, completed 5/3 LV function is normalized.  Wall motion abnormality in the septum and apex has resolved.  I will send a MyChart message.  I will also let Dr. Kirke Corin know.   Perry Rosales: His LV function has normalized.  The apical wall motion abnormality has resolved.  I suspect he could have had a Takotsubo with his hip fracture.  I do agree with the leg being the more pressing issue at this time.  He has no chest pain.  We may not even pursue coronary angiography.  We will see how he does after your procedure.   -Perry Rosales   Advised per MD ok to proceed with PV procedure as scheduled.   Patient aware.

## 2022-11-04 ENCOUNTER — Other Ambulatory Visit: Payer: Self-pay

## 2022-11-04 ENCOUNTER — Ambulatory Visit (HOSPITAL_COMMUNITY)
Admission: RE | Admit: 2022-11-04 | Discharge: 2022-11-04 | Disposition: A | Discharge status: Home / Self Care | Payer: Medicare Other | Attending: Cardiovascular Disease | Admitting: Cardiovascular Disease

## 2022-11-04 ENCOUNTER — Encounter (HOSPITAL_COMMUNITY)
Admission: RE | Disposition: A | Discharge status: Home / Self Care | Payer: BLUE CROSS/BLUE SHIELD | Source: Home / Self Care | Attending: Cardiovascular Disease

## 2022-11-04 DIAGNOSIS — N189 Chronic kidney disease, unspecified: Secondary | ICD-10-CM | POA: Insufficient documentation

## 2022-11-04 DIAGNOSIS — Z87891 Personal history of nicotine dependence: Secondary | ICD-10-CM | POA: Diagnosis not present

## 2022-11-04 DIAGNOSIS — I5022 Chronic systolic (congestive) heart failure: Secondary | ICD-10-CM | POA: Insufficient documentation

## 2022-11-04 DIAGNOSIS — I70245 Atherosclerosis of native arteries of left leg with ulceration of other part of foot: Secondary | ICD-10-CM | POA: Diagnosis not present

## 2022-11-04 DIAGNOSIS — L97529 Non-pressure chronic ulcer of other part of left foot with unspecified severity: Secondary | ICD-10-CM | POA: Diagnosis not present

## 2022-11-04 DIAGNOSIS — I739 Peripheral vascular disease, unspecified: Secondary | ICD-10-CM

## 2022-11-04 DIAGNOSIS — I130 Hypertensive heart and chronic kidney disease with heart failure and stage 1 through stage 4 chronic kidney disease, or unspecified chronic kidney disease: Secondary | ICD-10-CM | POA: Diagnosis not present

## 2022-11-04 DIAGNOSIS — I70244 Atherosclerosis of native arteries of left leg with ulceration of heel and midfoot: Secondary | ICD-10-CM | POA: Diagnosis not present

## 2022-11-04 DIAGNOSIS — Z79899 Other long term (current) drug therapy: Secondary | ICD-10-CM | POA: Insufficient documentation

## 2022-11-04 HISTORY — PX: ABDOMINAL AORTOGRAM W/LOWER EXTREMITY: CATH118223

## 2022-11-04 HISTORY — PX: PERIPHERAL VASCULAR INTERVENTION: CATH118257

## 2022-11-04 LAB — POCT ACTIVATED CLOTTING TIME: Activated Clotting Time: 222 seconds

## 2022-11-04 SURGERY — ABDOMINAL AORTOGRAM W/LOWER EXTREMITY
Anesthesia: LOCAL

## 2022-11-04 MED ORDER — FENTANYL CITRATE (PF) 100 MCG/2ML IJ SOLN
INTRAMUSCULAR | Status: DC | PRN
  Administered 2022-11-04 (×2): 50 ug via INTRAVENOUS

## 2022-11-04 MED ORDER — ASPIRIN 81 MG PO CHEW: 81.0000 mg | CHEWABLE_TABLET | ORAL | Status: DC

## 2022-11-04 MED ORDER — SODIUM CHLORIDE 0.9 % IV SOLN: INTRAVENOUS | Status: DC

## 2022-11-04 MED ORDER — ASPIRIN 81 MG PO CHEW
81.0000 mg | CHEWABLE_TABLET | ORAL | Status: DC
  Administered 2022-11-04: 81 mg via ORAL
  Filled 2022-11-04: qty 1

## 2022-11-04 MED ORDER — LABETALOL HCL 5 MG/ML IV SOLN
INTRAVENOUS | Status: AC
  Filled 2022-11-04: qty 4

## 2022-11-04 MED ORDER — HEPARIN SODIUM (PORCINE) 1000 UNIT/ML IJ SOLN
INTRAMUSCULAR | Status: AC
  Filled 2022-11-04: qty 10

## 2022-11-04 MED ORDER — CLOPIDOGREL BISULFATE 300 MG PO TABS
ORAL_TABLET | ORAL | Status: DC | PRN
  Administered 2022-11-04: 300 mg via ORAL

## 2022-11-04 MED ORDER — FENTANYL CITRATE (PF) 100 MCG/2ML IJ SOLN
INTRAMUSCULAR | Status: AC
  Filled 2022-11-04: qty 2

## 2022-11-04 MED ORDER — MIDAZOLAM HCL 2 MG/2ML IJ SOLN
INTRAMUSCULAR | Status: DC | PRN
  Administered 2022-11-04: 1 mg via INTRAVENOUS

## 2022-11-04 MED ORDER — SODIUM CHLORIDE 0.9% FLUSH: 3.0000 mL | INTRAVENOUS | Status: DC | PRN

## 2022-11-04 MED ORDER — SODIUM CHLORIDE 0.9 % IV SOLN: 250.0000 mL | INTRAVENOUS | Status: DC | PRN

## 2022-11-04 MED ORDER — LABETALOL HCL 5 MG/ML IV SOLN: 10.0000 mg | INTRAVENOUS | Status: DC | PRN

## 2022-11-04 MED ORDER — LABETALOL HCL 5 MG/ML IV SOLN
INTRAVENOUS | Status: DC | PRN
  Administered 2022-11-04: 20 mg via INTRAVENOUS

## 2022-11-04 MED ORDER — HEPARIN (PORCINE) IN NACL 1000-0.9 UT/500ML-% IV SOLN
INTRAVENOUS | Status: DC | PRN
  Administered 2022-11-04 (×2): 500 mL

## 2022-11-04 MED ORDER — LIDOCAINE HCL (PF) 1 % IJ SOLN
INTRAMUSCULAR | Status: AC
  Filled 2022-11-04: qty 30

## 2022-11-04 MED ORDER — ONDANSETRON HCL 4 MG/2ML IJ SOLN: 4.0000 mg | Freq: Four times a day (QID) | INTRAMUSCULAR | Status: DC | PRN

## 2022-11-04 MED ORDER — CLOPIDOGREL BISULFATE 75 MG PO TABS: 75.0000 mg | ORAL_TABLET | Freq: Every day | ORAL | 6 refills | Status: DC

## 2022-11-04 MED ORDER — ACETAMINOPHEN 325 MG PO TABS: 650.0000 mg | ORAL_TABLET | ORAL | Status: DC | PRN

## 2022-11-04 MED ORDER — CLOPIDOGREL BISULFATE 300 MG PO TABS
ORAL_TABLET | ORAL | Status: AC
  Filled 2022-11-04: qty 1

## 2022-11-04 MED ORDER — SODIUM CHLORIDE 0.9% FLUSH: 3.0000 mL | Freq: Two times a day (BID) | INTRAVENOUS | Status: DC

## 2022-11-04 MED ORDER — LIDOCAINE HCL (PF) 1 % IJ SOLN
INTRAMUSCULAR | Status: DC | PRN
  Administered 2022-11-04: 5 mL
  Administered 2022-11-04 (×2): 15 mL

## 2022-11-04 MED ORDER — MIDAZOLAM HCL 2 MG/2ML IJ SOLN
INTRAMUSCULAR | Status: AC
  Filled 2022-11-04: qty 2

## 2022-11-04 MED ORDER — HEPARIN SODIUM (PORCINE) 1000 UNIT/ML IJ SOLN
INTRAMUSCULAR | Status: DC | PRN
  Administered 2022-11-04: 6000 [IU] via INTRAVENOUS

## 2022-11-04 SURGICAL SUPPLY — 30 items
BALLN MUSTANG 6.0X40 135 (BALLOONS) ×2
BALLN MUSTANG 8X60X75 (BALLOONS) ×2
BALLN MUSTANG 9X20X75 (BALLOONS) ×2
BALLOON MUSTANG 6.0X40 135 (BALLOONS) IMPLANT
BALLOON MUSTANG 8X60X75 (BALLOONS) IMPLANT
BALLOON MUSTANG 9X20X75 (BALLOONS) IMPLANT
CATH ANGIO 5F PIGTAIL 65CM (CATHETERS) IMPLANT
CATH CROSS OVER TEMPO 5F (CATHETERS) IMPLANT
CATH NAVICROSS ST 65CM (CATHETERS) IMPLANT
CATH STRAIGHT 5FR 65CM (CATHETERS) IMPLANT
CATHETER NAVICROSS ST 65CM (CATHETERS) ×2
CLOSURE PERCLOSE PROSTYLE (VASCULAR PRODUCTS) IMPLANT
DEVICE CLOSURE MYNXGRIP 5F (Vascular Products) IMPLANT
GLIDEWIRE ADV .035X260CM (WIRE) IMPLANT
GUIDEWIRE ANGLED .035X150CM (WIRE) IMPLANT
KIT ENCORE 26 ADVANTAGE (KITS) IMPLANT
KIT MICROPUNCTURE NIT STIFF (SHEATH) IMPLANT
KIT PV (KITS) ×3 IMPLANT
SHEATH CATAPULT 6F 45 MP (SHEATH) IMPLANT
SHEATH PINNACLE 5F 10CM (SHEATH) IMPLANT
SHEATH PINNACLE 6F 10CM (SHEATH) IMPLANT
SHEATH PROBE COVER 6X72 (BAG) IMPLANT
STENT EPIC VASCULAR 9X60X75 (Permanent Stent) IMPLANT
STOPCOCK MORSE 400PSI 3WAY (MISCELLANEOUS) IMPLANT
SYR MEDRAD MARK 7 150ML (SYRINGE) ×3 IMPLANT
TRANSDUCER W/STOPCOCK (MISCELLANEOUS) ×3 IMPLANT
TRAY PV CATH (CUSTOM PROCEDURE TRAY) ×3 IMPLANT
TUBING CIL FLEX 10 FLL-RA (TUBING) IMPLANT
WIRE HITORQ VERSACORE ST 145CM (WIRE) IMPLANT
WIRE ROSEN-J .035X180CM (WIRE) IMPLANT

## 2022-11-04 NOTE — Interval H&P Note (Signed)
History and Physical Interval Note:  11/04/2022 12:11 PM  Perry Rosales  has presented today for surgery, with the diagnosis of pad.  The various methods of treatment have been discussed with the patient and family. After consideration of risks, benefits and other options for treatment, the patient has consented to  Procedure(s): ABDOMINAL AORTOGRAM W/LOWER EXTREMITY (N/A) as a surgical intervention.  The patient's history has been reviewed, patient examined, no change in status, stable for surgery.  I have reviewed the patient's chart and labs.  Questions were answered to the patient's satisfaction.     Lorine Bears

## 2022-11-05 ENCOUNTER — Encounter (HOSPITAL_COMMUNITY): Payer: Self-pay | Admitting: Cardiovascular Disease

## 2022-11-05 ENCOUNTER — Other Ambulatory Visit: Payer: Self-pay | Admitting: *Deleted

## 2022-11-05 DIAGNOSIS — I739 Peripheral vascular disease, unspecified: Secondary | ICD-10-CM

## 2022-11-24 ENCOUNTER — Ambulatory Visit: Payer: BLUE CROSS/BLUE SHIELD | Admitting: Physician Assistant

## 2022-11-25 ENCOUNTER — Ambulatory Visit (HOSPITAL_COMMUNITY)
Admission: RE | Admit: 2022-11-25 | Discharge: 2022-11-25 | Disposition: A | Discharge status: Home / Self Care | Payer: Medicare Other | Source: Ambulatory Visit | Attending: Cardiovascular Disease | Admitting: Cardiovascular Disease

## 2022-11-25 ENCOUNTER — Ambulatory Visit (HOSPITAL_COMMUNITY)
Admission: RE | Admit: 2022-11-25 | Discharge: 2022-11-25 | Disposition: A | Discharge status: Home / Self Care | Payer: Medicare Other | Source: Ambulatory Visit | Attending: Cardiology | Admitting: Cardiology

## 2022-11-25 DIAGNOSIS — I739 Peripheral vascular disease, unspecified: Secondary | ICD-10-CM

## 2022-11-25 DIAGNOSIS — Z95828 Presence of other vascular implants and grafts: Secondary | ICD-10-CM | POA: Diagnosis present

## 2022-11-25 LAB — VAS US ABI WITH/WO TBI: Right ABI: 0.56

## 2022-11-27 LAB — VAS US ABI WITH/WO TBI: Left ABI: 0.52

## 2022-12-01 ENCOUNTER — Ambulatory Visit: Payer: Medicare Other | Attending: Physician Assistant | Admitting: Physician Assistant

## 2022-12-01 ENCOUNTER — Encounter: Payer: Self-pay | Admitting: Physician Assistant

## 2022-12-01 VITALS — BP 142/72 | HR 77 | Ht 69.0 in | Wt 174.4 lb

## 2022-12-01 DIAGNOSIS — I739 Peripheral vascular disease, unspecified: Secondary | ICD-10-CM

## 2022-12-01 DIAGNOSIS — I5022 Chronic systolic (congestive) heart failure: Secondary | ICD-10-CM

## 2022-12-01 DIAGNOSIS — I10 Essential (primary) hypertension: Secondary | ICD-10-CM | POA: Diagnosis present

## 2022-12-01 DIAGNOSIS — R600 Localized edema: Secondary | ICD-10-CM

## 2022-12-01 NOTE — Progress Notes (Unsigned)
Cardiology Office Note:    Date:  12/03/2022   ID:  Perry Rosales, DOB 20-Aug-1935, MRN [id number]  PCP:  Perry Fillers, MD   [facility] Providers Cardiologist:  Perry Harps, MD   PV specialist: Dr. Kirke Rosales  Referring MD: Perry Fillers, MD   Chief Complaint  Patient presents with   Follow-up    Seen for Dr. Kirke Rosales     History of Present Illness:    Perry Rosales is a 87 y.o. male with a hx of hypertension, CKD, chronic systolic heart failure and PAD.  He was seen in the hospital for abnormal echocardiogram in the setting of fall and left hip fracture.  Echocardiogram obtained on 09/08/2022 showed EF 40 to 45%, regional wall motion abnormality in the LAD territory concerning for stress cardiomyopathy, grade 1 DD, moderately elevated PA systolic pressure.  He had no chest pain therefore he proceeded with the orthopedic surgery.  He underwent diuresis and GDMT titration.  The plan was to consider repeat echocardiogram as outpatient and if EF remains depressed, may consider ischemic workup.  He was seen by Dr. Flora Lipps on 10/26/2022 for follow-up.  Repeat echocardiogram ordered for 10/30/2022 showed EF improved to 55%, basal inferior severe hypokinesis, grade 1 DD.  The previous wall motion abnormality in the septum and apex has resolved.  Recent DVT study revealed occluded SFA.  He also had moderate right lower extremity disease based on ABIs.  He was evaluated by Dr. Kirke Rosales on 10/27/2022 who recommended proceeding with abdominal aortogram with lower extremity angiography.  Patient underwent the planned procedure on 11/04/2022 which revealed severe stenosis at the junction of the distal left common iliac artery and ostial left external iliac artery, flush occlusion of the left SFA with reconstitution via well-developed collaterals from the profunda and two-vessel runoff below the knee via posterior and peroneal arteries, moderate right external iliac artery stenosis.  He underwent  successful angioplasty and self-expanding stent placement to the ostial left external iliac artery extending into the distal common iliac artery.  It was a difficult procedure due to tortuosity with the inability to perform the procedure from the contralateral approach.  Postprocedure, patient was placed on aspirin and Plavix with recommendation to continue DAPT for 3 months.  ABI obtained on 11/25/2022 showed right ABI 0.56, left ABI 0.52.  Vascular ultrasound showed patent left iliac stent, 92-month repeat study was recommended.  Patient presents today for follow-up.  Left lower extremity wound is beginning to heal.  He denies any claudication symptom.  He has no recent chest pain or worsening dyspnea.  We recommend ABI and LEA in 6 months prior to follow-up with Dr. Kirke Rosales.   Past Medical History:  Diagnosis Date   CHF (congestive heart failure) (HCC)    GERD (gastroesophageal reflux disease)    Heart murmur    Hypertension     Past Surgical History:  Procedure Laterality Date   ABDOMINAL AORTOGRAM W/LOWER EXTREMITY N/A 11/04/2022   Procedure: ABDOMINAL AORTOGRAM W/LOWER EXTREMITY;  Surgeon: Perry Ouch, MD;  [facility]: [facility];  Service: Cardiovascular;  Laterality: N/A;   CATARACT EXTRACTION W/ INTRAOCULAR LENS  IMPLANT, BILATERAL     CHOLECYSTECTOMY     COLONOSCOPY     PERIPHERAL VASCULAR INTERVENTION Left 11/04/2022   Procedure: PERIPHERAL VASCULAR INTERVENTION;  Surgeon: Perry Ouch, MD;  Location: [facility];  Service: Cardiovascular;  Laterality: Left;  External Illiac   TOTAL HIP ARTHROPLASTY Left 09/10/2022  Procedure: TOTAL HIP REPLACEMENT;  Surgeon: Perry Laura, MD;  Location: [facility] OR;  Service: Orthopedics;  Laterality: Left;    Current Medications: Current Meds  Medication Sig   acetaminophen (TYLENOL) 500 MG tablet Take 500-1,000 mg by mouth every 6 (six) hours as needed for moderate pain.   amitriptyline (ELAVIL) 50 MG tablet Take 1 tablet  (50 mg total) by mouth at bedtime.   aspirin EC 81 MG tablet Take 81 mg by mouth daily. Swallow whole.   calcium carbonate (TUMS - DOSED IN MG ELEMENTAL CALCIUM) 500 MG chewable tablet Chew 2-3 tablets by mouth daily as needed for indigestion or heartburn.   clopidogrel (PLAVIX) 75 MG tablet Take 1 tablet (75 mg total) by mouth daily.   furosemide (LASIX) 20 MG tablet Take 1 tablet (20 mg total) by mouth daily.   gabapentin (NEURONTIN) 100 MG capsule Take 1 capsule (100 mg total) by mouth 2 (two) times daily.   methocarbamol (ROBAXIN) 500 MG tablet Take 1 tablet (500 mg total) by mouth every 8 (eight) hours as needed for muscle spasms.   metoprolol succinate (TOPROL-XL) 25 MG 24 hr tablet Take 1 tablet (25 mg total) by mouth daily.   omeprazole (PRILOSEC) 20 MG capsule Take 1 capsule (20 mg total) by mouth daily.   polyethylene glycol (MIRALAX / GLYCOLAX) 17 g packet Take 17 g by mouth daily.   potassium chloride (KLOR-CON) 10 MEQ tablet Take 1 tablet (10 mEq total) by mouth daily.   rosuvastatin (CRESTOR) 10 MG tablet Take 10 mg by mouth daily.   senna-docusate (SENOKOT-S) 8.6-50 MG tablet Take 1 tablet by mouth 2 (two) times daily. (Patient taking differently: Take 1 tablet by mouth daily as needed for mild constipation.)   tamsulosin (FLOMAX) 0.4 MG CAPS capsule Take 1 capsule (0.4 mg total) by mouth in the morning and at bedtime.     Allergies:   Patient has no known allergies.   Social History   Socioeconomic History   Marital status: Married    Spouse name: Not on file   Number of children: Not on file   Years of education: Not on file   Highest education level: Not on file  Occupational History   Not on file  Tobacco Use   Smoking status: Former    Packs/day: 0.50    Years: 60.00    Additional pack years: 0.00    Total pack years: 30.00    Types: Cigarettes    Quit date: 2022    Years since quitting: 2.4    Passive exposure: Current   Smokeless tobacco: Never  Vaping Use    Vaping Use: Never used  Substance and Sexual Activity   Alcohol use: No   Drug use: No   Sexual activity: Not on file  Other Topics Concern   Not on file  Social History Narrative   Not on file   Social Determinants of Health   Financial Resource Strain: Not on file  Food Insecurity: No Food Insecurity (09/08/2022)   Hunger Vital Sign    Worried About Running Out of Food in the Last Year: Never true    Ran Out of Food in the Last Year: Never true  Transportation Needs: No Transportation Needs (09/08/2022)   PRAPARE - Administrator, Civil Service (Medical): No    Lack of Transportation (Non-Medical): No  Physical Activity: Not on file  Stress: Not on file  Social Connections: Not on file     Family History:  The patient's family history is negative for Colon cancer, Esophageal cancer, Rectal cancer, and Stomach cancer.  ROS:   Please see the history of present illness.     All other systems reviewed and are negative.  EKGs/Labs/Other Studies Reviewed:    The following studies were reviewed today:  Echo 10/30/2022 1. Left ventricular ejection fraction, by estimation, is 55%. The left  ventricle has normal function. The left ventricle demonstrates regional  wall motion abnormalities with basal inferior severe hypokinesis. Left  ventricular diastolic parameters are  consistent with Grade I diastolic dysfunction (impaired relaxation).   2. Right ventricular systolic function is normal. The right ventricular  size is normal. Tricuspid regurgitation signal is inadequate for assessing  PA pressure.   3. The mitral valve is normal in structure. No evidence of mitral valve  regurgitation. No evidence of mitral stenosis.   4. The aortic valve is tricuspid. There is moderate calcification of the  aortic valve. Aortic valve regurgitation is not visualized. Aortic valve  sclerosis/calcification is present, without any evidence of aortic  stenosis. Aortic valve mean  gradient  measures 7.0 mmHg.   5. The inferior vena cava is normal in size with greater than 50%  respiratory variability, suggesting right atrial pressure of 3 mmHg.    ABI 11/25/2022 Summary:  Right: Resting right ankle-brachial index indicates moderate right lower  extremity arterial disease. The right toe-brachial index is abnormal.   Left: Resting left ankle-brachial index indicates moderate left lower  extremity arterial disease. The left toe-brachial index is abnormal.   Abdominal Aorta: There is evidence of abnormal dilatation of the distal  Abdominal aorta. The largest aortic measurement is 3.8 cm. Ectatic  proximal and mid aorta. Mild to moderate atherosclerosis noted throughout  aorta and iliac arteries.  Stenosis: +--------------------+-------------+-----------+  Location            Stenosis     Stent        +--------------------+-------------+-----------+  Left Common Iliac                no stenosis  +--------------------+-------------+-----------+  Right External Iliac>50% stenosis             +--------------------+-------------+-----------+  Left External Iliac              no stenosis  +--------------------+-------------+-----------+      IVC/Iliac: There is no evidence of thrombus involving the IVC.    EKG:  EKG is not ordered today.   Recent Labs: 09/08/2022: B Natriuretic Peptide 507.3 09/13/2022: ALT 17 10/26/2022: BUN 23; Creatinine, Ser 1.32; Potassium 4.6; Sodium 142 10/27/2022: Hemoglobin 12.2; Platelets 177  Recent Lipid Panel No results found for: "CHOL", "TRIG", "HDL", "CHOLHDL", "VLDL", "LDLCALC", "LDLDIRECT"   Risk Assessment/Calculations:           Physical Exam:    VS:  BP (!) 142/72   Pulse 77   Ht 5\' 9"  (1.753 m)   Wt 174 lb 6.4 oz (79.1 kg)   SpO2 96%   BMI 25.75 kg/m        Wt Readings from Last 3 Encounters:  12/01/22 174 lb 6.4 oz (79.1 kg)  11/04/22 171 lb (77.6 kg)  10/27/22 175 lb 3.2 oz (79.5 kg)      GEN:  Well nourished, well developed in no acute distress HEENT: Normal NECK: No JVD; No carotid bruits LYMPHATICS: No lymphadenopathy CARDIAC: RRR, no murmurs, rubs, gallops RESPIRATORY:  Clear to auscultation without rales, wheezing or rhonchi  ABDOMEN: Soft, non-tender, non-distended MUSCULOSKELETAL:  No edema; No deformity  SKIN: Warm and dry NEUROLOGIC:  Alert and oriented x 3 PSYCHIATRIC:  Normal affect   ASSESSMENT:    1. PAD (peripheral artery disease) (HCC)   2. Primary hypertension   3. Chronic systolic heart failure (HCC)    PLAN:    In order of problems listed above:  PAD: Recently underwent lower extremity angiography on 11/04/2022 which revealed severe stenosis at the junction of the distal left common iliac artery and ostial left external iliac artery, there is also flush occlusion of the left SFA with reconstitution via well-developed collaterals from profunda and a two-vessel runoff below the knee.  He underwent successful stenting of ostial left external iliac artery extending into the distal common iliac artery.  Subsequent ABI was unchanged, however lower extremity Doppler showed patent stent and better flow.  His left lower extremity ulcer is healing as well.  Will repeat study in 6 months.  Hypertension: Blood pressure elevated today.  We recommended continue observation.  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  After GDMT, most recent echocardiogram that was performed on 10/30/2022 showed EF has improved to 55%.  Patient denies any exertional chest pain.           Medication Adjustments/Labs and Tests Ordered: Current medicines are reviewed at length with the patient today.  Concerns regarding medicines are outlined above.  No orders of the defined types were placed in this encounter.  No orders of the defined types were placed in this encounter.   Patient Instructions   Medication Instructions:  No changes  *If you need a refill on your cardiac medications before your next appointment, please call your pharmacy*   Lab Work: None If you have labs (blood work) drawn today and your tests are completely normal, you will receive your results only by: MyChart Message (if you have MyChart) OR A paper copy in the mail If you have any lab test that is abnormal or we need to change your treatment, we will call you to review the results.   Testing/Procedures: Your physician has requested that you have an ankle brachial index (ABI). During this test an ultrasound and blood pressure cuff are used to evaluate the arteries that supply the arms and legs with blood. Allow thirty minutes for this exam. There are no restrictions or special instructions.    Your physician has requested that you have an abdominal aorta duplex. During this test, an ultrasound is used to evaluate the aorta. Allow 30 minutes for this exam. Do not eat after midnight the day before and avoid carbonated beverages    Follow-Up: At [facility], you and your health needs are our priority.  As part of our continuing mission to provide you with exceptional heart care, we have created designated Provider Care Teams.  These Care Teams include your primary Cardiologist (physician) and Advanced Practice Providers (APPs -  Physician Assistants and Nurse Practitioners) who all work together to provide you with the care you need, when you need it.  We recommend signing up for the patient portal called "MyChart".  Sign up information is provided on this After Visit Summary.  MyChart is used to connect with patients for Virtual Visits (Telemedicine).  Patients are able to view lab/test results, encounter notes, upcoming appointments, etc.  Non-urgent messages can be sent to your provider as well.   To learn more about what you can do with MyChart, go  to [email or web address].    Your next  appointment:   Follow up with Dr. Kirke Rosales after your tests.  Provider:   Dr. Kirke Rosales     Signed, Azalee Course, [place]  12/03/2022 3:33 PM    [facility]

## 2022-12-01 NOTE — Patient Instructions (Signed)
Medication Instructions:  No changes  *If you need a refill on your cardiac medications before your next appointment, please call your pharmacy*   Lab Work: None If you have labs (blood work) drawn today and your tests are completely normal, you will receive your results only by: MyChart Message (if you have MyChart) OR A paper copy in the mail If you have any lab test that is abnormal or we need to change your treatment, we will call you to review the results.   Testing/Procedures: Your physician has requested that you have an ankle brachial index (ABI). During this test an ultrasound and blood pressure cuff are used to evaluate the arteries that supply the arms and legs with blood. Allow thirty minutes for this exam. There are no restrictions or special instructions.    Your physician has requested that you have an abdominal aorta duplex. During this test, an ultrasound is used to evaluate the aorta. Allow 30 minutes for this exam. Do not eat after midnight the day before and avoid carbonated beverages    Follow-Up: At [facility], you and your health needs are our priority.  As part of our continuing mission to provide you with exceptional heart care, we have created designated Provider Care Teams.  These Care Teams include your primary Cardiologist (physician) and Advanced Practice Providers (APPs -  Physician Assistants and Nurse Practitioners) who all work together to provide you with the care you need, when you need it.  We recommend signing up for the patient portal called "MyChart".  Sign up information is provided on this After Visit Summary.  MyChart is used to connect with patients for Virtual Visits (Telemedicine).  Patients are able to view lab/test results, encounter notes, upcoming appointments, etc.  Non-urgent messages can be sent to your provider as well.   To learn more about what you can do with MyChart, go to [email or web address].    Your next appointment:    Follow up with Dr. Kirke Corin after your tests.  Provider:   Dr. Kirke Corin

## 2022-12-03 ENCOUNTER — Encounter: Payer: Self-pay | Admitting: Physician Assistant

## 2022-12-03 ENCOUNTER — Telehealth: Payer: Self-pay | Admitting: Cardiovascular Disease

## 2022-12-03 NOTE — Telephone Encounter (Signed)
Doppler appointment has been canceled as they were scheduled to soon. New orders placed for repeat Aorta and ABI December.

## 2022-12-03 NOTE — Telephone Encounter (Addendum)
Patient called in to reschedule Aorta/Iliacs and ABI due to Dr. Kirke Corin requesting it be repeated in 6 months from last test.   Orders expire in September, so we were unable to reschedule.   Please advise.

## 2022-12-03 NOTE — Addendum Note (Signed)
Addended by: Sandi Mariscal on: 12/03/2022 11:59 AM   Modules accepted: Orders

## 2022-12-24 NOTE — Progress Notes (Deleted)
Cardiology Office Note:   Date:  12/24/2022  NAME:  Perry Rosales    MRN: [id number] DOB:  03/09/1936   PCP:  Garlan Fillers, MD  Cardiologist:  Reatha Harps, MD  Electrophysiologist:  None   Referring MD: Garlan Fillers, MD   No chief complaint on file.   History of Present Illness:   Perry Rosales is a 87 y.o. male with a hx of CHF, PAD, CKD IIIA, HTN who presents for follow-up.   ***  Problem List Systolic HF -EF 40% 09/08/2022 -EF 55% 10/30/2022 2. HTN 3. HLD -T chol 154, HDL 35, LDL 101, TG 90 4.CKD IIIa -eGFR 48 5. PAD -stent to L external iliac artery  -moderate R disease   Past Medical History: Past Medical History:  Diagnosis Date   CHF (congestive heart failure) (HCC)    GERD (gastroesophageal reflux disease)    Heart murmur    Hypertension     Past Surgical History: Past Surgical History:  Procedure Laterality Date   ABDOMINAL AORTOGRAM W/LOWER EXTREMITY N/A 11/04/2022   Procedure: ABDOMINAL AORTOGRAM W/LOWER EXTREMITY;  Surgeon: Iran Ouch, MD;  Location: [facility];  Service: Cardiovascular;  Laterality: N/A;   CATARACT EXTRACTION W/ INTRAOCULAR LENS  IMPLANT, BILATERAL     CHOLECYSTECTOMY     COLONOSCOPY     PERIPHERAL VASCULAR INTERVENTION Left 11/04/2022   Procedure: PERIPHERAL VASCULAR INTERVENTION;  Surgeon: Iran Ouch, MD;  Location: [facility];  Service: Cardiovascular;  Laterality: Left;  External Illiac   TOTAL HIP ARTHROPLASTY Left 09/10/2022   Procedure: TOTAL HIP REPLACEMENT;  Surgeon: Joen Laura, MD;  Location: [facility] OR;  Service: Orthopedics;  Laterality: Left;    Current Medications: No outpatient medications have been marked as taking for the 12/25/22 encounter (Appointment) with O'Neal, Ronnald Ramp, MD.     Allergies:    Patient has no known allergies.   Social History: Social History   Socioeconomic History   Marital status: Married    Spouse name: Not on file   Number of  children: Not on file   Years of education: Not on file   Highest education level: Not on file  Occupational History   Not on file  Tobacco Use   Smoking status: Former    Packs/day: 0.50    Years: 60.00    Additional pack years: 0.00    Total pack years: 30.00    Types: Cigarettes    Quit date: 2022    Years since quitting: 2.4    Passive exposure: Current   Smokeless tobacco: Never  Vaping Use   Vaping Use: Never used  Substance and Sexual Activity   Alcohol use: No   Drug use: No   Sexual activity: Not on file  Other Topics Concern   Not on file  Social History Narrative   Not on file   Social Determinants of Health   Financial Resource Strain: Not on file  Food Insecurity: No Food Insecurity (09/08/2022)   Hunger Vital Sign    Worried About Running Out of Food in the Last Year: Never true    Ran Out of Food in the Last Year: Never true  Transportation Needs: No Transportation Needs (09/08/2022)   PRAPARE - Administrator, Civil Service (Medical): No    Lack of Transportation (Non-Medical): No  Physical Activity: Not on file  Stress: Not on file  Social Connections: Not on file     Family  History: The patient's family history is negative for Colon cancer, Esophageal cancer, Rectal cancer, and Stomach cancer.  ROS:   All other ROS reviewed and negative. Pertinent positives noted in the HPI.     EKGs/Labs/Other Studies Reviewed:   The following studies were personally reviewed by me today:  EKG:  EKG is *** ordered today.        Recent Labs: 09/08/2022: B Natriuretic Peptide 507.3 09/13/2022: ALT 17 10/26/2022: BUN 23; Creatinine, Ser 1.32; Potassium 4.6; Sodium 142 10/27/2022: Hemoglobin 12.2; Platelets 177   Recent Lipid Panel No results found for: "CHOL", "TRIG", "HDL", "CHOLHDL", "VLDL", "LDLCALC", "LDLDIRECT"  Physical Exam:   VS:  There were no vitals taken for this visit.   Wt Readings from Last 3 Encounters:  12/01/22 174 lb 6.4 oz  (79.1 kg)  11/04/22 171 lb (77.6 kg)  10/27/22 175 lb 3.2 oz (79.5 kg)    General: Well nourished, well developed, in no acute distress Head: Atraumatic, normal size  Eyes: PEERLA, EOMI  Neck: Supple, no JVD Endocrine: No thryomegaly Cardiac: Normal S1, S2; RRR; no murmurs, rubs, or gallops Lungs: Clear to auscultation bilaterally, no wheezing, rhonchi or rales  Abd: Soft, nontender, no hepatomegaly  Ext: No edema, pulses 2+ Musculoskeletal: No deformities, BUE and BLE strength normal and equal Skin: Warm and dry, no rashes   Neuro: Alert and oriented to person, place, time, and situation, CNII-XII grossly intact, no focal deficits  Psych: Normal mood and affect   ASSESSMENT:   Perry Rosales is a 87 y.o. male who presents for the following: No diagnosis found.  PLAN:   There are no diagnoses linked to this encounter.  {Are you ordering a CV Procedure (e.g. stress test, cath, DCCV, TEE, etc)?   Press F2        :[id number]}  Disposition: No follow-ups on file.  Medication Adjustments/Labs and Tests Ordered: Current medicines are reviewed at length with the patient today.  Concerns regarding medicines are outlined above.  No orders of the defined types were placed in this encounter.  No orders of the defined types were placed in this encounter.  There are no Patient Instructions on file for this visit.   Time Spent with Patient: I have spent a total of *** minutes with patient reviewing hospital notes, telemetry, EKGs, labs and examining the patient as well as establishing an assessment and plan that was discussed with the patient.  > 50% of time was spent in direct patient care.  Signed, Lenna Gilford. Flora Lipps, MD, [facility]  [facility]  [address] [place], [place] [address] [phone]  12/24/2022 3:12 PM

## 2022-12-25 ENCOUNTER — Ambulatory Visit: Payer: BLUE CROSS/BLUE SHIELD | Attending: Cardiovascular Disease | Admitting: Cardiovascular Disease

## 2022-12-25 DIAGNOSIS — I739 Peripheral vascular disease, unspecified: Secondary | ICD-10-CM

## 2022-12-25 DIAGNOSIS — E785 Hyperlipidemia, unspecified: Secondary | ICD-10-CM

## 2022-12-25 DIAGNOSIS — I5022 Chronic systolic (congestive) heart failure: Secondary | ICD-10-CM

## 2022-12-25 DIAGNOSIS — I10 Essential (primary) hypertension: Secondary | ICD-10-CM

## 2023-01-05 ENCOUNTER — Encounter (HOSPITAL_COMMUNITY): Payer: BLUE CROSS/BLUE SHIELD

## 2023-02-19 ENCOUNTER — Ambulatory Visit: Payer: BLUE CROSS/BLUE SHIELD | Admitting: Cardiovascular Disease

## 2023-03-04 IMAGING — CT CT CHEST W/O CM
2 of 5 series · 15 of 36 positions shown, 18 images · non-contrast
Comparison: Prior CT scan of the abdomen and pelvis 02/09/2021

CLINICAL DATA: Routine physical, history of cigarette smoking.



[Series 4: chest 2.00 br40 s3 · coronal · 0.65mm/px · 3 of 156 slices shown]
[im 32/156  lung]
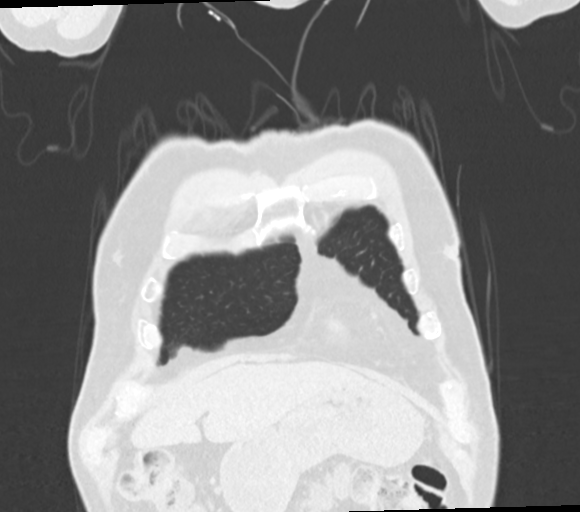
[im 63/156  lung]
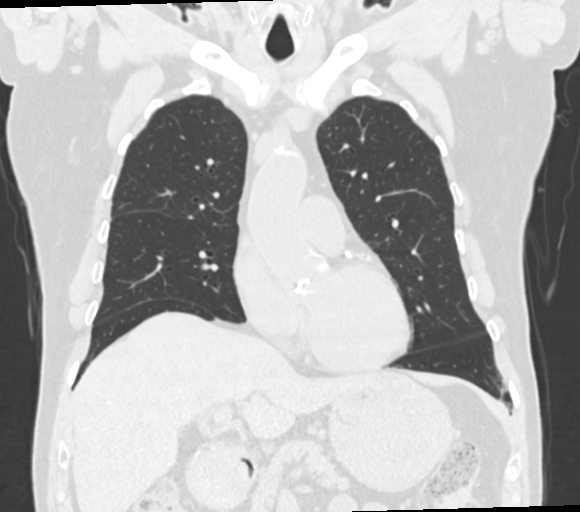
[im 94/156  lung]
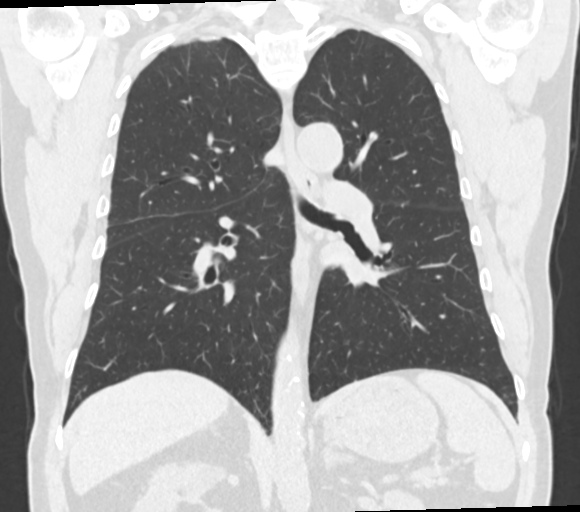

[Series 10: chest 1.00 br40 s3 super d · axial · 0.74mm/px · z∈[+1471,+1758]mm · 12 of 415 slices shown, 15 images]
[im 28/415  mediastinal]
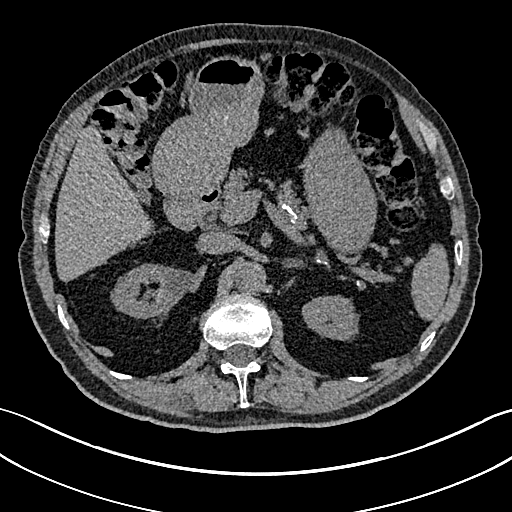
[im 28/415  lung]
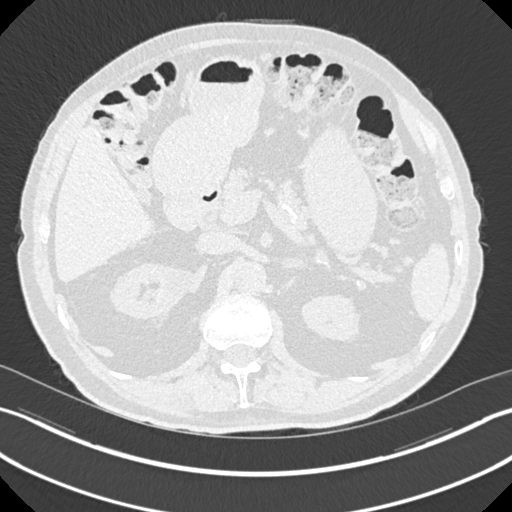
[im 56/415  lung]
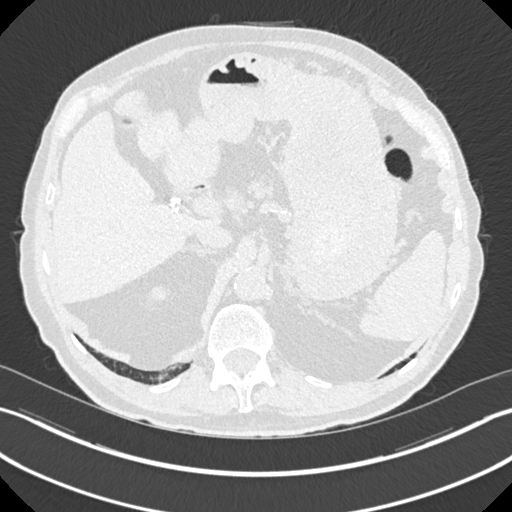
[im 83/415  lung]
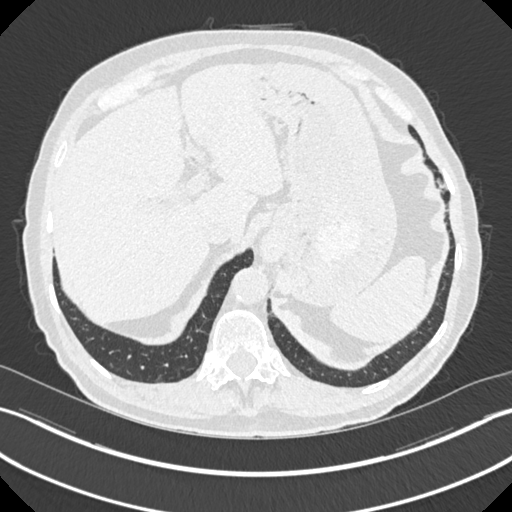
[im 139/415  lung]
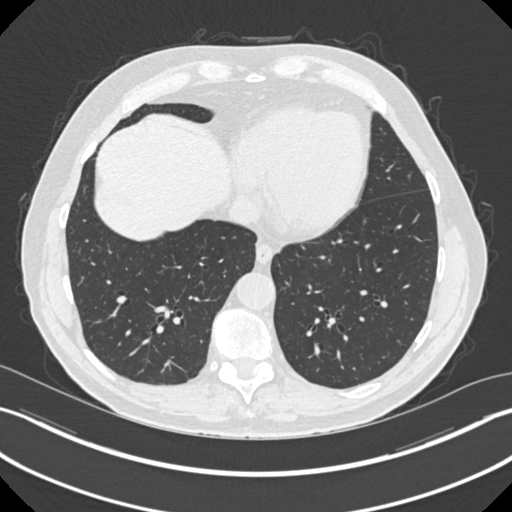
[im 166/415  mediastinal]
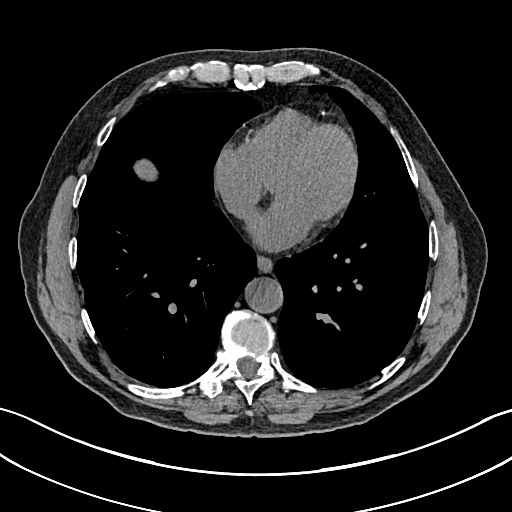
[im 166/415  lung]
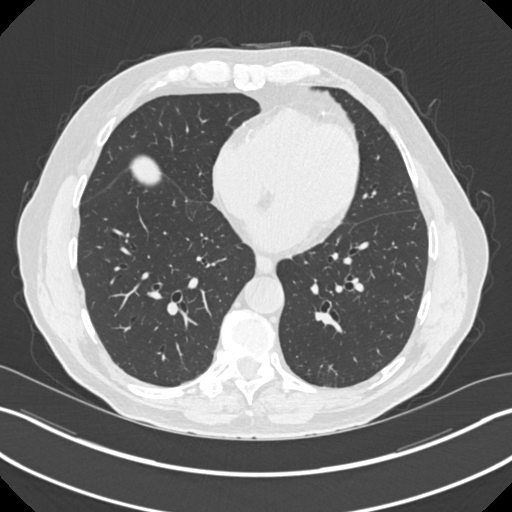
[im 194/415  lung]
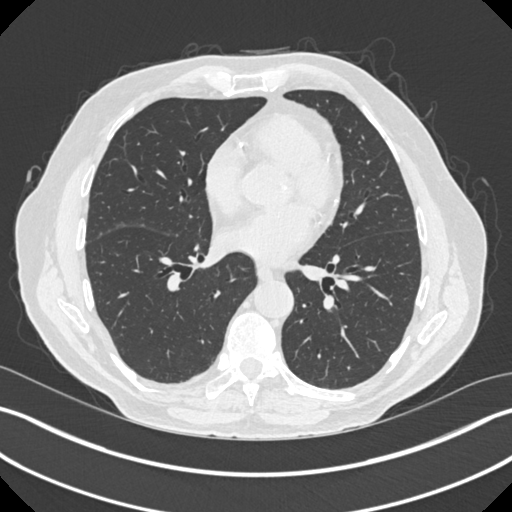
[im 221/415  lung]
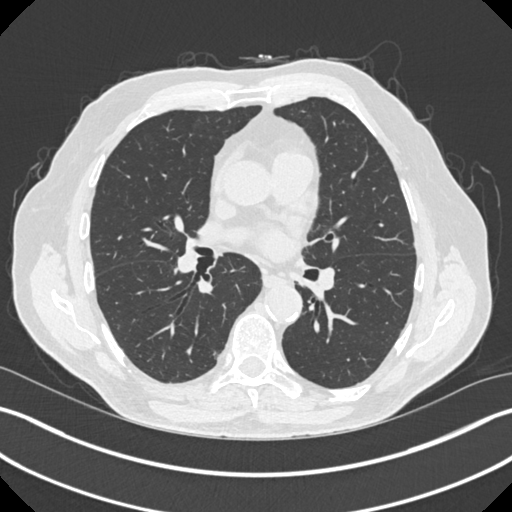
[im 249/415  lung]
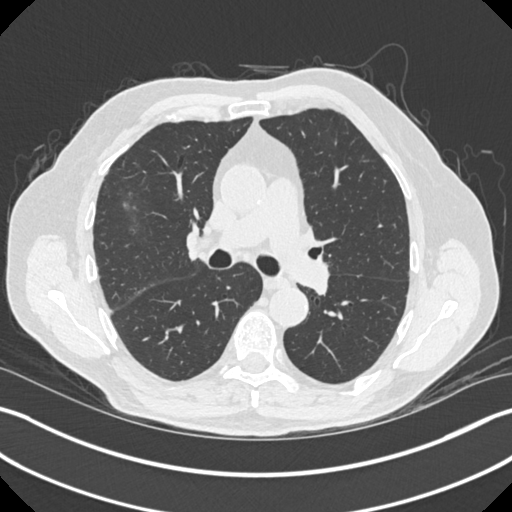
[im 277/415  mediastinal]
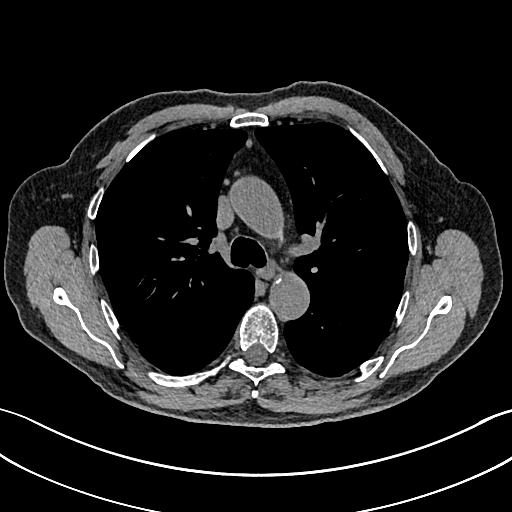
[im 277/415  lung]
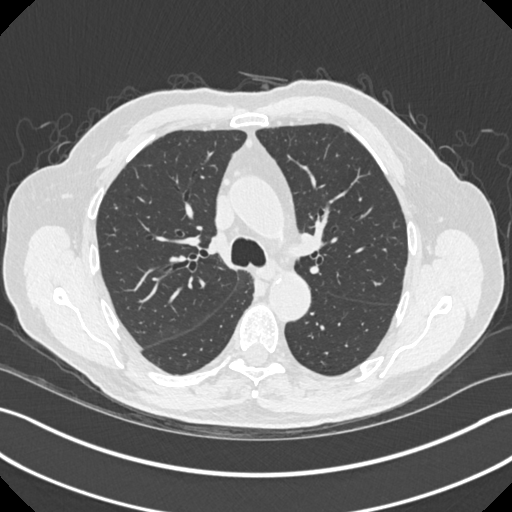
[im 332/415  lung]
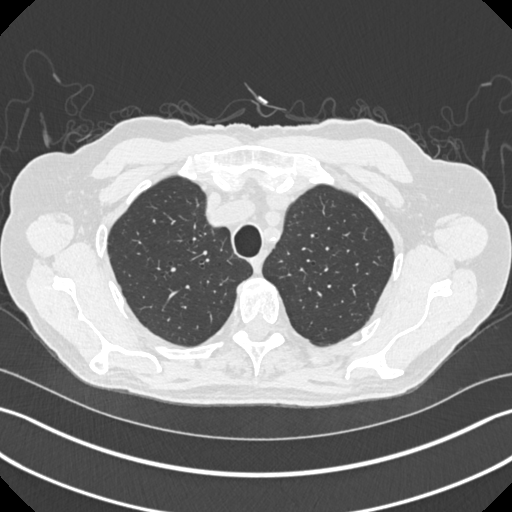
[im 359/415  lung]
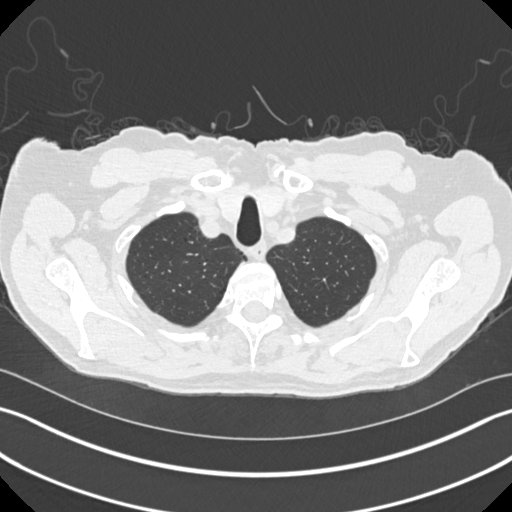
[im 387/415  lung]
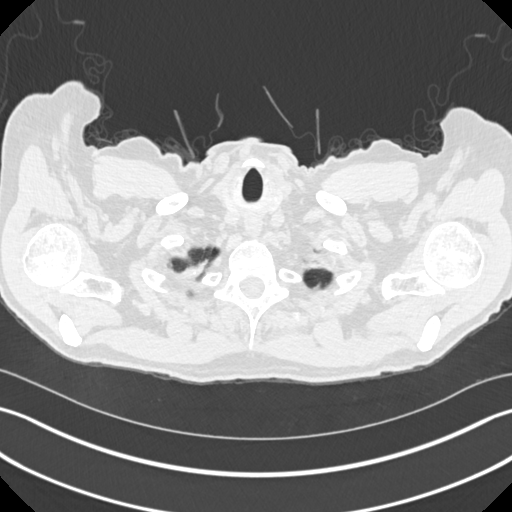

[15 of 36 positions shown; findings below may reference images not displayed]

FINDINGS: Cardiovascular: Limited evaluation in the absence of intravenous
contrast. Conventional 3 vessel arch anatomy. Mild scattered
atherosclerotic calcifications throughout the aorta. The aortic
valve is diffusely thickened and calcified. Calcifications noted
throughout the coronary arteries. The heart is normal in size. The
main pulmonary artery is normal in size. No pericardial effusion.

Mediastinum/Nodes: Unremarkable CT appearance of the thyroid gland.
No suspicious mediastinal or hilar adenopathy. No soft tissue
mediastinal mass. The thoracic esophagus is unremarkable.

Lungs/Pleura: Mild biapical pleuroparenchymal scarring. Punctate
calcified granuloma in the right upper lobe. Trace paraseptal
emphysematous changes. No suspicious pulmonary mass or nodule.

Upper Abdomen: The gallbladder is surgically absent. No intra or
extrahepatic biliary ductal dilatation. Diffuse thickening of the
gastric wall similar to that observed in Wednesday January, 2021. No
suspicious lymphadenopathy. Water attenuation cystic structure in
the medial upper pole of the right kidney demonstrates no interval
change and is almost certainly a benign cyst. No further follow-up
required. No acute abnormality within the visualized upper abdomen.

Musculoskeletal: Chronic appearing compression fracture of T11 with
approximately 50% height loss centrally. No lytic or blastic osseous
lesion.
IMPRESSION: 1. Aortic and multivessel coronary artery atherosclerotic vascular
calcifications.
2. Trace paraseptal emphysematous changes.
3. Thickened and calcified aortic valve. If there is clinical
concern valvular pathology, echocardiography could further evaluate.
4. Diffuse thickening of the gastric wall again noted as seen on
prior CT scan dated 02/09/2021. As previously recommended, direct
visualization should be performed to exclude pathology unless
previously evaluated.
5. Chronic T11 compression fracture.

Aortic Atherosclerosis (FL2GS-TQ3.3) and Emphysema (FL2GS-BS0.3).

## 2023-05-02 ENCOUNTER — Other Ambulatory Visit: Payer: Self-pay | Admitting: Cardiovascular Disease

## 2023-05-03 NOTE — Telephone Encounter (Signed)
Refill Request.  

## 2023-05-18 NOTE — Progress Notes (Unsigned)
Cardiology Office Note:  .   Date:  05/19/2023  ID:  Perry Rosales, DOB 10-Jan-1936, MRN [id number] PCP: Garlan Fillers, MD  [facility] Providers Cardiologist:  Reatha Harps, MD PV Cardiologist:  Lorine Bears, MD { History of Present Illness: .   Perry Rosales is a 87 y.o. male with history of PAD, CHF, HTN, HLD who presents for follow-up.   History of Present Illness   Perry Rosales, an 87 year old male with a history of PAD, systolic heart failure with recovered ejection fraction, hypertension, and hyperlipidemia, presents for follow-up after recent placement of left external iliac stenting. He reports feeling lightheaded this morning upon standing, which lasted for about 45 seconds before resolving. This has occurred less than half a dozen times in the past six months. He also notes that his blood pressure was unusually low this morning, though it typically runs between 130-140/60-70 at home. He denies any chest pain or trouble breathing and has been active around the house and walking. He reports a small amount of swelling in his left leg, but it has improved significantly since the stent placement.          Problem List Systolic HF -EF 40% 08/2022  -EF 55% 10/2022   2. HTN 3. HLD -T chol 154, HDL 35, LDL 101, TG 90 4.CKD IIIa -eGFR 48 5. PAD -Stent placement L external iliac artery 11/04/2022    ROS: All other ROS reviewed and negative. Pertinent positives noted in the HPI.     Studies Reviewed: Marland Kitchen       Physical Exam:   VS:  BP 92/60 (BP Location: Right Arm, Patient Position: Sitting, Cuff Size: Normal)   Pulse 91   Ht 5\' 11"  (1.803 m)   Wt 178 lb 12.8 oz (81.1 kg)   SpO2 93%   BMI 24.94 kg/m    Wt Readings from Last 3 Encounters:  05/19/23 178 lb 12.8 oz (81.1 kg)  12/01/22 174 lb 6.4 oz (79.1 kg)  11/04/22 171 lb (77.6 kg)    GEN: Well nourished, well developed in no acute distress NECK: No JVD; No carotid bruits CARDIAC: RRR, no murmurs, rubs,  gallops RESPIRATORY:  Clear to auscultation without rales, wheezing or rhonchi  ABDOMEN: Soft, non-tender, non-distended EXTREMITIES:  No edema; No deformity  ASSESSMENT AND PLAN: .   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 Low blood pressure and occasional dizziness upon standing. -Discontinue Metoprolol. -Adjust Lasix to as needed for edema.  Peripheral Artery Disease (PAD) Status post left external iliac stenting with subsequent healing of leg ulcer. Completed three months of dual antiplatelet therapy (DAPT). -Discontinue Plavix, continue Aspirin monotherapy.  Hypertension Blood pressure readings at home typically 130-140/60-70, but noted low reading today (92/60). -Monitor blood pressure at home, report any significant changes. -stop metoprolol   Hyperlipidemia Most recent LDL 101. -Continue Rosuvastatin. -Check lipid panel today.  Follow-up Plans -See APP in three months for blood pressure check. -Annual follow-up with me.              Follow-up: Return in about 3 months (around 08/19/2023).  Time Spent with Patient: I have spent a total of 35 minutes caring for this patient today face to face, ordering and reviewing labs/tests, reviewing prior records/medical history, examining the patient, establishing an assessment and plan, communicating results/findings to the patient/family, and documenting in the medical record.   Signed,  Gerri Spore T. Flora Lipps, MD, [facility]  [facility]  [address], [address] [place], [place] [address] [phone]  2:18 PM

## 2023-05-19 ENCOUNTER — Encounter: Payer: Self-pay | Admitting: Cardiovascular Disease

## 2023-05-19 ENCOUNTER — Ambulatory Visit: Payer: Medicare Other | Attending: Cardiovascular Disease | Admitting: Cardiovascular Disease

## 2023-05-19 VITALS — BP 92/60 | HR 91 | Ht 71.0 in | Wt 178.8 lb

## 2023-05-19 DIAGNOSIS — I10 Essential (primary) hypertension: Secondary | ICD-10-CM | POA: Insufficient documentation

## 2023-05-19 DIAGNOSIS — I739 Peripheral vascular disease, unspecified: Secondary | ICD-10-CM | POA: Diagnosis present

## 2023-05-19 DIAGNOSIS — E782 Mixed hyperlipidemia: Secondary | ICD-10-CM | POA: Diagnosis present

## 2023-05-19 DIAGNOSIS — I5022 Chronic systolic (congestive) heart failure: Secondary | ICD-10-CM | POA: Insufficient documentation

## 2023-05-19 MED ORDER — FUROSEMIDE 20 MG PO TABS: ORAL_TABLET | ORAL | 3 refills | Status: DC

## 2023-05-19 NOTE — Patient Instructions (Addendum)
Medication Instructions:  - STOP PLAVIX 75MG  - STOP METOPROLOL 25MG  - Start Lasix as needed for swelling or weight gain 3 pounds overnight or 5 pounds in one week   *If you need a refill on your cardiac medications before your next appointment, please call your pharmacy*   Lab Work: Lipids    If you have labs (blood work) drawn today and your tests are completely normal, you will receive your results only by: MyChart Message (if you have MyChart) OR A paper copy in the mail If you have any lab test that is abnormal or we need to change your treatment, we will call you to review the results.   Testing/Procedures: None    Follow-Up: At [facility], you and your health needs are our priority.  As part of our continuing mission to provide you with exceptional heart care, we have created designated Provider Care Teams.  These Care Teams include your primary Cardiologist (physician) and Advanced Practice Providers (APPs -  Physician Assistants and Nurse Practitioners) who all work together to provide you with the care you need, when you need it.  We recommend signing up for the patient portal called "MyChart".  Sign up information is provided on this After Visit Summary.  MyChart is used to connect with patients for Virtual Visits (Telemedicine).  Patients are able to view lab/test results, encounter notes, upcoming appointments, etc.  Non-urgent messages can be sent to your provider as well.   To learn more about what you can do with MyChart, go to [email or web address].    Your next appointment:   3 month(s)  The format for your next appointment:   In Person  Provider:   Edd Fabian, FNP, Micah Flesher, PA-C, Marjie Skiff, PA-C, Robet Leu, PA-C, Juanda Crumble, PA-C, Joni Reining, DNP, ANP, Azalee Course, PA-C, or Bernadene Person, NP    Then, Reatha Harps, MD will plan to see you again in 1 year(s).   Provider:   Reatha Harps, MD    Other Instructions Check  your blood pressure daily and keep a log until your next appointment in 3 months.

## 2023-05-20 LAB — LIPID PANEL
Chol/HDL Ratio: 3.4 ratio (ref 0.0–5.0)
Cholesterol, Total: 127 mg/dL (ref 100–199)
HDL: 37 mg/dL — ABNORMAL LOW (ref >39)
LDL Chol Calc (NIH): 61 mg/dL (ref 0–99)
Triglycerides: 175 mg/dL — ABNORMAL HIGH (ref 0–149)
VLDL Cholesterol Cal: 29 mg/dL (ref 5–40)

## 2023-05-28 ENCOUNTER — Encounter (HOSPITAL_COMMUNITY): Payer: BLUE CROSS/BLUE SHIELD

## 2023-05-31 ENCOUNTER — Ambulatory Visit (HOSPITAL_COMMUNITY)
Admission: RE | Admit: 2023-05-31 | Discharge: 2023-05-31 | Disposition: A | Discharge status: Home / Self Care | Payer: Medicare Other | Source: Ambulatory Visit | Attending: Cardiovascular Disease | Admitting: Cardiovascular Disease

## 2023-05-31 ENCOUNTER — Ambulatory Visit (HOSPITAL_BASED_OUTPATIENT_CLINIC_OR_DEPARTMENT_OTHER)
Admission: RE | Admit: 2023-05-31 | Discharge: 2023-05-31 | Disposition: A | Discharge status: Home / Self Care | Payer: Medicare Other | Source: Ambulatory Visit | Attending: Cardiovascular Disease | Admitting: Cardiovascular Disease

## 2023-05-31 DIAGNOSIS — I739 Peripheral vascular disease, unspecified: Secondary | ICD-10-CM | POA: Insufficient documentation

## 2023-05-31 DIAGNOSIS — Z95820 Peripheral vascular angioplasty status with implants and grafts: Secondary | ICD-10-CM | POA: Insufficient documentation

## 2023-05-31 LAB — VAS US ABI WITH/WO TBI
Left ABI: 0.65
Right ABI: 0.71

## 2023-06-01 ENCOUNTER — Encounter (HOSPITAL_COMMUNITY): Payer: BLUE CROSS/BLUE SHIELD

## 2023-06-01 ENCOUNTER — Encounter: Payer: Self-pay | Admitting: Cardiovascular Disease

## 2023-06-01 ENCOUNTER — Ambulatory Visit: Payer: Medicare Other | Attending: Cardiovascular Disease | Admitting: Cardiovascular Disease

## 2023-06-01 VITALS — BP 100/52 | HR 100 | Ht 71.0 in | Wt 180.0 lb

## 2023-06-01 DIAGNOSIS — E785 Hyperlipidemia, unspecified: Secondary | ICD-10-CM | POA: Insufficient documentation

## 2023-06-01 DIAGNOSIS — I5022 Chronic systolic (congestive) heart failure: Secondary | ICD-10-CM | POA: Insufficient documentation

## 2023-06-01 DIAGNOSIS — I739 Peripheral vascular disease, unspecified: Secondary | ICD-10-CM | POA: Insufficient documentation

## 2023-06-01 DIAGNOSIS — I10 Essential (primary) hypertension: Secondary | ICD-10-CM | POA: Diagnosis present

## 2023-06-01 NOTE — Patient Instructions (Signed)
Medication Instructions:  No Changes *If you need a refill on your cardiac medications before your next appointment, please call your pharmacy*   Lab Work: None ordered If you have labs (blood work) drawn today and your tests are completely normal, you will receive your results only by: MyChart Message (if you have MyChart) OR A paper copy in the mail If you have any lab test that is abnormal or we need to change your treatment, we will call you to review the results.   Testing/Procedures: Aorta and ABI dopplers in 12 months   Follow-Up: At [facility], you and your health needs are our priority.  As part of our continuing mission to provide you with exceptional heart care, we have created designated Provider Care Teams.  These Care Teams include your primary Cardiologist (physician) and Advanced Practice Providers (APPs -  Physician Assistants and Nurse Practitioners) who all work together to provide you with the care you need, when you need it.  We recommend signing up for the patient portal called "MyChart".  Sign up information is provided on this After Visit Summary.  MyChart is used to connect with patients for Virtual Visits (Telemedicine).  Patients are able to view lab/test results, encounter notes, upcoming appointments, etc.  Non-urgent messages can be sent to your provider as well.   To learn more about what you can do with MyChart, go to [email or web address].    Your next appointment:   12 month(s)  Provider:   Dr. Kirke Corin

## 2023-06-01 NOTE — Progress Notes (Signed)
Cardiology Office Note   Date:  06/01/2023   ID:  Perry Rosales, DOB 12-19-1935, MRN [id number]  PCP:  Garlan Fillers, MD  Cardiologist: Dr. Flora Lipps   No chief complaint on file.     History of Present Illness: Perry Rosales is a 87 y.o. male who is here today for a follow-up visit regarding peripheral arterial disease.  He has known history of chronic systolic heart failure, chronic kidney disease, essential hypertension and peripheral arterial disease. The patient is not diabetic.  He quit smoking few years ago but smoked half a pack per day for at least 20 years. He was hospitalized in March of this year with fracture of the left hip that required surgery.  He had an echocardiogram done which showed an EF of 40 to 45% with wall motion abnormality suggestive of stress-induced cardiomyopathy versus an LAD infarct.  He had no anginal symptoms and thus cardiac catheterization has not been pursued yet.  He was seen for a nonhealing ulceration on the bottom of the left foot.  ABI was moderately reduced bilaterally with evidence of left SFA occlusion.    I proceeded with angiography in May which showed severe stenosis at the junction of the left common iliac and external iliac arteries with flush occlusion of the left SFA with reconstitution via well-developed collaterals from the profunda and two-vessel runoff below the knee.  There was moderate right external iliac artery stenosis.  I performed successful angioplasty and self-expanding stent placement to the left external iliac artery. Doppler studies yesterday showed an ABI of 0.71 on the right and 0.65 on the left.  Duplex showed patent left iliac stent with no significant restenosis.  The ulceration on the left foot healed completely.  He has been doing well with no claudication.  No chest pain or shortness of breath.   Past Medical History:  Diagnosis Date   CHF (congestive heart failure) (HCC)    GERD (gastroesophageal  reflux disease)    Heart murmur    Hypertension     Past Surgical History:  Procedure Laterality Date   ABDOMINAL AORTOGRAM W/LOWER EXTREMITY N/A 11/04/2022   Procedure: ABDOMINAL AORTOGRAM W/LOWER EXTREMITY;  Surgeon: Iran Ouch, MD;  Location: [facility] INVASIVE [facility];  Service: Cardiovascular;  Laterality: N/A;   CATARACT EXTRACTION W/ INTRAOCULAR LENS  IMPLANT, BILATERAL     CHOLECYSTECTOMY     COLONOSCOPY     PERIPHERAL VASCULAR INTERVENTION Left 11/04/2022   Procedure: PERIPHERAL VASCULAR INTERVENTION;  Surgeon: Iran Ouch, MD;  Location: [facility] INVASIVE CV [facility];  Service: Cardiovascular;  Laterality: Left;  External Illiac   TOTAL HIP ARTHROPLASTY Left 09/10/2022   Procedure: TOTAL HIP REPLACEMENT;  Surgeon: Joen Laura, MD;  Location: [facility] OR;  Service: Orthopedics;  Laterality: Left;     Current Outpatient Medications  Medication Sig Dispense Refill   amitriptyline (ELAVIL) 50 MG tablet Take 1 tablet (50 mg total) by mouth at bedtime. 30 tablet 0   aspirin EC 81 MG tablet Take 81 mg by mouth daily. Swallow whole.     furosemide (LASIX) 20 MG tablet Take 1 tablet (20mg ) as needed for swelling or weight gain of 3 pounds overnight or 5 pounds in a week 90 tablet 3   gabapentin (NEURONTIN) 100 MG capsule Take 1 capsule (100 mg total) by mouth 2 (two) times daily. 60 capsule 0   omeprazole (PRILOSEC) 20 MG capsule Take 1 capsule (20 mg total) by mouth daily. 30 capsule  0   rosuvastatin (CRESTOR) 10 MG tablet Take 10 mg by mouth daily.     tamsulosin (FLOMAX) 0.4 MG CAPS capsule Take 1 capsule (0.4 mg total) by mouth in the morning and at bedtime. 30 capsule 0   acetaminophen (TYLENOL) 500 MG tablet Take 500-1,000 mg by mouth every 6 (six) hours as needed for moderate pain. (Patient not taking: Reported on 06/01/2023)     calcium carbonate (TUMS - DOSED IN MG ELEMENTAL CALCIUM) 500 MG chewable tablet Chew 2-3 tablets by mouth daily as needed for indigestion or heartburn. (Patient  not taking: Reported on 06/01/2023)     methocarbamol (ROBAXIN) 500 MG tablet Take 1 tablet (500 mg total) by mouth every 8 (eight) hours as needed for muscle spasms. (Patient not taking: Reported on 06/01/2023) 90 tablet 0   polyethylene glycol (MIRALAX / GLYCOLAX) 17 g packet Take 17 g by mouth daily. (Patient not taking: Reported on 06/01/2023) 14 each 0   potassium chloride (KLOR-CON) 10 MEQ tablet Take 1 tablet (10 mEq total) by mouth daily. 90 tablet 3   senna-docusate (SENOKOT-S) 8.6-50 MG tablet Take 1 tablet by mouth 2 (two) times daily. (Patient not taking: Reported on 06/01/2023)     No current facility-administered medications for this visit.    Allergies:   Patient has no known allergies.    Social History:  The patient  reports that he quit smoking about 2 years ago. His smoking use included cigarettes. He started smoking about 62 years ago. He has a 30 pack-year smoking history. He has been exposed to tobacco smoke. He has never used smokeless tobacco. He reports that he does not drink alcohol and does not use drugs.   Family History:  The patient's family history is not on file.    ROS:  Please see the history of present illness.   Otherwise, review of systems are positive for none.   All other systems are reviewed and negative.    PHYSICAL EXAM: VS:  BP (!) 100/52 (BP Location: Left Arm, Patient Position: Sitting, Cuff Size: Normal)   Pulse 100   Ht 5\' 11"  (1.803 m)   Wt 180 lb (81.6 kg)   SpO2 94%   BMI 25.10 kg/m  , BMI Body mass index is 25.1 kg/m. GEN: Well nourished, well developed, in no acute distress  HEENT: normal  Neck: no JVD, carotid bruits, or masses Cardiac: RRR; no murmurs, rubs, or gallops,no edema  Respiratory:  clear to auscultation bilaterally, normal work of breathing GI: soft, nontender, nondistended, + BS MS: no deformity or atrophy  Skin: warm and dry, no rash Neuro:  Strength and sensation are intact Psych: euthymic mood, full  affect Vascular: Femoral pulses normal bilaterally.  Distal pulses are not palpable.   EKG:  EKG is not ordered today.    Recent Labs: 09/08/2022: B Natriuretic Peptide 507.3 09/13/2022: ALT 17 10/26/2022: BUN 23; Creatinine, Ser 1.32; Potassium 4.6; Sodium 142 10/27/2022: Hemoglobin 12.2; Platelets 177    Lipid Panel    Component Value Date/Time   CHOL 127 05/19/2023 1418   TRIG 175 (H) 05/19/2023 1418   HDL 37 (L) 05/19/2023 1418   CHOLHDL 3.4 05/19/2023 1418   LDLCALC 61 05/19/2023 1418      Wt Readings from Last 3 Encounters:  06/01/23 180 lb (81.6 kg)  05/19/23 178 lb 12.8 oz (81.1 kg)  12/01/22 174 lb 6.4 oz (79.1 kg)           No data to display  ASSESSMENT AND PLAN:  1.  Peripheral arterial disease: Status post left external iliac artery stent placement with excellent results.  This was done for critical limb ischemia with slow healing ulceration affecting the left foot.  The ulceration healed completely.  He still has left SFA occlusion but he is currently asymptomatic.  I agree with stopping clopidogrel and continuing aspirin 81 mg once daily.  Repeat Doppler studies in a year.   2.  Chronic systolic heart failure: He appears to be euvolemic.   Metoprolol was stopped recently due to low blood pressure.  3.  Essential hypertension: Blood pressure is controlled without medications.  4.  Hyperlipidemia: Continue treatment with rosuvastatin with a target LDL of less than 70.  Recent lipid profile showed an LDL of 61.    Disposition: Follow-up in 1 year.  Signed,  Lorine Bears, MD  06/01/2023 10:35 AM    [facility]

## 2023-06-02 NOTE — Progress Notes (Signed)
Seen and discussed by Dr. Kirke Corin during office visit yesterday

## 2023-08-17 ENCOUNTER — Encounter: Payer: Self-pay | Admitting: Emergency Medicine

## 2023-08-17 ENCOUNTER — Ambulatory Visit: Payer: Medicare Other | Attending: General Practice | Admitting: Emergency Medicine

## 2023-08-17 VITALS — BP 102/60 | HR 82 | Ht 71.0 in | Wt 181.0 lb

## 2023-08-17 DIAGNOSIS — I10 Essential (primary) hypertension: Secondary | ICD-10-CM | POA: Diagnosis not present

## 2023-08-17 DIAGNOSIS — I951 Orthostatic hypotension: Secondary | ICD-10-CM | POA: Diagnosis present

## 2023-08-17 DIAGNOSIS — I739 Peripheral vascular disease, unspecified: Secondary | ICD-10-CM | POA: Diagnosis present

## 2023-08-17 DIAGNOSIS — E782 Mixed hyperlipidemia: Secondary | ICD-10-CM | POA: Insufficient documentation

## 2023-08-17 DIAGNOSIS — I5022 Chronic systolic (congestive) heart failure: Secondary | ICD-10-CM | POA: Diagnosis present

## 2023-08-17 NOTE — Progress Notes (Signed)
 Cardiology Office Note:    Date:  08/17/2023  ID:  Perry Rosales, DOB 1936/05/01, MRN [id number] PCP: Perry Fillers, Rosales  [facility] Providers Cardiologist:  Perry Harps, Rosales PV Cardiologist:  Perry Bears, Rosales       Patient Profile:      Perry Rosales is a 88 y.o. male with visit-pertinent history of PAD, systolic heart failure with recovered ejection fraction, hypertension, hyperlipidemia, CKD stage IIIa  He was seen in the hospital for abnormal echocardiogram in the setting of fall and left hip fracture.  Echocardiogram obtained on 09/08/2022 showed EF 40-45%, regional wall abnormality in the LAD territory concerning for stress cardiomyopathy, grade 1 DD, moderately elevated PA systolic pressure.  He had no chest pain therefore he proceeded with orthopedic surgery.  He underwent diuresis and GDMT titration.  Repeat echocardiogram ordered and completed on 10/30/2022 showing EF improved to 55%, basal inferior severe hypokinesis, grade 1 DD.  The previous wall motion abnormality in the septum and apex has resolved.    He also has moderate right lower extremity disease based on ABIs.  He was evaluated by Perry Rosales on 10/27/2022 who recommended proceeding with abdominal aortogram with lower extremity angiography.  Patient underwent procedure on 11/04/2022 which revealed severe stenosis at the junction of the distal left common iliac artery and ostial left external iliac artery, flush occlusion of the left SFA with reconstitution via well-developed collaterals from the profunda and two-vessel runoff below the knee via posterior and peroneal arteries, moderate right external iliac artery stenosis.  He underwent successful angioplasty and self-expanding stent placement to the ostial left external iliac artery extending into the distal common iliac artery.  Postprocedure patient was placed on aspirin and Plavix with recommendation to continue DAPT for 3 months.  ABI obtained 11/25/2022 showed  right ABI 0.56, left ABI 0.52.  Vas ultrasound showed patent left iliac stent.  Most recent Doppler study 05/2023 showed ABI of 0.71 on the right and 0.65 on the left.  Duplex showed patent left iliac stent with no significant restenosis.  He was seen by Perry Rosales on 05/19/2023.  Patient was noted to be hypotensive with blood pressure 92/60.  His metoprolol was stopped he was to monitor blood pressure at home and follow-up in 3 months.      History of Present Illness:  Discussed the use of AI scribe software for clinical note transcription with the patient, who gave verbal consent to proceed.  Perry Rosales is a 88 y.o. male who returns for 44-month follow-up for hypotension.  Patient comes into clinic today by himself.  The patient reports feeling well with no cardiovascular concerns or complaints.  He denies any hypotensive episodes since his last visit 3 months ago.  He has been monitoring his blood pressure at home three times a day and has noticed a trend of blood pressure being lower in the morning and gradually increasing throughout the day. The patient has not experienced any lightheadedness, syncope, falls, chest pain, dyspnea, or dizziness since the adjustment of his medications. The patient's blood pressure readings range from the 100s to 150s, with the higher readings typically occurring in the evening. The patient is active and exercises regularly, but notes that he needs to manage his time better to avoid prolonged periods of sitting.     Review of Systems  Constitutional: Negative for weight gain and weight loss.  Cardiovascular:  Negative for chest pain, claudication, dyspnea on exertion, irregular heartbeat, leg swelling,  near-syncope, orthopnea, palpitations, paroxysmal nocturnal dyspnea and syncope.  Respiratory:  Negative for cough, hemoptysis and shortness of breath.   Gastrointestinal:  Negative for abdominal pain, hematochezia and melena.  Genitourinary:  Negative for  hematuria.  Neurological:  Negative for dizziness and light-headedness.     See HPI     Home Medications:    Prior to Admission medications   Medication Sig Start Date End Date Taking? Authorizing Provider  acetaminophen (TYLENOL) 500 MG tablet Take 500-1,000 mg by mouth every 6 (six) hours as needed for moderate pain. Patient not taking: Reported on 06/01/2023    Perry Rosales  amitriptyline (ELAVIL) 50 MG tablet Take 1 tablet (50 mg total) by mouth at bedtime. 09/17/22   Perry Rosales  aspirin EC 81 MG tablet Take 81 mg by mouth daily. Swallow whole.    Perry Rosales  calcium carbonate (TUMS - DOSED IN MG ELEMENTAL CALCIUM) 500 MG chewable tablet Chew 2-3 tablets by mouth daily as needed for indigestion or heartburn. Patient not taking: Reported on 06/01/2023    Perry Rosales  furosemide (LASIX) 20 MG tablet Take 1 tablet (20mg ) as needed for swelling or weight gain of 3 pounds overnight or 5 pounds in a week 05/19/23   Rosales, Perry Ramp, Rosales  gabapentin (NEURONTIN) 100 MG capsule Take 1 capsule (100 mg total) by mouth 2 (two) times daily. 09/17/22   Perry Rosales  methocarbamol (ROBAXIN) 500 MG tablet Take 1 tablet (500 mg total) by mouth every 8 (eight) hours as needed for muscle spasms. Patient not taking: Reported on 06/01/2023 09/17/22   Perry Rosales  omeprazole (PRILOSEC) 20 MG capsule Take 1 capsule (20 mg total) by mouth daily. 09/17/22   Perry Rosales  polyethylene glycol (MIRALAX / GLYCOLAX) 17 g packet Take 17 g by mouth daily. Patient not taking: Reported on 06/01/2023 09/13/22   Perry Rosales  potassium chloride (KLOR-CON) 10 MEQ tablet Take 1 tablet (10 mEq total) by mouth daily. 10/26/22 01/24/23  Perry Rosales  rosuvastatin (CRESTOR) 10 MG tablet Take 10 mg by mouth daily.    Perry Rosales  senna-docusate (SENOKOT-S) 8.6-50 MG tablet Take 1 tablet by mouth 2 (two) times  daily. Patient not taking: Reported on 06/01/2023 09/12/22   Perry Rosales  tamsulosin (FLOMAX) 0.4 MG CAPS capsule Take 1 capsule (0.4 mg total) by mouth in the morning and at bedtime. 09/17/22   Charlton Amor, Rosales   Studies Reviewed:   EKG Interpretation Date/Time:  Tuesday August 17 2023 10:27:52 EST Ventricular Rate:  81 PR Interval:  110 QRS Duration:  108 QT Interval:  394 QTC Calculation: 457 R Axis:   62  Text Interpretation: Sinus rhythm with short PR Confirmed by Rise Paganini [phone]) on 08/17/2023 11:05:38 AM    Echocardiogram 10/30/2022 1. Left ventricular ejection fraction, by estimation, is 55%. The left  ventricle has normal function. The left ventricle demonstrates regional  wall motion abnormalities with basal inferior severe hypokinesis. Left  ventricular diastolic parameters are  consistent with Grade I diastolic dysfunction (impaired relaxation).   2. Right ventricular systolic function is normal. The right ventricular  size is normal. Tricuspid regurgitation signal is inadequate for assessing  PA pressure.   3. The mitral valve is normal in structure. No evidence of mitral valve  regurgitation. No evidence of mitral stenosis.   4. The aortic valve is tricuspid. There is moderate calcification of the  aortic valve. Aortic valve regurgitation is not visualized. Aortic valve  sclerosis/calcification is present, without any evidence of aortic  stenosis. Aortic valve mean gradient  measures 7.0 mmHg.   5. The inferior vena cava is normal in size with greater than 50%  respiratory variability, suggesting right atrial pressure of 3 mmHg.   Abdominal Aortogram 11/04/2022 1.  Severe stenosis at the junction of distal left common iliac artery and ostial left external iliac artery, flush occlusion of the left SFA with reconstitution via well-developed collaterals from the profunda and two-vessel runoff below the knee via the posterior and peroneal arteries. 2.   Moderate right external iliac artery stenosis.  Full runoff was not performed on the right lower extremity. 3.  Successful angioplasty and self-expanding stent placement to the ostial left external iliac artery extending into the distal common iliac artery.  Difficult procedure due to tortuosity with inability to perform the procedure from the contralateral approach that was successful from the left common femoral artery approach. Risk Assessment/Calculations:             Physical Exam:   VS:  BP 102/60   Pulse 82   Ht 5\' 11"  (1.803 m)   Wt 181 lb (82.1 kg)   SpO2 97%   BMI 25.24 kg/m    Wt Readings from Last 3 Encounters:  08/17/23 181 lb (82.1 kg)  06/01/23 180 lb (81.6 kg)  05/19/23 178 lb 12.8 oz (81.1 kg)    Constitutional:      Appearance: Normal and healthy appearance. Not in distress.  Neck:     Vascular: JVD normal.  Pulmonary:     Effort: Pulmonary effort is normal.     Breath sounds: Normal breath sounds.  Chest:     Chest wall: Not tender to palpatation.  Cardiovascular:     PMI at left midclavicular line. Normal rate. Regular rhythm. Normal S1. Normal S2.      Murmurs: There is no murmur.     No gallop.  No click. No rub.  Pulses:    Intact distal pulses.  Edema:    Peripheral edema absent.  Musculoskeletal: Normal range of motion.     Cervical back: Normal range of motion and neck supple. Skin:    General: Skin is warm and dry.  Neurological:     General: No focal deficit present.     Mental Status: Alert, oriented to person, place, and time and oriented to person, place and time.  Psychiatric:        Mood and Affect: Mood and affect normal.        Behavior: Behavior is cooperative.        Thought Content: Thought content normal.        Assessment and Plan:  Hypertension /  Orthostatic Hypotension He was hypotensive during office visit 04/2023 with associated orthostatic hypotension that had occurred 5-6 times over the past 6 months, BP at that time  was 92/60 and Metoprolol-XL 25mg  was discontinued and Lasix adjusted from daily to as needed. EKG today normal sinus rhythm without ST-T wave changes -Since medication change patient has been asymptomatic without lightheadedness, dizziness, syncope, falls. -He is monitoring his blood pressure 3 times daily with average a.m. BP between 100 - 110s and p.m. blood pressure 130 - 150s. -Blood pressure seems to be under adequate control.  Given age and history of fall with subsequent hip fracture and hypotension, his goal blood pressure should be less than 140/90. -Continue daily blood pressure checks -  Continue current medication regimen  Chronic systolic heart failure LVEF 08/2022 was 40-45% with improvement of LVEF to 55% on 10/2022.  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  NYHA class I -He is without any overt heart failure symptoms including dyspnea, orthopnea, PND, leg swelling, weight gain. -Continue Lasix 20 mg as needed for weight gain of 3 pounds overnight or 5 pounds in a week  Peripheral arterial disease S/p left external iliac stenting with excellent results completed for critical limb ischemia and slow healing ulceration affecting the left foot with subsequent healing of ulcer.  He still has left SFA occlusion but currently asymptomatic.  Completed 3 months of DAPT now on aspirin monotherapy -He is without any claudication or lower leg pain -Repeat Doppler studies to be completed in 1 year (05/2024) that are ordered subsequent follow-up with Perry Rosales at that time.  Hyperlipidemia LDL 61 on 04/2023 and under excellent control -Continue rosuvastatin 10 mg daily            Dispo:  Return in about 6 months (around 02/14/2024).  Signed, Denyce Robert, NP

## 2023-08-17 NOTE — Patient Instructions (Signed)
 Medication Instructions:  The current medical regimen is effective;  continue present plan and medications as directed. Please refer to the Current Medication list given to you today.  *If you need a refill on your cardiac medications before your next appointment, please call your pharmacy*  Lab Work: NONE  Testing/Procedures: NONE  Follow-Up: At [facility], you and your health needs are our priority.  As part of our continuing mission to provide you with exceptional heart care, we have created designated Provider Care Teams.  These Care Teams include your primary Cardiologist (physician) and Advanced Practice Providers (APPs -  Physician Assistants and Nurse Practitioners) who all work together to provide you with the care you need, when you need it.  Your next appointment:   6 month(s)  Provider:   Reatha Harps, MD  or MADISON FOUNTAIN, DNP-C or Edd Fabian, FNP-C

## 2023-09-13 ENCOUNTER — Other Ambulatory Visit: Payer: Self-pay | Admitting: Cardiovascular Disease

## 2023-09-24 ENCOUNTER — Other Ambulatory Visit: Payer: Self-pay | Admitting: Cardiovascular Disease

## 2023-11-19 ENCOUNTER — Telehealth: Payer: Self-pay | Admitting: Emergency Medicine

## 2023-11-19 NOTE — Telephone Encounter (Signed)
 Pt contacted and advised that gabapentin  has not been prescribed by Dr. Rolm Clos. Pt will contact his PCP concerning rash.

## 2023-11-19 NOTE — Telephone Encounter (Signed)
 Pt c/o medication issue:  1. Name of Medication: Gabapentin   2. How are you currently taking this medication (dosage and times per day)?   3. Are you having a reaction (difficulty breathing--STAT)?   4. What is your medication issue? It is causing a rash

## 2024-02-12 NOTE — Progress Notes (Deleted)
 Cardiology Office Note:    Date:  02/12/2024   ID:  Perry Rosales, DOB [birth year], MRN [id number]  PCP:  Yolande Toribio MATSU, MD   [facility] Providers Cardiologist:  Darryle ONEIDA Decent, MD { Click to update primary MD,subspecialty MD or APP then REFRESH:1}    Referring MD: Yolande Toribio MATSU, MD   Chief complaint: 56-month follow-up  History of Present Illness:    Perry Rosales is a 88 y.o. male with a hx of chronic systolic heart failure, chronic kidney disease, essential hypertension, peripheral arterial disease, hyperlipidemia.   09/08/2022 patient was hospitalized in the ED for a closed fracture of the left hip following a fall.  Echo 09/08/2022 showed EF 40-45%,  RWMA (LAD territory versus stress cardiomyopathy).  G1 DD.  Mildly elevated PASP.  Mild calcification of aortic valve, with mild thickening, no regurg, no stenosis.  Proceeded with orthopedic surgery, underwent diuresis and GDMT titration.  Repeat echo on 10/30/2022 with improved EF of 55%, RWMA with basal inferior severe hypokinesis, G1 DD, no changes to valvular status, aortic valve mean gradient measures 7.0 mmHg.  Moderate right lower extremity disease on ABIs. 11/04/2022: abdominal aortogram with LE angiography revealed severe stenosis at the junction of the distal left common iliac artery and ostial left external iliac artery, flush occlusion of the left SFA with reconstitution via well-developed collaterals from the profunda and two-vessel runoff below the knee via the posterior and peroneal arteries, moderate right external iliac artery stenosis.  Successful angioplasty and stent placement to the ostial left external iliac artery extending into the distal common iliac artery.  Aspirin  and Plavix  were started postprocedure, to be continued for 3 months.  ABIs on 11/25/2022 showed right ABI 0.56, left ABI 0.52.  Vascular duplex showed patent left iliac stent, most recent Doppler study 05/2023 showed ABI of 0.71 on the right  and 0.65 on the left.  On his office visit on 05/19/2023 patient was found to be hypotensive at 92/60, his metoprolol  was stopped and he was to follow-up in 3 months.  At the follow-up on 08/17/2023, patient's pressure had improved averaging anywhere from 100-150s systolic, with a goal of 140/90s given his age and history of frequent falls.  He was euvolemic at the last visit following his previous chronic systolic heart failure.  On aspirin  monotherapy for his PAD, with follow-up and repeat Dopplers ordered with Dr. Darron for 05/2024.  Today he presents to the office with ***   Past Medical History:  Diagnosis Date   CHF (congestive heart failure) (HCC)    GERD (gastroesophageal reflux disease)    Heart murmur    Hypertension     Past Surgical History:  Procedure Laterality Date   ABDOMINAL AORTOGRAM W/LOWER EXTREMITY N/A 11/04/2022   Procedure: ABDOMINAL AORTOGRAM W/LOWER EXTREMITY;  Surgeon: Darron Deatrice LABOR, MD;  Location: [facility] INVASIVE [facility];  Service: Cardiovascular;  Laterality: N/A;   CATARACT EXTRACTION W/ INTRAOCULAR LENS  IMPLANT, BILATERAL     CHOLECYSTECTOMY     COLONOSCOPY     PERIPHERAL VASCULAR INTERVENTION Left 11/04/2022   Procedure: PERIPHERAL VASCULAR INTERVENTION;  Surgeon: Darron Deatrice LABOR, MD;  Location: [facility]VASIVE CV [facility];  Service: Cardiovascular;  Laterality: Left;  External Illiac   TOTAL HIP ARTHROPLASTY Left 09/10/2022   Procedure: TOTAL HIP REPLACEMENT;  Surgeon: Edna Toribio LABOR, MD;  Location: [facility] OR;  Service: Orthopedics;  Laterality: Left;    Current Medications: No outpatient medications have been marked as taking for the 02/14/24 encounter (  Appointment) with Emelia Josefa HERO, NP.     Allergies:   Patient has no known allergies.   Social History   Socioeconomic History   Marital status: Married    Spouse name: Not on file   Number of children: Not on file   Years of education: Not on file   Highest education level: Not on file  Occupational  History   Not on file  Tobacco Use   Smoking status: Former    Current packs/day: 0.00    Average packs/day: 0.5 packs/day for 60.0 years (30.0 ttl pk-yrs)    Types: Cigarettes    Start date: 10    Quit date: 2022    Years since quitting: 3.6    Passive exposure: Current   Smokeless tobacco: Never  Vaping Use   Vaping status: Never Used  Substance and Sexual Activity   Alcohol  use: No   Drug use: No   Sexual activity: Not Currently    Partners: Female  Other Topics Concern   Not on file  Social History Narrative   Not on file   Social Drivers of Health   Financial Resource Strain: Not on file  Food Insecurity: No Food Insecurity (09/08/2022)   Hunger Vital Sign    Worried About Running Out of Food in the Last Year: Never true    Ran Out of Food in the Last Year: Never true  Transportation Needs: No Transportation Needs (09/08/2022)   PRAPARE - Administrator, Civil Service (Medical): No    Lack of Transportation (Non-Medical): No  Physical Activity: Not on file  Stress: Not on file  Social Connections: Not on file     Family History: The patient's ***family history is negative for Colon cancer, Esophageal cancer, Rectal cancer, and Stomach cancer.  ROS:   Please see the history of present illness.    *** All other systems reviewed and are negative.  EKGs/Labs/Other Studies Reviewed:    The following studies were reviewed today: ***      Recent Labs: No results found for requested labs within last 365 days.  Recent Lipid Panel    Component Value Date/Time   CHOL 127 05/19/2023 1418   TRIG 175 (H) 05/19/2023 1418   HDL 37 (L) 05/19/2023 1418   CHOLHDL 3.4 05/19/2023 1418   LDLCALC 61 05/19/2023 1418     Risk Assessment/Calculations:   {Does this patient have ATRIAL FIBRILLATION?:[phone]}  No BP recorded.  {Refresh Note OR Click here to enter BP  :1}***         Physical Exam:    VS:  There were no vitals taken for this visit.     Wt Readings from Last 3 Encounters:  08/17/23 181 lb (82.1 kg)  06/01/23 180 lb (81.6 kg)  05/19/23 178 lb 12.8 oz (81.1 kg)     GEN: *** Well nourished, well developed in no acute distress HEENT: Normal NECK: No JVD; No carotid bruits LYMPHATICS: No lymphadenopathy CARDIAC: ***RRR, no murmurs, rubs, gallops RESPIRATORY:  Clear to auscultation without rales, wheezing or rhonchi  ABDOMEN: Soft, non-tender, non-distended MUSCULOSKELETAL:  No edema; No deformity  SKIN: Warm and dry NEUROLOGIC:  Alert and oriented x 3 PSYCHIATRIC:  Normal affect   ASSESSMENT:    No diagnosis found. PLAN:    In order of problems listed above:  Peripheral arterial disease - Status post left external iliac artery stent placement for critical limb ischemia with slow healing ulceration to the left foot. Ulceration healed completely  following procedure. Was asymptomatic of left SFA occlusion at visit on 06/01/2023. Dr.  Darron continued ASA 81 mg daily at that time, wants to repeat doppler studies in December. - Pulses/cap refill on exam -   Hyperlipidemia - Previous visit continued rosuvastatin with target LDL of <70, LDL was 61 on 88/79/7975 - Will repeat lipid panel today  Hypertension - Last note states BP well controlled without medications - Request home BP log -   HFpEF -  Echo 09/08/2022 showed EF 40-45%,  RWMA (LAD territory versus stress cardiomyopathy).  G1 DD.  Mildly elevated PASP.  Mild calcification of aortic valve, with mild thickening, no regurg, no stenosis. - Repeat echo on 10/30/2022 with improved EF of 55%, RWMA with basal inferior severe hypokinesis, G1 DD, no changes to valvular status, aortic valve mean gradient measures 7.0 mmHg. 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  - Leg swelling? Lasix  usage? - Fatigue, SOB, CP, palp? - Lasix  changed from daily to PRN, chart does not reflect  changes ***      {Are you ordering a CV Procedure (e.g. stress test, cath, DCCV, TEE, etc)?   Press F2        :[id number]}    Medication Adjustments/Labs and Tests Ordered: Current medicines are reviewed at length with the patient today.  Concerns regarding medicines are outlined above.  No orders of the defined types were placed in this encounter.  No orders of the defined types were placed in this encounter.   There are no Patient Instructions on file for this visit.   Signed, Miriam FORBES Shams, NP  02/12/2024 2:17 PM    [facility]

## 2024-02-14 ENCOUNTER — Ambulatory Visit: Attending: General Practice | Admitting: General Practice

## 2024-02-14 DIAGNOSIS — I739 Peripheral vascular disease, unspecified: Secondary | ICD-10-CM

## 2024-02-14 DIAGNOSIS — E785 Hyperlipidemia, unspecified: Secondary | ICD-10-CM

## 2024-02-29 ENCOUNTER — Encounter: Payer: Self-pay | Admitting: *Deleted

## 2024-03-02 ENCOUNTER — Encounter: Payer: Self-pay | Admitting: Emergency Medicine

## 2024-03-02 ENCOUNTER — Ambulatory Visit: Attending: Emergency Medicine | Admitting: Emergency Medicine

## 2024-03-02 VITALS — BP 130/70 | HR 68 | Ht 71.0 in | Wt 172.0 lb

## 2024-03-02 DIAGNOSIS — I739 Peripheral vascular disease, unspecified: Secondary | ICD-10-CM | POA: Insufficient documentation

## 2024-03-02 DIAGNOSIS — I10 Essential (primary) hypertension: Secondary | ICD-10-CM | POA: Diagnosis present

## 2024-03-02 DIAGNOSIS — I5022 Chronic systolic (congestive) heart failure: Secondary | ICD-10-CM | POA: Diagnosis present

## 2024-03-02 DIAGNOSIS — E782 Mixed hyperlipidemia: Secondary | ICD-10-CM | POA: Insufficient documentation

## 2024-03-02 NOTE — Progress Notes (Signed)
 Cardiology Office Note:    Date:  03/02/2024  ID:  Perry Rosales, DOB January 17, 1936, MRN [id number] PCP: Perry Toribio MATSU, MD  [facility] Providers Cardiologist:  Perry ONEIDA Decent, MD [facility] Cardiologist:  Perry Cage, MD       Patient Profile:       Chief Complaint: 77-month follow-up History of Present Illness:  Perry Rosales is a 88 y.o. male with visit-pertinent history of PAD, systolic heart failure with recovered ejection fraction, hypertension, hyperlipidemia, CKD stage IIIa   He was seen in the hospital for abnormal echocardiogram in the setting of fall and left hip fracture.  Echocardiogram obtained on 09/08/2022 showed EF 40-45%, regional wall abnormality in the LAD territory concerning for stress cardiomyopathy, grade 1 DD, moderately elevated PA systolic pressure.  He had no chest pain therefore he proceeded with orthopedic surgery.  He underwent diuresis and GDMT titration.  Repeat echocardiogram ordered and completed on 10/30/2022 showing EF improved to 55%, basal inferior severe hypokinesis, grade 1 DD.  The previous wall motion abnormality in the septum and apex has resolved.     He also has moderate right lower extremity disease based on ABIs.  He was evaluated by Dr. Cage on 10/27/2022 who recommended proceeding with abdominal aortogram with lower extremity angiography.  Patient underwent procedure on 11/04/2022 which revealed severe stenosis at the junction of the distal left common iliac artery and ostial left external iliac artery, flush occlusion of the left SFA with reconstitution via well-developed collaterals from the profunda and two-vessel runoff below the knee via posterior and peroneal arteries, moderate right external iliac artery stenosis.  He underwent successful angioplasty and self-expanding stent placement to the ostial left external iliac artery extending into the distal common iliac artery.  Postprocedure patient was placed on aspirin  and Plavix  with  recommendation to continue DAPT for 3 months.  ABI obtained 11/25/2022 showed right ABI 0.56, left ABI 0.52.  Vas ultrasound showed patent left iliac stent.  Most recent Doppler study 05/2023 showed ABI of 0.71 on the right and 0.65 on the left.  Duplex showed patent left iliac stent with no significant restenosis.   He was seen by Dr. Vernice on 05/19/2023.  Patient was noted to be hypotensive with blood pressure 92/60.  His metoprolol  was stopped he was to monitor blood pressure at home and follow-up in 3 months.  Patient was last seen in clinic on 08/17/2023 for follow-up.  He is doing well without acute complaints.  No further hypotensive episodes.  He was to follow-up in 6 months.   Discussed the use of AI scribe software for clinical note transcription with the patient, who gave verbal consent to proceed.  History of Present Illness Perry Rosales is an 88 year old male who presents for a 52-month follow-up visit.  Today patient is doing well overall.  He is without acute cardiovascular concerns or complaints.  Tells me he he feels the best he has felt in a very long time.  He has no symptoms such as shortness of breath, leg swelling, weight gain, lightheadedness, dizziness, orthopnea, PND or chest pain.  He has no symptoms of claudication and reports good exercise tolerance, walking 30 minutes daily without significant leg pain.  He is active, walking regularly, and has resumed hobbies such as painting. His physical activity has improved, and he has not experienced any chest pain or shortness of breath during exertion.  Review of systems:  Please see the history of present illness. All  other systems are reviewed and otherwise negative.      Studies Reviewed:        VAS US  aorta/IVC/iliacs 05/31/2023 Abdominal Aorta: There is evidence of abnormal dilatation of the mid  Abdominal aorta. The largest aortic measurement is 3.9 cm. The largest  aortic diameter remains essentially unchanged  compared to prior exam.  Previous diameter measurement was 3.8 cm  obtained on 06/31/2024.   VAS US  ABI 05/31/2023 Right: Resting right ankle-brachial index indicates moderate right lower  extremity arterial disease. The right toe-brachial index is abnormal.   Left: Resting left ankle-brachial index indicates moderate left lower  extremity arterial disease. The left toe-brachial index is abnormal.   Echocardiogram 10/30/2022 1. Left ventricular ejection fraction, by estimation, is 55%. The left  ventricle has normal function. The left ventricle demonstrates regional  wall motion abnormalities with basal inferior severe hypokinesis. Left  ventricular diastolic parameters are  consistent with Grade I diastolic dysfunction (impaired relaxation).   2. Right ventricular systolic function is normal. The right ventricular  size is normal. Tricuspid regurgitation signal is inadequate for assessing  PA pressure.   3. The mitral valve is normal in structure. No evidence of mitral valve  regurgitation. No evidence of mitral stenosis.   4. The aortic valve is tricuspid. There is moderate calcification of the  aortic valve. Aortic valve regurgitation is not visualized. Aortic valve  sclerosis/calcification is present, without any evidence of aortic  stenosis. Aortic valve mean gradient  measures 7.0 mmHg.   5. The inferior vena cava is normal in size with greater than 50%  respiratory variability, suggesting right atrial pressure of 3 mmHg.    Abdominal Aortogram 11/04/2022 1.  Severe stenosis at the junction of distal left common iliac artery and ostial left external iliac artery, flush occlusion of the left SFA with reconstitution via well-developed collaterals from the profunda and two-vessel runoff below the knee via the posterior and peroneal arteries. 2.  Moderate right external iliac artery stenosis.  Full runoff was not performed on the right lower extremity. 3.  Successful angioplasty and  self-expanding stent placement to the ostial left external iliac artery extending into the distal common iliac artery.  Difficult procedure due to tortuosity with inability to perform the procedure from the contralateral approach that was successful from the left common femoral artery approach.  Risk Assessment/Calculations:              Physical Exam:   VS:  BP 130/70 (BP Location: Left Arm, Patient Position: Sitting, Cuff Size: Normal)   Pulse 68   Ht 5' 11 (1.803 m)   Wt 172 lb (78 kg)   BMI 23.99 kg/m    Wt Readings from Last 3 Encounters:  03/02/24 172 lb (78 kg)  08/17/23 181 lb (82.1 kg)  06/01/23 180 lb (81.6 kg)    GEN: Well nourished, well developed in no acute distress NECK: No JVD; No carotid bruits CARDIAC: RRR, no murmurs, rubs, gallops RESPIRATORY:  Clear to auscultation without rales, wheezing or rhonchi  ABDOMEN: Soft, non-tender, non-distended EXTREMITIES:  No edema; No acute deformity      Assessment and Plan:  Hypertension  Blood pressure today well-controlled at 130/70 - Unable to tolerate metoprolol  due to dizziness and low blood pressure - No changes.  Blood pressure well-controlled without medications   Chronic systolic heart failure LVEF 08/2022 was 40-45% with improvement of LVEF to 55% on 10/2022.  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  Walks 30 minutes daily without limitation or exertional symptoms.  Volume status is stable.  No dyspnea, thumping, PND, leg swelling.  Weight has been stable - Continue Lasix  20 mg daily as needed   Peripheral arterial disease S/p left external iliac stenting on 10/2022 with excellent results completed for critical limb ischemia and slow healing ulceration affecting the left foot with subsequent healing of ulcer.  He still has left SFA occlusion but currently asymptomatic  - Walks 30 minutes daily without claudication - Recent  Dopplers 05/2023 were reassuring - Repeat Doppler studies scheduled for 05/2024 - Continue aspirin  81 mg daily - Managed by Dr. Darron   Hyperlipidemia, LDL goal <70 LDL 61 on 04/2023 and under excellent control - Recently drawn at PCP office.  Will ask to fax results over to office for review - Continue rosuvastatin 10 mg daily      Dispo:  Return in about 1 year (around 03/02/2025).  Signed, Lum LITTIE Louis, NP

## 2024-03-02 NOTE — Patient Instructions (Signed)
 Medication Instructions:  NO CHANGES  Lab Work: NONE TO BE DONE TODAY.  Testing/Procedures: WILL SCHEDULE ULTRASOUNDS FOR DECEMBER 2025  Follow-Up: At [facility], you and your health needs are our priority.  As part of our continuing mission to provide you with exceptional heart care, our providers are all part of one team.  This team includes your primary Cardiologist (physician) and Advanced Practice Providers or APPs (Physician Assistants and Nurse Practitioners) who all work together to provide you with the care you need, when you need it.  Your next appointment:   9-12 MONTHS  Provider:   Darryle ONEIDA Decent, MD OR

## 2024-04-25 ENCOUNTER — Emergency Department (HOSPITAL_BASED_OUTPATIENT_CLINIC_OR_DEPARTMENT_OTHER)

## 2024-04-25 ENCOUNTER — Observation Stay (HOSPITAL_BASED_OUTPATIENT_CLINIC_OR_DEPARTMENT_OTHER)
Admission: EM | Admit: 2024-04-25 | Discharge: 2024-04-26 | Disposition: A | Discharge status: Home / Self Care | Attending: Internal Medicine | Admitting: Internal Medicine

## 2024-04-25 ENCOUNTER — Encounter (HOSPITAL_BASED_OUTPATIENT_CLINIC_OR_DEPARTMENT_OTHER): Payer: Self-pay | Admitting: Emergency Medicine

## 2024-04-25 DIAGNOSIS — Z23 Encounter for immunization: Secondary | ICD-10-CM | POA: Insufficient documentation

## 2024-04-25 DIAGNOSIS — I700 Atherosclerosis of aorta: Secondary | ICD-10-CM | POA: Diagnosis not present

## 2024-04-25 DIAGNOSIS — N179 Acute kidney failure, unspecified: Secondary | ICD-10-CM | POA: Diagnosis present

## 2024-04-25 DIAGNOSIS — I739 Peripheral vascular disease, unspecified: Secondary | ICD-10-CM | POA: Insufficient documentation

## 2024-04-25 DIAGNOSIS — Z87891 Personal history of nicotine dependence: Secondary | ICD-10-CM | POA: Diagnosis not present

## 2024-04-25 DIAGNOSIS — R7989 Other specified abnormal findings of blood chemistry: Secondary | ICD-10-CM | POA: Diagnosis not present

## 2024-04-25 DIAGNOSIS — Z79899 Other long term (current) drug therapy: Secondary | ICD-10-CM | POA: Insufficient documentation

## 2024-04-25 DIAGNOSIS — I130 Hypertensive heart and chronic kidney disease with heart failure and stage 1 through stage 4 chronic kidney disease, or unspecified chronic kidney disease: Secondary | ICD-10-CM | POA: Insufficient documentation

## 2024-04-25 DIAGNOSIS — Z7982 Long term (current) use of aspirin: Secondary | ICD-10-CM | POA: Insufficient documentation

## 2024-04-25 DIAGNOSIS — R4182 Altered mental status, unspecified: Secondary | ICD-10-CM | POA: Diagnosis present

## 2024-04-25 DIAGNOSIS — T40425A Adverse effect of tramadol, initial encounter: Secondary | ICD-10-CM | POA: Diagnosis present

## 2024-04-25 DIAGNOSIS — G929 Unspecified toxic encephalopathy: Secondary | ICD-10-CM | POA: Diagnosis not present

## 2024-04-25 DIAGNOSIS — T403X5A Adverse effect of methadone, initial encounter: Secondary | ICD-10-CM | POA: Insufficient documentation

## 2024-04-25 DIAGNOSIS — Z959 Presence of cardiac and vascular implant and graft, unspecified: Secondary | ICD-10-CM | POA: Insufficient documentation

## 2024-04-25 DIAGNOSIS — N1831 Chronic kidney disease, stage 3a: Secondary | ICD-10-CM | POA: Diagnosis not present

## 2024-04-25 DIAGNOSIS — G9341 Metabolic encephalopathy: Secondary | ICD-10-CM | POA: Diagnosis not present

## 2024-04-25 DIAGNOSIS — I5032 Chronic diastolic (congestive) heart failure: Secondary | ICD-10-CM | POA: Diagnosis not present

## 2024-04-25 DIAGNOSIS — T40715A Adverse effect of cannabis, initial encounter: Principal | ICD-10-CM | POA: Insufficient documentation

## 2024-04-25 HISTORY — DX: Adverse effect of tramadol, initial encounter: T40.425A

## 2024-04-25 LAB — URINE DRUG SCREEN
Amphetamines: NEGATIVE
Barbiturates: NEGATIVE
Benzodiazepines: NEGATIVE
Cocaine: NEGATIVE
Fentanyl: NEGATIVE
Methadone Scn, Ur: POSITIVE — AB
Opiates: NEGATIVE
Tetrahydrocannabinol: POSITIVE — AB

## 2024-04-25 LAB — PRO BRAIN NATRIURETIC PEPTIDE: Pro Brain Natriuretic Peptide: 267 pg/mL (ref <300.0)

## 2024-04-25 LAB — CBC WITH DIFFERENTIAL/PLATELET
Abs Immature Granulocytes: 0.08 K/uL — ABNORMAL HIGH (ref 0.00–0.07)
Basophils Absolute: 0 K/uL (ref 0.0–0.1)
Basophils Relative: 0 %
Eosinophils Absolute: 0.1 K/uL (ref 0.0–0.5)
Eosinophils Relative: 2 %
HCT: 38.2 % — ABNORMAL LOW (ref 39.0–52.0)
Hemoglobin: 12.7 g/dL — ABNORMAL LOW (ref 13.0–17.0)
Immature Granulocytes: 1 %
Lymphocytes Relative: 6 %
Lymphs Abs: 0.6 K/uL — ABNORMAL LOW (ref 0.7–4.0)
MCH: 30.1 pg (ref 26.0–34.0)
MCHC: 33.2 g/dL (ref 30.0–36.0)
MCV: 90.5 fL (ref 80.0–100.0)
Monocytes Absolute: 0.7 K/uL (ref 0.1–1.0)
Monocytes Relative: 8 %
Neutro Abs: 8 K/uL — ABNORMAL HIGH (ref 1.7–7.7)
Neutrophils Relative %: 83 %
Platelets: 142 K/uL — ABNORMAL LOW (ref 150–400)
RBC: 4.22 MIL/uL (ref 4.22–5.81)
RDW: 11.9 % (ref 11.5–15.5)
WBC: 9.5 K/uL (ref 4.0–10.5)
nRBC: 0 % (ref 0.0–0.2)

## 2024-04-25 LAB — BASIC METABOLIC PANEL WITH GFR
Anion gap: 13 (ref 5–15)
BUN: 29 mg/dL — ABNORMAL HIGH (ref 8–23)
CO2: 28 mmol/L (ref 22–32)
Calcium: 7 mg/dL — ABNORMAL LOW (ref 8.9–10.3)
Chloride: 96 mmol/L — ABNORMAL LOW (ref 98–111)
Creatinine, Ser: 1.78 mg/dL — ABNORMAL HIGH (ref 0.61–1.24)
GFR, Estimated: 37 mL/min — ABNORMAL LOW (ref >60)
Glucose, Bld: 135 mg/dL — ABNORMAL HIGH (ref 70–99)
Potassium: 4.6 mmol/L (ref 3.5–5.1)
Sodium: 137 mmol/L (ref 135–145)

## 2024-04-25 LAB — URINALYSIS, ROUTINE W REFLEX MICROSCOPIC
Bacteria, UA: NONE SEEN
Bilirubin Urine: NEGATIVE
Glucose, UA: NEGATIVE mg/dL
Hgb urine dipstick: NEGATIVE
Ketones, ur: NEGATIVE mg/dL
Leukocytes,Ua: NEGATIVE
Nitrite: NEGATIVE
Protein, ur: 30 mg/dL — AB
Specific Gravity, Urine: 1.028 (ref 1.005–1.030)
pH: 6 (ref 5.0–8.0)

## 2024-04-25 LAB — AMMONIA: Ammonia: <13 umol/L (ref 9–35)

## 2024-04-25 LAB — TROPONIN T, HIGH SENSITIVITY
Troponin T High Sensitivity: 103 ng/L (ref 0–19)
Troponin T High Sensitivity: 115 ng/L (ref 0–19)

## 2024-04-25 LAB — RESP PANEL BY RT-PCR (RSV, FLU A&B, COVID)  RVPGX2
Influenza A by PCR: NEGATIVE
Influenza B by PCR: NEGATIVE
Resp Syncytial Virus by PCR: NEGATIVE
SARS Coronavirus 2 by RT PCR: NEGATIVE

## 2024-04-25 LAB — ALBUMIN: Albumin: 3.2 g/dL — ABNORMAL LOW (ref 3.5–5.0)

## 2024-04-25 MED ORDER — ONDANSETRON HCL 4 MG PO TABS: 4.0000 mg | ORAL_TABLET | Freq: Four times a day (QID) | ORAL | Status: DC | PRN

## 2024-04-25 MED ORDER — INFLUENZA VAC SPLIT HIGH-DOSE 0.5 ML IM SUSY
0.5000 mL | PREFILLED_SYRINGE | INTRAMUSCULAR | Status: AC
  Administered 2024-04-26: 0.5 mL via INTRAMUSCULAR
  Filled 2024-04-25: qty 0.5

## 2024-04-25 MED ORDER — AMITRIPTYLINE HCL 50 MG PO TABS
50.0000 mg | ORAL_TABLET | Freq: Every day | ORAL | Status: DC
  Administered 2024-04-25: 50 mg via ORAL
  Filled 2024-04-25: qty 1

## 2024-04-25 MED ORDER — SENNOSIDES-DOCUSATE SODIUM 8.6-50 MG PO TABS: 1.0000 | ORAL_TABLET | Freq: Every evening | ORAL | Status: DC | PRN

## 2024-04-25 MED ORDER — HEPARIN SODIUM (PORCINE) 5000 UNIT/ML IJ SOLN
5000.0000 [IU] | Freq: Three times a day (TID) | INTRAMUSCULAR | Status: DC
  Administered 2024-04-25 – 2024-04-26 (×3): 5000 [IU] via SUBCUTANEOUS
  Filled 2024-04-25 (×3): qty 1

## 2024-04-25 MED ORDER — ASPIRIN 81 MG PO TBEC
81.0000 mg | DELAYED_RELEASE_TABLET | Freq: Every day | ORAL | Status: DC
  Administered 2024-04-26: 81 mg via ORAL
  Filled 2024-04-25: qty 1

## 2024-04-25 MED ORDER — KETOROLAC TROMETHAMINE 30 MG/ML IJ SOLN
30.0000 mg | Freq: Once | INTRAMUSCULAR | Status: AC
  Administered 2024-04-25: 30 mg via INTRAVENOUS
  Filled 2024-04-25: qty 1

## 2024-04-25 MED ORDER — ONDANSETRON HCL 4 MG/2ML IJ SOLN: 4.0000 mg | Freq: Four times a day (QID) | INTRAMUSCULAR | Status: DC | PRN

## 2024-04-25 MED ORDER — SODIUM CHLORIDE 0.9 % IV BOLUS
500.0000 mL | Freq: Once | INTRAVENOUS | Status: AC
  Administered 2024-04-25: 500 mL via INTRAVENOUS

## 2024-04-25 MED ORDER — PANTOPRAZOLE SODIUM 40 MG PO TBEC
40.0000 mg | DELAYED_RELEASE_TABLET | Freq: Every day | ORAL | Status: DC
  Administered 2024-04-26: 40 mg via ORAL
  Filled 2024-04-25: qty 1

## 2024-04-25 MED ORDER — LACTATED RINGERS IV SOLN: INTRAVENOUS | Status: DC

## 2024-04-25 MED ORDER — TRAZODONE HCL 50 MG PO TABS
50.0000 mg | ORAL_TABLET | Freq: Every evening | ORAL | Status: DC | PRN
Start: 2024-04-25 — End: 2024-04-26
  Administered 2024-04-25: 50 mg via ORAL

## 2024-04-25 MED ORDER — IOHEXOL 350 MG/ML SOLN
100.0000 mL | Freq: Once | INTRAVENOUS | Status: AC | PRN
  Administered 2024-04-25: 50 mL via INTRAVENOUS

## 2024-04-25 MED ORDER — NALOXONE HCL 0.4 MG/ML IJ SOLN
0.2000 mg | Freq: Once | INTRAMUSCULAR | Status: AC
  Administered 2024-04-25: 0.2 mg via INTRAVENOUS
  Filled 2024-04-25: qty 1

## 2024-04-25 MED ORDER — ACETAMINOPHEN 500 MG PO TABS
500.0000 mg | ORAL_TABLET | Freq: Four times a day (QID) | ORAL | Status: DC | PRN
  Administered 2024-04-25 – 2024-04-26 (×2): 1000 mg via ORAL
  Filled 2024-04-25 (×2): qty 2

## 2024-04-25 MED ORDER — SODIUM CHLORIDE 0.9% FLUSH
3.0000 mL | Freq: Two times a day (BID) | INTRAVENOUS | Status: DC
  Administered 2024-04-25 – 2024-04-26 (×2): 3 mL via INTRAVENOUS

## 2024-04-25 MED ORDER — CALCIUM GLUCONATE-NACL 1-0.675 GM/50ML-% IV SOLN
1.0000 g | Freq: Once | INTRAVENOUS | Status: AC
  Administered 2024-04-26: 1000 mg via INTRAVENOUS
  Filled 2024-04-25: qty 50

## 2024-04-25 MED ORDER — ROSUVASTATIN CALCIUM 5 MG PO TABS
10.0000 mg | ORAL_TABLET | Freq: Every day | ORAL | Status: DC
  Administered 2024-04-25: 10 mg via ORAL
  Filled 2024-04-25: qty 1
  Filled 2024-04-25: qty 2

## 2024-04-25 NOTE — Plan of Care (Signed)
  Problem: Education: Goal: Knowledge of General Education information will improve Description: Including pain rating scale, medication(s)/side effects and non-pharmacologic comfort measures 04/25/2024 1900 by Casimir Keturah LABOR, RN Outcome: Progressing 04/25/2024 1845 by Casimir Keturah LABOR, RN Outcome: Progressing   Problem: Health Behavior/Discharge Planning: Goal: Ability to manage health-related needs will improve 04/25/2024 1900 by Casimir Keturah LABOR, RN Outcome: Progressing 04/25/2024 1845 by Casimir Keturah LABOR, RN Outcome: Progressing   Problem: Clinical Measurements: Goal: Ability to maintain clinical measurements within normal limits will improve 04/25/2024 1900 by Casimir Keturah LABOR, RN Outcome: Progressing 04/25/2024 1845 by Casimir Keturah LABOR, RN Outcome: Progressing Goal: Will remain free from infection 04/25/2024 1900 by Casimir Keturah LABOR, RN Outcome: Progressing 04/25/2024 1845 by Casimir Keturah LABOR, RN Outcome: Progressing Goal: Diagnostic test results will improve 04/25/2024 1900 by Casimir Keturah LABOR, RN Outcome: Progressing 04/25/2024 1845 by Casimir Keturah LABOR, RN Outcome: Progressing Goal: Respiratory complications will improve 04/25/2024 1900 by Casimir Keturah LABOR, RN Outcome: Progressing 04/25/2024 1845 by Casimir Keturah LABOR, RN Outcome: Progressing Goal: Cardiovascular complication will be avoided 04/25/2024 1900 by Casimir Keturah LABOR, RN Outcome: Progressing 04/25/2024 1845 by Casimir Keturah LABOR, RN Outcome: Progressing   Problem: Activity: Goal: Risk for activity intolerance will decrease 04/25/2024 1900 by Casimir Keturah LABOR, RN Outcome: Progressing 04/25/2024 1845 by Casimir Keturah LABOR, RN Outcome: Progressing   Problem: Nutrition: Goal: Adequate nutrition will be maintained 04/25/2024 1900 by Casimir Keturah LABOR, RN Outcome: Progressing 04/25/2024 1845 by Casimir Keturah LABOR, RN Outcome: Progressing   Problem: Coping: Goal: Level  of anxiety will decrease 04/25/2024 1900 by Casimir Keturah LABOR, RN Outcome: Progressing 04/25/2024 1845 by Casimir Keturah LABOR, RN Outcome: Progressing   Problem: Elimination: Goal: Will not experience complications related to bowel motility 04/25/2024 1900 by Casimir Keturah LABOR, RN Outcome: Progressing 04/25/2024 1845 by Casimir Keturah LABOR, RN Outcome: Progressing Goal: Will not experience complications related to urinary retention 04/25/2024 1900 by Casimir Keturah LABOR, RN Outcome: Progressing 04/25/2024 1845 by Casimir Keturah LABOR, RN Outcome: Progressing   Problem: Pain Managment: Goal: General experience of comfort will improve and/or be controlled 04/25/2024 1900 by Casimir Keturah LABOR, RN Outcome: Progressing 04/25/2024 1845 by Casimir Keturah LABOR, RN Outcome: Progressing   Problem: Safety: Goal: Ability to remain free from injury will improve 04/25/2024 1900 by Casimir Keturah LABOR, RN Outcome: Progressing 04/25/2024 1845 by Casimir Keturah LABOR, RN Outcome: Progressing   Problem: Skin Integrity: Goal: Risk for impaired skin integrity will decrease 04/25/2024 1900 by Casimir Keturah LABOR, RN Outcome: Progressing 04/25/2024 1845 by Casimir Keturah LABOR, RN Outcome: Progressing

## 2024-04-25 NOTE — Plan of Care (Signed)

## 2024-04-25 NOTE — ED Provider Notes (Signed)
 [place] EMERGENCY DEPARTMENT AT [facility] Provider Note   CSN: [id number] Arrival date & time: 04/25/24  1046     Patient presents with: Altered Mental Status   Perry Rosales is a 88 y.o. male with history hypertension chronic kidney disease, systolic heart failure, cardiomyopathy, presenting to the ED in the company of his son with concern for altered mental status.  His son reports that he lives with the patient.  The patient is typically extremely lucid, independent, hikes around with his walking sticks.  Last night the patient took his typical tramadol  at night which he takes for neck pain.  He also takes trazodone  at bedtime.  He was speaking well around 9 PM, only having pain in his neck, which is typical.  They became progressively confused later in the evening including this morning.  His son was concerned that perhaps the patient may have taken accidentally too many tramadol  tablets, but feels that this should have washed out of the patient's system and by now, the patient remains confused, with mumbled speech and mixing up words.  This is atypical for him.  The patient does appear pleasantly confused and is level 5 caveat at this time, but denies to me that he is having a headache or any neck pain, and says that he has no pain when he is lying still.  He denies any chest pain or pressure.  Update, discussed urine drug screening with the patient and his son.  The son reports that he did give the patient a sliver of his THC gummy 5 days ago to try to help with pain.  He is insisting the patient does not regularly take marijuana or THC products, there is no chance the patient may have inadvertently taken some last night.   HPI     Prior to Admission medications   Medication Sig Start Date End Date Taking? Authorizing Provider  traMADol  (ULTRAM ) 50 MG tablet Take 50 mg by mouth 3 (three) times daily. *1 tablet in the morning, 1 tablet at lunch and 2 tablets at bedtime*  11/13/22  Yes [provider]  acetaminophen  (TYLENOL ) 500 MG tablet Take 500-1,000 mg by mouth every 6 (six) hours as needed for moderate pain (pain score 4-6).    [provider]  amitriptyline  (ELAVIL ) 50 MG tablet Take 1 tablet (50 mg total) by mouth at bedtime. 09/17/22   Angiulli, Toribio PARAS, PA-C  aspirin  EC 81 MG tablet Take 81 mg by mouth daily. Swallow whole.    [provider]  calcium carbonate (TUMS - DOSED IN MG ELEMENTAL CALCIUM) 500 MG chewable tablet Chew 2-3 tablets by mouth daily as needed for indigestion or heartburn.    [provider]  furosemide  (LASIX ) 20 MG tablet TAKE 1 TABLET EVERY DAY 09/15/23   O'Neal, Darryle Ned, MD  omeprazole  (PRILOSEC) 20 MG capsule Take 1 capsule (20 mg total) by mouth daily. 09/17/22   Angiulli, Toribio PARAS, PA-C  polyethylene glycol (MIRALAX  / GLYCOLAX ) 17 g packet Take 17 g by mouth daily. 09/13/22   Drusilla Sabas RAMAN, MD  potassium chloride  (KLOR-CON ) 10 MEQ tablet TAKE 1 TABLET EVERY DAY 09/15/23   O'Neal, Darryle Ned, MD  rosuvastatin (CRESTOR) 10 MG tablet Take 10 mg by mouth daily.    [provider]  tamsulosin  (FLOMAX ) 0.4 MG CAPS capsule Take 1 capsule (0.4 mg total) by mouth in the morning and at bedtime. 09/17/22   Angiulli, Toribio PARAS, PA-C    Allergies: Patient has  no known allergies.    Review of Systems  Updated Vital Signs BP 137/67   Pulse 87   Temp 98.3 F (36.8 C) (Oral)   Resp 16   SpO2 98%   Physical Exam Constitutional:      General: He is not in acute distress. HENT:     Head: Normocephalic and atraumatic.  Eyes:     Conjunctiva/sclera: Conjunctivae normal.     Pupils: Pupils are equal, round, and reactive to light.  Cardiovascular:     Rate and Rhythm: Normal rate and regular rhythm.  Pulmonary:     Effort: Pulmonary effort is normal. No respiratory distress.     Comments: 84% O2 sat on room air, 92% on 3L [place] Difficult auscultate breath sounds; no evident respiratory  distress Abdominal:     General: There is no distension.     Tenderness: There is no abdominal tenderness.  Skin:    General: Skin is warm and dry.  Neurological:     Mental Status: He is alert.     Comments: Intermittent mumbled speech, mildly slurred, but then clearing up.  No evidence of cranial nerve deficits.  No obvious unilateral weakness of the upper or lower extremities.     (all labs ordered are listed, but only abnormal results are displayed) Labs Reviewed  BASIC METABOLIC PANEL WITH GFR - Abnormal; Notable for the following components:      Result Value   Chloride 96 (*)    Glucose, Bld 135 (*)    BUN 29 (*)    Creatinine, Ser 1.78 (*)    Calcium 7.0 (*)    GFR, Estimated 37 (*)    All other components within normal limits  CBC WITH DIFFERENTIAL/PLATELET - Abnormal; Notable for the following components:   Hemoglobin 12.7 (*)    HCT 38.2 (*)    Platelets 142 (*)    Neutro Abs 8.0 (*)    Lymphs Abs 0.6 (*)    Abs Immature Granulocytes 0.08 (*)    All other components within normal limits  URINALYSIS, ROUTINE W REFLEX MICROSCOPIC - Abnormal; Notable for the following components:   Protein, ur 30 (*)    All other components within normal limits  URINE DRUG SCREEN - Abnormal; Notable for the following components:   Tetrahydrocannabinol POSITIVE (*)    Methadone Scn, Ur POSITIVE (*)    All other components within normal limits  TROPONIN T, HIGH SENSITIVITY - Abnormal; Notable for the following components:   Troponin T High Sensitivity 103 (*)    All other components within normal limits  TROPONIN T, HIGH SENSITIVITY - Abnormal; Notable for the following components:   Troponin T High Sensitivity 115 (*)    All other components within normal limits  RESP PANEL BY RT-PCR (RSV, FLU A&B, COVID)  RVPGX2  PRO BRAIN NATRIURETIC PEPTIDE  AMMONIA  URINE DRUG SCREEN    EKG: EKG Interpretation Date/Time:  Tuesday April 25 2024 13:41:34 EDT Ventricular Rate:  91 PR  Interval:  117 QRS Duration:  118 QT Interval:  382 QTC Calculation: 470 R Axis:   83  Text Interpretation: Sinus rhythm Borderline short PR interval Nonspecific intraventricular conduction delay Confirmed by Cottie Cough [phone]) on 04/25/2024 1:42:43 PM  Radiology: CT Angio Chest PE W and/or Wo Contrast Result Date: 04/25/2024 CLINICAL DATA:  Concern for pulmonary embolism. EXAM: CT ANGIOGRAPHY CHEST WITH CONTRAST TECHNIQUE: Multidetector CT imaging of the chest was performed using the standard protocol during bolus administration of intravenous contrast. Multiplanar  CT image reconstructions and MIPs were obtained to evaluate the vascular anatomy. RADIATION DOSE REDUCTION: This exam was performed according to the departmental dose-optimization program which includes automated exposure control, adjustment of the mA and/or kV according to patient size and/or use of iterative reconstruction technique. CONTRAST:  50mL OMNIPAQUE IOHEXOL 350 MG/ML SOLN COMPARISON:  Chest radiograph dated 04/25/2024 and CT dated 12/05/2021. FINDINGS: Cardiovascular: There is no cardiomegaly or pericardial effusion. Three-vessel coronary vascular calcification. Moderate calcified and noncalcified plaque of the thoracic aorta. No abnormal dilatation or dissection. No pulmonary artery embolus identified. Mediastinum/Nodes: No hilar or mediastinal adenopathy. The esophagus and thyroid gland are grossly unremarkable. No mediastinal fluid collection. Lungs/Pleura: No focal consolidation, pleural effusion, pneumothorax. The central airways are patent. Upper Abdomen: Cholecystectomy. Musculoskeletal: No acute osseous pathology. T11 compression fracture as seen on the prior CT. Review of the MIP images confirms the above findings. IMPRESSION: 1. No acute intrathoracic pathology. No CT evidence of pulmonary artery embolus. 2. Three-vessel coronary vascular calcification. 3.  Aortic Atherosclerosis (ICD10-I70.0). Electronically Signed    By: Vanetta Chou M.D.   On: 04/25/2024 14:29   CT Head Wo Contrast Result Date: 04/25/2024 EXAM: CT HEAD WITHOUT CONTRAST 04/25/2024 12:03:46 PM TECHNIQUE: CT of the head was performed without the administration of intravenous contrast. Automated exposure control, iterative reconstruction, and/or weight based adjustment of the mA/kV was utilized to reduce the radiation dose to as low as reasonably achievable. COMPARISON: None available. CLINICAL HISTORY: Mental status change, unknown cause. Triage note: Reports confusion and disorient starting at 2145 last night. Family states patient has been taking tramadol  for a pinched nerve in back. Family concerned he might have taken too much Family states patient normally is A\T\Ox4. Unable to answer questions or follow commands in triage. FINDINGS: BRAIN AND VENTRICLES: No acute hemorrhage. No evidence of acute infarct. No hydrocephalus. No extra-axial collection. No mass effect or midline shift. Age-related atrophy. Mild periventricular and deep white matter hypodensity typical of chronic small vessel ischemia. Atherosclerosis of skullbase vasculature without hyperdense vessel or abnormal calcification. ORBITS: Bilateral cataract resection. SINUSES: Mucosal thickening throughout ethmoid air cells. SOFT TISSUES AND SKULL: No acute soft tissue abnormality. No skull fracture. IMPRESSION: 1. No acute intracranial abnormality. 2. Age-related atrophy and mild periventricular and deep white matter hypodensity typical of chronic small vessel ischemia. Electronically signed by: Evalene Coho MD 04/25/2024 12:30 PM EDT RP Workstation: [id number]   DG Chest Portable 1 View Result Date: 04/25/2024 CLINICAL DATA:  Possible aspiration.  Hypoxia. EXAM: PORTABLE CHEST 1 VIEW COMPARISON:  09/08/2022 FINDINGS: Lungs are hypoinflated without acute airspace consolidation or effusion. Cardiomediastinal silhouette and remainder of the exam is unchanged. IMPRESSION:  Hypoinflation without acute cardiopulmonary disease. Electronically Signed   By: Toribio Agreste M.D.   On: 04/25/2024 12:15     Procedures   Medications Ordered in the ED  naloxone [facility]) injection 0.2 mg (has no administration in time range)  ketorolac (TORADOL) 30 MG/ML injection 30 mg (30 mg Intravenous Given 04/25/24 1319)  sodium chloride  0.9 % bolus 500 mL (500 mLs Intravenous New Bag/Given 04/25/24 1347)  iohexol (OMNIPAQUE) 350 MG/ML injection 100 mL (50 mLs Intravenous Contrast Given 04/25/24 1409)    Clinical Course as of 04/25/24 1526  Tue Apr 25, 2024  1341 Troponin T High Sensitivity(!!): 103 [MT]  1452 I spoke to Dr [facility] cardiology who feels patient is table for hospitalist admission -no immediate indication for heparin  or catheterization, unless the patient develops chest pain, concerning EKG changes, or significant troponin elevation.  This mild elevation may be related to demand ischemia. [MT]  1509 Admitted to hospitalist who requested small dose narcan... we'll attempt [MT]    Clinical Course User Index [MT] Hilja Kintzel, Donnice PARAS, MD                                 Medical Decision Making Amount and/or Complexity of Data Reviewed Labs: ordered. Decision-making details documented in ED Course. Radiology: ordered. ECG/medicine tests: ordered.  Risk Prescription drug management. Decision regarding hospitalization.   This patient presents to the Emergency Department with complaint of altered mental status.  This involves an extensive number of treatment options, and is a complaint that carries with it a high risk of complications and morbidity.  The differential diagnosis includes hypoglycemia vs metabolic encephalopathy vs infection (including cystitis) vs ICH vs stroke vs polypharmacy vs other  I ordered, reviewed, and interpreted labs, including minor elevation of troponins, but relatively flat on repeat, which may be related demand ischemia.  Also minor  elevation of BUN and creatinine.  UDS is positive for THC and methadone. I ordered medication small dose Narcan for potential narcosis with hypoxia, small fluid bolus for elevation of BUN and creatinine I ordered imaging studies which included CT head, CT PE study I independently visualized and interpreted imaging which showed no emergent finding and the monitor tracing which showed this rhythm Additional history was obtained from patient's son at bedside I personally reviewed the patients ECG which showed sinus rhythm with no acute ischemic findings  I discussed case with cardiology by phone.  Please see ED course.  After the interventions stated above, I reevaluated the patient and found waxing and waning mental status, but during more cognitively lucid periods, the patient is making sense.  His speech continues to be intermittently slurred as well.  I think it is reasonable to admit him at this point, consideration of MRI of the brain, and also for monitoring of his hypoxia, which is not of a clear etiology.  No clear evidence of congestive heart failure, [place] edema, pneumothorax, PE, or pneumonia.      Final diagnoses:  Altered mental status, unspecified altered mental status type  Elevated troponin    ED Discharge Orders     None          Cottie Donnice PARAS, MD 04/25/24 1526

## 2024-04-25 NOTE — ED Triage Notes (Signed)
 Reports confusion and disorient starting at 2145 last night. Family states patient has been taking tramadol  for a pinched nerve in back. Family concerned he might have taken too much     Family states patient normally is A&Ox4. Unable to answer questions or follow commands in triage.

## 2024-04-25 NOTE — H&P (Addendum)
 History and Physical    Perry Rosales FMW:[id number] DOB: [birth year] DOA: 04/25/2024  PCP: Yolande Toribio MATSU, MD  Patient coming from: Home  I have personally briefly reviewed patient's old medical records in [facility]  Chief Complaint: Altered mental status  HPI: Perry Rosales is a 88 y.o. male with medical history significant for chronic HFpEF with suspected stress-induced cardiomyopathy at time of right hip fracture (08/2022), PAD s/p left external iliac artery stenting (10/2022), CKD stage IIIa, HLD who presented to the ED for evaluation of altered mental status.  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  He takes tramadol  50 mg 2-3 times per day for pain control.  He reports he was having some nerve pain in his neck yesterday and admits that he did take a couple extra tramadol  than he usually does.  Yesterday evening he became progressively confused which persisted through this morning.  He was having mumbled speech and mixing up his words today which prompted family to bring him to the ED for further evaluation.  Per ED documentation, after discussion with his son the son did report that he gave the patient a sliver of THC gummy 5 days ago to try to help with pain but states that patient does not take marijuana or THC products regularly and did not take any extra last night.  Patient seen on transfer to the floor.  Mentation is significantly improved, he feels back to his baseline.  He denies any recent chest pain, dyspnea, focal weakness.  He is fully alert and oriented.  [facility] ED Course  Labs/Imaging on admission: I have personally reviewed following labs and imaging studies.  Initial vitals showed BP 161/100, pulse 94, RR 18, temp 98.3 F, SpO2 92% on room air.  Per EDP documentation SpO2 dropped to 84% while on room air and improved to 92% on 3 L supplemental O2 via [place].  Labs showed WBC 9.5, hemoglobin 12.7, platelets 142,  sodium 137, potassium 4.6, bicarb 28, BUN 29, creatinine 1.78, serum glucose 135, proBNP 267, ammonia <13, troponin T 103 > 115.  UDS positive for methadone and THC.  UA negative for UTI.  SARS-CoV-2, influenza, RSV PCR negative.  CT head without contrast negative for acute intracranial abnormality.  CTA chest negative for acute intrathoracic pathology.  No CT evidence of pulmonary artery embolus.  Three-vessel coronary vascular calcification and aortic atherosclerosis noted.  Patient was given 500 cc normal saline, IV Toradol 30 mg, IV Narcan 0.2 mg.  EDP spoke with cardiology Dr. Francyne, no immediate indication for heparin  or catheterization unless patient develops chest pain, concern EKG changes, or significant troponin elevation.  The hospitalist service was consulted for admission.  Review of Systems: All systems reviewed and are negative except as documented in history of present illness above.   Past Medical History:  Diagnosis Date   CHF (congestive heart failure) (HCC)    GERD (gastroesophageal reflux disease)    Heart murmur    Hypertension     Past Surgical History:  Procedure Laterality Date   ABDOMINAL AORTOGRAM W/LOWER EXTREMITY N/A 11/04/2022   Procedure: ABDOMINAL AORTOGRAM W/LOWER EXTREMITY;  Surgeon: Darron Deatrice LABOR, MD;  Location: [facility] INVASIVE CV LAB;  Service: Cardiovascular;  Laterality: N/A;   CATARACT EXTRACTION W/ INTRAOCULAR LENS  IMPLANT, BILATERAL     CHOLECYSTECTOMY     COLONOSCOPY     PERIPHERAL VASCULAR INTERVENTION Left 11/04/2022   Procedure: PERIPHERAL VASCULAR INTERVENTION;  Surgeon: Darron Deatrice  A, MD;  Location: [facility] INVASIVE CV LAB;  Service: Cardiovascular;  Laterality: Left;  External Illiac   TOTAL HIP ARTHROPLASTY Left 09/10/2022   Procedure: TOTAL HIP REPLACEMENT;  Surgeon: Edna Toribio LABOR, MD;  Location: [facility] OR;  Service: Orthopedics;  Laterality: Left;    Social History: Social History   Tobacco Use   Smoking status: Former     Current packs/day: 0.00    Average packs/day: 0.5 packs/day for 60.0 years (30.0 ttl pk-yrs)    Types: Cigarettes    Start date: 89    Quit date: 2022    Years since quitting: 3.8    Passive exposure: Current   Smokeless tobacco: Never  Vaping Use   Vaping status: Never Used  Substance Use Topics   Alcohol  use: No   Drug use: No   No Known Allergies  Family History  Problem Relation Age of Onset   Colon cancer Neg Hx    Esophageal cancer Neg Hx    Rectal cancer Neg Hx    Stomach cancer Neg Hx      Prior to Admission medications   Medication Sig Start Date End Date Taking? Authorizing Provider  acetaminophen  (TYLENOL ) 500 MG tablet Take 500-1,000 mg by mouth every 6 (six) hours as needed for moderate pain (pain score 4-6).   Yes [provider]  amitriptyline  (ELAVIL ) 50 MG tablet Take 1 tablet (50 mg total) by mouth at bedtime. 09/17/22  Yes Angiulli, Toribio PARAS, PA-C  aspirin  EC 81 MG tablet Take 81 mg by mouth daily. Swallow whole.   Yes [provider]  calcium carbonate (TUMS - DOSED IN MG ELEMENTAL CALCIUM) 500 MG chewable tablet Chew 2-3 tablets by mouth daily as needed for indigestion or heartburn.   Yes [provider]  furosemide  (LASIX ) 20 MG tablet TAKE 1 TABLET EVERY DAY 09/15/23  Yes O'Neal, Darryle Ned, MD  omeprazole  (PRILOSEC) 20 MG capsule Take 1 capsule (20 mg total) by mouth daily. 09/17/22  Yes Angiulli, Toribio PARAS, PA-C  potassium chloride  (KLOR-CON ) 10 MEQ tablet TAKE 1 TABLET EVERY DAY 09/15/23  Yes O'Neal, Darryle Ned, MD  rosuvastatin (CRESTOR) 10 MG tablet Take 10 mg by mouth daily.   Yes [provider]  tamsulosin  (FLOMAX ) 0.4 MG CAPS capsule Take 1 capsule (0.4 mg total) by mouth in the morning and at bedtime. 09/17/22  Yes Angiulli, Toribio PARAS, PA-C  traMADol  (ULTRAM ) 50 MG tablet Take 50 mg by mouth 3 (three) times daily. *1 tablet in the morning, 1 tablet at lunch and 2 tablets at bedtime* 11/13/22  Yes [provider]  polyethylene glycol (MIRALAX  / GLYCOLAX ) 17 g packet Take 17 g by mouth daily. Patient not taking: Reported on 04/25/2024 09/13/22   Drusilla Sabas RAMAN, MD    Physical Exam: Vitals:   04/25/24 1706 04/25/24 1836 04/25/24 1850 04/25/24 2005  BP:  (!) 162/64  121/75  Pulse:  83  84  Resp:  18  16  Temp: 98.3 F (36.8 C) 98.1 F (36.7 C)  98.2 F (36.8 C)  TempSrc: Oral Oral    SpO2:  93%  99%  Weight:   78.4 kg   Height:   5' 11 (1.803 m)    Constitutional: Seen up in bed eating dinner.  NAD, calm, comfortable Eyes: EOMI, lids and conjunctivae normal ENMT: Mucous membranes are moist. Posterior pharynx clear of any exudate or lesions.Normal dentition.  Neck: normal, supple, no masses. Respiratory: clear to auscultation bilaterally, no wheezing, no crackles.  Normal respiratory effort. No accessory muscle use.  Cardiovascular: Regular rate and rhythm, no murmurs / rubs / gallops. No extremity edema. 2+ pedal pulses. Abdomen: no tenderness, no masses palpated. Musculoskeletal: no clubbing / cyanosis. No joint deformity upper and lower extremities. Good ROM, no contractures. Normal muscle tone.  Skin: no rashes, lesions, ulcers. No induration Neurologic: Sensation intact. Strength 5/5 in all 4.  Psychiatric: Normal judgment and insight. Alert and oriented x 3. Normal mood.   EKG: Personally reviewed. Sinus rhythm, rate 91, no acute ischemic changes.  Assessment/Plan Principal Problem:   Acute metabolic encephalopathy Active Problems:   Acute kidney injury superimposed on chronic kidney disease   PAD (peripheral artery disease)   Chronic heart failure with preserved ejection fraction (HFpEF, >= 50%) (HCC)   Adverse effect of tramadol    Perry Rosales is a 88 y.o. male with medical history significant for chronic HFpEF with suspected stress-induced cardiomyopathy at time of right hip fracture (08/2022), PAD s/p left external iliac artery stenting (10/2022), CKD stage IIIa,  HLD who is admitted with acute toxic encephalopathy secondary to tramadol  use.  Assessment and Plan: Acute toxic encephalopathy and hypoxia secondary to adverse effect of tramadol : History and presentation consistent with acute encephalopathy secondary to tramadol  use with decreased renal clearance in the setting of AKI on CKD.  Per ED documentation he was hypoxic to 84% on room air in the ED, improved on 3 L O2 via [place].  Also likely from respiratory depression due to tramadol .  Mentation improved after receiving IV Narcan prior to arrival.  He is back to baseline, fully alert and oriented on arrival to the floor. - Discussed with patient that tramadol  should be renal dosed, limit to every 12 hours and avoid excess use  Acute kidney injury superimposed on CKD stage IIIa: Creatinine 1.78 on admission compared to previous baseline ~1.3-1.4.  Hold home Lasix .  Gentle IV fluid hydration overnight and repeat labs in AM.  Chronic HFpEF/Elevated troponin: Patient is felt to have had stress-induced cardiomyopathy at time of his right hip fracture March 2024.  EF normalized to 55% on TTE 10/2022.  Mild troponin elevation 103 > 115 noted.  He has not had any chest pain or significant EKG changes.  He does not appear to have any evidence of acute CHF or volume overload. - Holding home Lasix  - Update echocardiogram - Keep on telemetry  Hypocalcemia: IV supplement ordered.  PAD s/p left external iliac artery stenting 10/2022: Continue aspirin  and rosuvastatin.   DVT prophylaxis: heparin  injection 5,000 Units Start: 04/25/24 2200 Code Status: Full code, confirmed with patient on admission Family Communication: Discussed with patient, he has discussed with family Disposition Plan: From home and likely discharge to home Consults called: None Severity of Illness: The appropriate patient status for this patient is INPATIENT. Inpatient status is judged to be reasonable and necessary in order to provide the  required intensity of service to ensure the patient's safety. The patient's presenting symptoms, physical exam findings, and initial radiographic and laboratory data in the context of their chronic comorbidities is felt to place them at high risk for further clinical deterioration. Furthermore, it is not anticipated that the patient will be medically stable for discharge from the hospital within 2 midnights of admission.   * I certify that at the point of admission it is my clinical judgment that the patient will require inpatient hospital care spanning beyond 2 midnights from the point of admission due to high intensity of service, high  risk for further deterioration and high frequency of surveillance required.Perry Rosales Jorie Blanch MD [facility]  If 7PM-7AM, please contact night-coverage [email or web address]  04/25/2024, 8:50 PM

## 2024-04-25 NOTE — Hospital Course (Signed)
 Perry Rosales is a 88 y.o. 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

## 2024-04-25 NOTE — ED Notes (Signed)
[facility] here to transport pt 

## 2024-04-25 NOTE — Consult Note (Signed)
 Cardiology Consultation   Patient ID: Perry Rosales MRN: [id number]; DOB: Aug 17, 1935  Admit date: 04/25/2024 Date of Consult: 04/25/2024  PCP:  Yolande Toribio MATSU, MD   [facility] Providers Cardiologist:  Darryle ONEIDA Decent, MD  [facility] Cardiologist:  Deatrice Cage, MD       Patient Profile: Perry Rosales is a 88 y.o. male with a hx of hypertension, peripheral vascular disease s/p stent to the left iliac artery, and CHF  who is being seen 04/25/2024 for the evaluation of elevated troponin at the request of the medicine team.  History of Present Illness: Perry Rosales presented to a local urgent care center for worsening dizziness.  Per the patient, he has a history of dizziness for the past several years.  He states the dizziness mostly occurs with abrupt moving.  Over the past couple of days, he has noted worsening dizziness that primarily occurs with standing.  This prompted a visit for further evaluation.  In the ER, a troponin was obtained, which was elevated.  He was transferred to [facility] for further evaluation.  Patient has endorsed a tingling sensation for the past year whenever he exerts himself.  He can walk over 30 feet without reproducing symptoms.  This sensation is not debilitating for him, and has not worsened over the past year.  He states he last had an episode 3 days ago.  He has no other associated symptoms whenever this sensation occurs.  Review of systems is negative for this of breath, syncope, orthopnea, paroxysmal nocturnal dyspnea, leg swelling.  Patient's cardiovascular risk factors include hypertension, peripheral vascular disease, and advanced age.  He states he may have gotten a stress test a long time ago, but cannot remember.  He a history of severe severe peripheral vascular disease, and underwent a stent to the left iliac artery in 2024.  Recent echocardiogram in 10/2022 showed an EF of 55%, grade 1 diastolic dysfunction, normal RV function, and mild  valvular disease.  During my encounter, patient was not endorsing any symptoms.  Most notable labs include high-sensitivity troponin 103-> 115, serum creatinine 1.78, hemoglobin 12.7.  CG shows sinus rhythm.   Past Medical History:  Diagnosis Date   CHF (congestive heart failure) (HCC)    GERD (gastroesophageal reflux disease)    Heart murmur    Hypertension     Past Surgical History:  Procedure Laterality Date   ABDOMINAL AORTOGRAM W/LOWER EXTREMITY N/A 11/04/2022   Procedure: ABDOMINAL AORTOGRAM W/LOWER EXTREMITY;  Surgeon: Cage Deatrice LABOR, MD;  Location: [facility] INVASIVE CV LAB;  Service: Cardiovascular;  Laterality: N/A;   CATARACT EXTRACTION W/ INTRAOCULAR LENS  IMPLANT, BILATERAL     CHOLECYSTECTOMY     COLONOSCOPY     PERIPHERAL VASCULAR INTERVENTION Left 11/04/2022   Procedure: PERIPHERAL VASCULAR INTERVENTION;  Surgeon: Cage Deatrice LABOR, MD;  Location: [facility] INVAS[facility] CV [facility];  Service: Cardiovascular;  Laterality: Left;  External Illiac   TOTAL HIP ARTHROPLASTY Left 09/10/2022   Procedure: TOTAL HIP REPLACEMENT;  Surgeon: Edna Toribio LABOR, MD;  Location: [facility] OR;  Service: Orthopedics;  Laterality: Left;     Home Medications:  Prior to Admission medications   Medication Sig Start Date End Date Taking? Authorizing Provider  acetaminophen  (TYLENOL ) 500 MG tablet Take 500-1,000 mg by mouth every 6 (six) hours as needed for moderate pain (pain score 4-6).   Yes [provider]  amitriptyline  (ELAVIL ) 50 MG tablet Take 1 tablet (50 mg total) by mouth at bedtime. 09/17/22  Yes Angiulli,  Toribio PARAS, PA-C  aspirin  EC 81 MG tablet Take 81 mg by mouth daily. Swallow whole.   Yes [provider]  calcium carbonate (TUMS - DOSED IN MG ELEMENTAL CALCIUM) 500 MG chewable tablet Chew 2-3 tablets by mouth daily as needed for indigestion or heartburn.   Yes [provider]  furosemide  (LASIX ) 20 MG tablet TAKE 1 TABLET EVERY DAY 09/15/23  Yes O'Neal, Darryle Ned, MD   omeprazole  (PRILOSEC) 20 MG capsule Take 1 capsule (20 mg total) by mouth daily. 09/17/22  Yes Angiulli, Toribio PARAS, PA-C  potassium chloride  (KLOR-CON ) 10 MEQ tablet TAKE 1 TABLET EVERY DAY 09/15/23  Yes O'Neal, Darryle Ned, MD  rosuvastatin (CRESTOR) 10 MG tablet Take 10 mg by mouth daily.   Yes [provider]  tamsulosin  (FLOMAX ) 0.4 MG CAPS capsule Take 1 capsule (0.4 mg total) by mouth in the morning and at bedtime. 09/17/22  Yes Angiulli, Toribio PARAS, PA-C  traMADol  (ULTRAM ) 50 MG tablet Take 50 mg by mouth 3 (three) times daily. *1 tablet in the morning, 1 tablet at lunch and 2 tablets at bedtime* 11/13/22  Yes [provider]  polyethylene glycol (MIRALAX  / GLYCOLAX ) 17 g packet Take 17 g by mouth daily. Patient not taking: Reported on 04/25/2024 09/13/22   Drusilla Sabas RAMAN, MD    Scheduled Meds:  amitriptyline   50 mg Oral QHS   [START ON 04/26/2024] aspirin  EC  81 mg Oral Daily   heparin   5,000 Units Subcutaneous Q8H   [START ON 04/26/2024] Influenza vac split trivalent PF  0.5 mL Intramuscular Tomorrow-1000   [START ON 04/26/2024] pantoprazole   40 mg Oral Daily   rosuvastatin  10 mg Oral QHS   sodium chloride  flush  3 mL Intravenous Q12H   Continuous Infusions:  lactated ringers      PRN Meds: acetaminophen , ondansetron  **OR** ondansetron  (ZOFRAN ) IV, senna-docusate  Allergies:   No Known Allergies  Social History:   Social History   Socioeconomic History   Marital status: Married    Spouse name: Not on file   Number of children: Not on file   Years of education: Not on file   Highest education level: Not on file  Occupational History   Not on file  Tobacco Use   Smoking status: Former    Current packs/day: 0.00    Average packs/day: 0.5 packs/day for 60.0 years (30.0 ttl pk-yrs)    Types: Cigarettes    Start date: 11    Quit date: 2022    Years since quitting: 3.8    Passive exposure: Current   Smokeless tobacco: Never  Vaping Use   Vaping status:  Never Used  Substance and Sexual Activity   Alcohol  use: No   Drug use: No   Sexual activity: Not Currently    Partners: Female  Other Topics Concern   Not on file  Social History Narrative   Not on file   Social Drivers of Health   Financial Resource Strain: Not on file  Food Insecurity: No Food Insecurity (04/25/2024)   Hunger Vital Sign    Worried About Running Out of Food in the Last Year: Never true    Ran Out of Food in the Last Year: Never true  Transportation Needs: No Transportation Needs (04/25/2024)   PRAPARE - Administrator, Civil Service (Medical): No    Lack of Transportation (Non-Medical): No  Physical Activity: Not on file  Stress: Not on file  Social Connections: Moderately Integrated (04/25/2024)  Social Connection and Isolation Panel    Frequency of Communication with Friends and Family: More than three times a week    Frequency of Social Gatherings with Friends and Family: More than three times a week    Attends Religious Services: More than 4 times per year    Active Member of [employer] or Organizations: No    Attends Banker Meetings: Never    Marital Status: Married  Catering Manager Violence: Not At Risk (04/25/2024)   Humiliation, Afraid, Rape, and Kick questionnaire    Fear of Current or Ex-Partner: No    Emotionally Abused: No    Physically Abused: No    Sexually Abused: No    Family History:    Family History  Problem Relation Age of Onset   Colon cancer Neg Hx    Esophageal cancer Neg Hx    Rectal cancer Neg Hx    Stomach cancer Neg Hx      ROS:  Please see the history of present illness.   All other ROS reviewed and negative.     Physical Exam/Data: Vitals:   04/25/24 1706 04/25/24 1836 04/25/24 1850 04/25/24 2005  BP:  (!) 162/64  121/75  Pulse:  83  84  Resp:  18  16  Temp: 98.3 F (36.8 C) 98.1 F (36.7 C)  98.2 F (36.8 C)  TempSrc: Oral Oral    SpO2:  93%  99%  Weight:   78.4 kg   Height:   5'  11 (1.803 m)    No intake or output data in the 24 hours ending 04/25/24 2017    04/25/2024    6:50 PM 03/02/2024    1:29 PM 08/17/2023   10:31 AM  Last 3 Weights  Weight (lbs) 172 lb 13.5 oz 172 lb 181 lb  Weight (kg) 78.4 kg 78.019 kg 82.101 kg     Body mass index is 24.11 kg/m.  General:  Well nourished, well developed, in no acute distress HEENT: normal Neck: no JVD Vascular: No carotid bruits; Distal pulses 2+ bilaterally Cardiac:  normal S1, S2; RRR; I/VI systolic murmru Lungs:  clear to auscultation bilaterally, no wheezing, rhonchi or rales  Abd: soft, nontender, no hepatomegaly  Ext: no edema Musculoskeletal:  No deformities, BUE and BLE strength normal and equal Skin: warm and dry  Neuro:  CNs 2-12 intact, no focal abnormalities noted Psych:  Normal affect   EKG:  The EKG was personally reviewed and demonstrates:  sinus rhythm Telemetry:  Telemetry was personally reviewed and demonstrates:  sinus rhythm  Relevant CV Studies: reviewed  Laboratory Data: High Sensitivity Troponin:  No results for input(s): TROPONINIHS in the last 720 hours.   Chemistry Recent Labs  Lab 04/25/24 1139  NA 137  K 4.6  CL 96*  CO2 28  GLUCOSE 135*  BUN 29*  CREATININE 1.78*  CALCIUM 7.0*  GFRNONAA 37*  ANIONGAP 13    No results for input(s): PROT, ALBUMIN , AST, ALT, ALKPHOS, BILITOT in the last 168 hours. Lipids No results for input(s): CHOL, TRIG, HDL, LABVLDL, LDLCALC, CHOLHDL in the last 168 hours.  Hematology Recent Labs  Lab 04/25/24 1139  WBC 9.5  RBC 4.22  HGB 12.7*  HCT 38.2*  MCV 90.5  MCH 30.1  MCHC 33.2  RDW 11.9  PLT 142*   Thyroid No results for input(s): TSH, FREET4 in the last 168 hours.  BNP Recent Labs  Lab 04/25/24 1139  PROBNP 267.0    DDimer No results  for input(s): DDIMER in the last 168 hours.  Radiology/Studies:  CT Angio Chest PE W and/or Wo Contrast Result Date: 04/25/2024 CLINICAL DATA:  Concern  for pulmonary embolism. EXAM: CT ANGIOGRAPHY CHEST WITH CONTRAST TECHNIQUE: Multidetector CT imaging of the chest was performed using the standard protocol during bolus administration of intravenous contrast. Multiplanar CT image reconstructions and MIPs were obtained to evaluate the vascular anatomy. RADIATION DOSE REDUCTION: This exam was performed according to the departmental dose-optimization program which includes automated exposure control, adjustment of the mA and/or kV according to patient size and/or use of iterative reconstruction technique. CONTRAST:  50mL OMNIPAQUE IOHEXOL 350 MG/ML SOLN COMPARISON:  Chest radiograph dated 04/25/2024 and CT dated 12/05/2021. FINDINGS: Cardiovascular: There is no cardiomegaly or pericardial effusion. Three-vessel coronary vascular calcification. Moderate calcified and noncalcified plaque of the thoracic aorta. No abnormal dilatation or dissection. No pulmonary artery embolus identified. Mediastinum/Nodes: No hilar or mediastinal adenopathy. The esophagus and thyroid gland are grossly unremarkable. No mediastinal fluid collection. Lungs/Pleura: No focal consolidation, pleural effusion, pneumothorax. The central airways are patent. Upper Abdomen: Cholecystectomy. Musculoskeletal: No acute osseous pathology. T11 compression fracture as seen on the prior CT. Review of the MIP images confirms the above findings. IMPRESSION: 1. No acute intrathoracic pathology. No CT evidence of pulmonary artery embolus. 2. Three-vessel coronary vascular calcification. 3.  Aortic Atherosclerosis (ICD10-I70.0). Electronically Signed   By: Vanetta Chou M.D.   On: 04/25/2024 14:29   CT Head Wo Contrast Result Date: 04/25/2024 EXAM: CT HEAD WITHOUT CONTRAST 04/25/2024 12:03:46 PM TECHNIQUE: CT of the head was performed without the administration of intravenous contrast. Automated exposure control, iterative reconstruction, and/or weight based adjustment of the mA/kV was utilized to reduce  the radiation dose to as low as reasonably achievable. COMPARISON: None available. CLINICAL HISTORY: Mental status change, unknown cause. Triage note: Reports confusion and disorient starting at 2145 last night. Family states patient has been taking tramadol  for a pinched nerve in back. Family concerned he might have taken too much Family states patient normally is A\T\Ox4. Unable to answer questions or follow commands in triage. FINDINGS: BRAIN AND VENTRICLES: No acute hemorrhage. No evidence of acute infarct. No hydrocephalus. No extra-axial collection. No mass effect or midline shift. Age-related atrophy. Mild periventricular and deep white matter hypodensity typical of chronic small vessel ischemia. Atherosclerosis of skullbase vasculature without hyperdense vessel or abnormal calcification. ORBITS: Bilateral cataract resection. SINUSES: Mucosal thickening throughout ethmoid air cells. SOFT TISSUES AND SKULL: No acute soft tissue abnormality. No skull fracture. IMPRESSION: 1. No acute intracranial abnormality. 2. Age-related atrophy and mild periventricular and deep white matter hypodensity typical of chronic small vessel ischemia. Electronically signed by: Evalene Coho MD 04/25/2024 12:30 PM EDT RP Workstation: [id number]   DG Chest Portable 1 View Result Date: 04/25/2024 CLINICAL DATA:  Possible aspiration.  Hypoxia. EXAM: PORTABLE CHEST 1 VIEW COMPARISON:  09/08/2022 FINDINGS: Lungs are hypoinflated without acute airspace consolidation or effusion. Cardiomediastinal silhouette and remainder of the exam is unchanged. IMPRESSION: Hypoinflation without acute cardiopulmonary disease. Electronically Signed   By: Toribio Agreste M.D.   On: 04/25/2024 12:15     Assessment and Plan:  DAIRE OKIMOTO is a 88 y.o. male with a hx of hypertension, peripheral vascular disease s/p stent to the left iliac artery, and CHF  who is being seen 04/25/2024 for the evaluation of elevated troponin at the request of the  medicine team.  Patient's symptoms are suggestive of stable angina. He currently has no clinical or ECG evidence of ischemia.  Given his advanced age, hypertension, and history of peripheral vascular disease, pretest probability of obstructive coronary disease is likely high.  He has acute myocardial injury likely in the setting of demand ischemia from orthostatic hypotension.  I had a good discussion with the patient regarding next steps of care.  Perry Rosales is very functional, and his symptoms are not debilitating.  He is also adherent with  aspirin . Does not wish to pursue any invasive treatment such as left heart catheterization.  I agree with this plan.  Counseled patient on addressing his symptoms with cardiology if they recur or worsen in frequency.  Management of dizziness per primary team No further workup at this time Could consider stress testing in the future   Risk Assessment/Risk Scores:    TIMI Risk Score for Unstable Angina or Non-ST Elevation MI:   The patient's TIMI risk score is  , which indicates a  % risk of all cause mortality, new or recurrent myocardial infarction or need for urgent revascularization in the next 14 days.         For questions or updates, please contact  [facility] Please consult [email or web address] for contact info under      Signed, Eliya Bubar A Kelsay Haggard, MD  04/25/2024 8:17 PM

## 2024-04-25 NOTE — ED Notes (Addendum)
 Pt currently knows his name, DOB, the current year, the president and who his son is.

## 2024-04-25 NOTE — ED Notes (Signed)
 Pt responded well to the narcan. Hoarse voice improved and has not been making confusing comments per the son.

## 2024-04-25 NOTE — ED Notes (Signed)
 Thomas with cl called for transport

## 2024-04-26 ENCOUNTER — Inpatient Hospital Stay (HOSPITAL_COMMUNITY)

## 2024-04-26 ENCOUNTER — Other Ambulatory Visit: Payer: Self-pay

## 2024-04-26 DIAGNOSIS — N189 Chronic kidney disease, unspecified: Secondary | ICD-10-CM | POA: Diagnosis not present

## 2024-04-26 DIAGNOSIS — R7989 Other specified abnormal findings of blood chemistry: Secondary | ICD-10-CM

## 2024-04-26 DIAGNOSIS — G929 Unspecified toxic encephalopathy: Secondary | ICD-10-CM | POA: Diagnosis not present

## 2024-04-26 DIAGNOSIS — R079 Chest pain, unspecified: Secondary | ICD-10-CM

## 2024-04-26 DIAGNOSIS — N179 Acute kidney failure, unspecified: Secondary | ICD-10-CM | POA: Diagnosis not present

## 2024-04-26 DIAGNOSIS — G9341 Metabolic encephalopathy: Secondary | ICD-10-CM | POA: Diagnosis not present

## 2024-04-26 LAB — ECHOCARDIOGRAM COMPLETE
AR max vel: 2.02 cm2
AV Area VTI: 2.05 cm2
AV Area mean vel: 2.05 cm2
AV Mean grad: 8.6 mmHg
AV Peak grad: 16 mmHg
Ao pk vel: 2 m/s
Area-P 1/2: 2.84 cm2
Calc EF: 57.6 %
Height: 71 in
S' Lateral: 2.8 cm
Single Plane A2C EF: 57.4 %
Single Plane A4C EF: 57.9 %
Weight: 2765.45 [oz_av]

## 2024-04-26 LAB — CBC
HCT: 34.2 % — ABNORMAL LOW (ref 39.0–52.0)
Hemoglobin: 11.6 g/dL — ABNORMAL LOW (ref 13.0–17.0)
MCH: 30.1 pg (ref 26.0–34.0)
MCHC: 33.9 g/dL (ref 30.0–36.0)
MCV: 88.6 fL (ref 80.0–100.0)
Platelets: 139 K/uL — ABNORMAL LOW (ref 150–400)
RBC: 3.86 MIL/uL — ABNORMAL LOW (ref 4.22–5.81)
RDW: 11.8 % (ref 11.5–15.5)
WBC: 6.5 K/uL (ref 4.0–10.5)
nRBC: 0 % (ref 0.0–0.2)

## 2024-04-26 LAB — BASIC METABOLIC PANEL WITH GFR
Anion gap: 14 (ref 5–15)
BUN: 24 mg/dL — ABNORMAL HIGH (ref 8–23)
CO2: 26 mmol/L (ref 22–32)
Calcium: 6.6 mg/dL — ABNORMAL LOW (ref 8.9–10.3)
Chloride: 95 mmol/L — ABNORMAL LOW (ref 98–111)
Creatinine, Ser: 1.47 mg/dL — ABNORMAL HIGH (ref 0.61–1.24)
GFR, Estimated: 46 mL/min — ABNORMAL LOW (ref >60)
Glucose, Bld: 200 mg/dL — ABNORMAL HIGH (ref 70–99)
Potassium: 4.3 mmol/L (ref 3.5–5.1)
Sodium: 135 mmol/L (ref 135–145)

## 2024-04-26 LAB — COMPREHENSIVE METABOLIC PANEL WITH GFR
ALT: 10 U/L (ref 0–44)
AST: 15 U/L (ref 15–41)
Albumin: 2.8 g/dL — ABNORMAL LOW (ref 3.5–5.0)
Alkaline Phosphatase: 43 U/L (ref 38–126)
Anion gap: 10 (ref 5–15)
BUN: 26 mg/dL — ABNORMAL HIGH (ref 8–23)
CO2: 26 mmol/L (ref 22–32)
Calcium: 6.4 mg/dL — CL (ref 8.9–10.3)
Chloride: 101 mmol/L (ref 98–111)
Creatinine, Ser: 1.52 mg/dL — ABNORMAL HIGH (ref 0.61–1.24)
GFR, Estimated: 44 mL/min — ABNORMAL LOW (ref >60)
Glucose, Bld: 108 mg/dL — ABNORMAL HIGH (ref 70–99)
Potassium: 3.7 mmol/L (ref 3.5–5.1)
Sodium: 137 mmol/L (ref 135–145)
Total Bilirubin: 0.7 mg/dL (ref 0.0–1.2)
Total Protein: 5.5 g/dL — ABNORMAL LOW (ref 6.5–8.1)

## 2024-04-26 MED ORDER — CALCIUM GLUCONATE-NACL 1-0.675 GM/50ML-% IV SOLN
1.0000 g | Freq: Once | INTRAVENOUS | Status: AC
  Administered 2024-04-26: 1000 mg via INTRAVENOUS
  Filled 2024-04-26: qty 50

## 2024-04-26 MED ORDER — ACETAMINOPHEN 500 MG PO TABS
1000.0000 mg | ORAL_TABLET | Freq: Four times a day (QID) | ORAL | Status: DC
  Administered 2024-04-26: 1000 mg via ORAL
  Filled 2024-04-26: qty 2

## 2024-04-26 MED ORDER — OYSTER SHELL CALCIUM/D3 500-5 MG-MCG PO TABS: 2.0000 | ORAL_TABLET | Freq: Two times a day (BID) | ORAL | 0 refills | Status: DC

## 2024-04-26 MED ORDER — LIDOCAINE 5 % EX PTCH
1.0000 | MEDICATED_PATCH | CUTANEOUS | Status: DC
  Administered 2024-04-26: 1 via TRANSDERMAL
  Filled 2024-04-26: qty 1

## 2024-04-26 MED ORDER — FUROSEMIDE 20 MG PO TABS: 20.0000 mg | ORAL_TABLET | Freq: Every day | ORAL | Status: DC | PRN

## 2024-04-26 MED ORDER — TRAMADOL HCL 50 MG PO TABS
50.0000 mg | ORAL_TABLET | Freq: Two times a day (BID) | ORAL | Status: DC | PRN
  Administered 2024-04-26: 50 mg via ORAL
  Filled 2024-04-26: qty 1

## 2024-04-26 MED ORDER — TRAMADOL HCL 50 MG PO TABS: 50.0000 mg | ORAL_TABLET | Freq: Two times a day (BID) | ORAL | Status: AC | PRN

## 2024-04-26 NOTE — Discharge Summary (Signed)
 Physician Discharge Summary   Patient: Perry Rosales MRN: [id number] DOB: [birth year]  Admit date:     04/25/2024  Discharge date: 04/26/24  Discharge Physician: Yetta Blanch  PCP: Yolande Toribio MATSU, MD  Recommendations at discharge: Follow-up with PCP in 1 week with BMP. Follow-up with cardiology as recommended.   Follow-up Information     Yolande Toribio MATSU, MD. Schedule an appointment as soon as possible for a visit in 1 week(s).   Specialty: Internal Medicine Why: with BMP lab to look at kidney/electrolyte numbers Contact information: [address] [place] [address] [phone]                Hospital Course: Perry Rosales is a 88 y.o. 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  Assessment and plan. Acute toxic encephalopathy and hypoxia secondary to adverse effect of tramadol : UDS positive for marijuana and methadone (reported recent THC gummy use) History and presentation consistent with acute encephalopathy secondary to tramadol  use with decreased renal clearance in the setting of AKI on CKD.  Per ED documentation he was hypoxic to 84% on room air in the ED, improved on 3 L O2 via [place].  Also likely from respiratory depression due to tramadol .  Mentation improved after receiving IV Narcan prior to arrival.  He is back to baseline, fully alert and oriented Discussed with patient that tramadol  should be renal dosed, limit to every 12 hours and avoid excess use   Acute kidney injury superimposed on CKD stage IIIa: Creatinine 1.78 on admission compared to previous baseline ~1.3-1.4.  Changing Lasix  to as needed. Treated with IV fluid.   Chronic HFpEF/Elevated troponin: Patient is felt to have had stress-induced  cardiomyopathy at time of his right hip fracture March 2024.  EF normalized to 55% on TTE 10/2022.  Mild troponin elevation 103 > 115 noted.  He has not had any chest pain or significant EKG changes.  He does not appear to have any evidence of acute CHF or volume overload. Cardiology consulted. Echocardiogram shows preserved EF. Patient will follow-up with cardiology outpatient.   Hypocalcemia: Replaced but Continue oral supplementation. Patient asymptomatic.   PAD s/p left external iliac artery stenting 10/2022: Continue aspirin  and rosuvastatin.  Pain control - [place]  Controlled Substance Reporting System database was reviewed. and patient was instructed, not to drive, operate heavy machinery, perform activities at heights, swimming or participation in water activities or provide baby-sitting services while on Pain, Sleep and Anxiety Medications; until their outpatient Physician has advised to do so again. Also recommended to not to take more than prescribed Pain, Sleep and Anxiety Medications.  Consultants:  Cardiology  Procedures performed:  Echocardiogram  DISCHARGE MEDICATION: Allergies as of 04/26/2024   No Known Allergies      Medication List     TAKE these medications    acetaminophen  500 MG tablet Commonly known as: TYLENOL  Take 500-1,000 mg by mouth every 6 (six) hours as needed for moderate pain (pain score 4-6).   amitriptyline  50 MG tablet Commonly known as: ELAVIL  Take 1 tablet (50 mg total) by mouth at bedtime.   aspirin  EC 81 MG tablet Take 81 mg by mouth daily. Swallow whole.   calcium carbonate 500 MG chewable tablet Commonly known as: TUMS - dosed in mg elemental calcium Chew  2-3 tablets by mouth daily as needed for indigestion or heartburn.   calcium-vitamin D 500-5 MG-MCG tablet Commonly known as: OSCAL WITH D Take 2 tablets by mouth 2 (two) times daily.   furosemide  20 MG tablet Commonly known as: LASIX  Take 1 tablet (20 mg total) by  mouth daily as needed (for weight gain of 3 lbs in 1 day or 5 lbs in 1 week). What changed:  when to take this reasons to take this   omeprazole  20 MG capsule Commonly known as: PRILOSEC Take 1 capsule (20 mg total) by mouth daily.   polyethylene glycol 17 g packet Commonly known as: MIRALAX  / GLYCOLAX  Take 17 g by mouth daily.   potassium chloride  10 MEQ tablet Commonly known as: KLOR-CON  TAKE 1 TABLET EVERY DAY   rosuvastatin 10 MG tablet Commonly known as: CRESTOR Take 10 mg by mouth daily.   tamsulosin  0.4 MG Caps capsule Commonly known as: FLOMAX  Take 1 capsule (0.4 mg total) by mouth in the morning and at bedtime.   traMADol  50 MG tablet Commonly known as: ULTRAM  Take 1 tablet (50 mg total) by mouth every 12 (twelve) hours as needed. What changed:  when to take this reasons to take this additional instructions       Disposition: Home Diet recommendation: Cardiac diet  Discharge Exam: Vitals:   04/25/24 2005 04/26/24 0529 04/26/24 0828 04/26/24 1600  BP: 121/75 (!) 147/65 (!) 136/59 (!) 140/61  Pulse: 84 64 73   Resp: 16 18 18 14   Temp: 98.2 F (36.8 C) (!) 97.5 F (36.4 C) 97.6 F (36.4 C) 97.7 F (36.5 C)  TempSrc:  Oral Oral Oral  SpO2: 99% 100% 99% 100%  Weight:      Height:       Clear to auscultation. S1-S2 present Bowel sound present No edema. Filed Weights   04/25/24 1850  Weight: 78.4 kg   Condition at discharge: stable  The results of significant diagnostics from this hospitalization (including imaging, microbiology, ancillary and laboratory) are listed below for reference.   Imaging Studies: ECHOCARDIOGRAM COMPLETE Result Date: 04/26/2024    ECHOCARDIOGRAM REPORT   Patient Name:   Perry Rosales Date of Exam: 04/26/2024 Medical Rec #:  [id number]      Height:       71.0 in Accession #:    [id number]     Weight:       172.8 lb Date of Birth:  [birth year]      BSA:          1.982 m Patient Age:    88 years       BP:           136/59  mmHg Patient Gender: M              HR:           69 bpm. Exam Location:  Inpatient Procedure: 2D Echo, Cardiac Doppler and Color Doppler (Both Spectral and Color            Flow Doppler were utilized during procedure). Indications:    R07.9* Chest pain, unspecified. Elevated troponin  History:        Patient has prior history of Echocardiogram examinations, most                 recent 10/30/2022. CHF, Signs/Symptoms:Edema; Risk                 Factors:Hypertension and Former Smoker.  Sonographer:  Ellouise Mose RDCS Referring Phys: [id number] ZANE ADAMS IMPRESSIONS  1. Left ventricular ejection fraction, by estimation, is 55 to 60%. Left ventricular ejection fraction by 2D MOD biplane is 57.6 %. The left ventricle has normal function. The left ventricle demonstrates regional wall motion abnormalities (see scoring diagram/findings for description). Left ventricular diastolic parameters are consistent with Grade I diastolic dysfunction (impaired relaxation).  2. Right ventricular systolic function is normal. The right ventricular size is normal. Tricuspid regurgitation signal is inadequate for assessing PA pressure.  3. The mitral valve is grossly normal. Trivial mitral valve regurgitation.  4. The aortic valve is tricuspid. Aortic valve regurgitation is not visualized. Aortic valve sclerosis/calcification is present, without any evidence of aortic stenosis. Aortic valve mean gradient measures 8.6 mmHg.  5. The inferior vena cava is normal in size with greater than 50% respiratory variability, suggesting right atrial pressure of 3 mmHg. Comparison(s): Changes from prior study are noted. 10/30/2022: LVEF 55%, severe basal inferior hypokinesis. FINDINGS  Left Ventricle: Left ventricular ejection fraction, by estimation, is 55 to 60%. Left ventricular ejection fraction by 2D MOD biplane is 57.6 %. The left ventricle has normal function. The left ventricle demonstrates regional wall motion abnormalities. Severe akinesis of the  left ventricular, basal inferior wall. The left ventricular internal cavity size was normal in size. There is no left ventricular hypertrophy. Left ventricular diastolic parameters are consistent with Grade I diastolic dysfunction (impaired relaxation). Indeterminate filling pressures.  LV Wall Scoring: The basal inferolateral segment is akinetic. Right Ventricle: The right ventricular size is normal. No increase in right ventricular wall thickness. Right ventricular systolic function is normal. Tricuspid regurgitation signal is inadequate for assessing PA pressure. Left Atrium: Left atrial size was normal in size. Right Atrium: Right atrial size was normal in size. Pericardium: There is no evidence of pericardial effusion. Mitral Valve: The mitral valve is grossly normal. Trivial mitral valve regurgitation. Tricuspid Valve: The tricuspid valve is grossly normal. Tricuspid valve regurgitation is trivial. Aortic Valve: The aortic valve is tricuspid. Aortic valve regurgitation is not visualized. Aortic valve sclerosis/calcification is present, without any evidence of aortic stenosis. Aortic valve mean gradient measures 8.6 mmHg. Aortic valve peak gradient measures 16.0 mmHg. Aortic valve area, by VTI measures 2.05 cm. Pulmonic Valve: The pulmonic valve was normal in structure. Pulmonic valve regurgitation is not visualized. Aorta: The aortic root and ascending aorta are structurally normal, with no evidence of dilitation. Venous: The inferior vena cava is normal in size with greater than 50% respiratory variability, suggesting right atrial pressure of 3 mmHg. IAS/Shunts: No atrial level shunt detected by color flow Doppler.  LEFT VENTRICLE PLAX 2D                        Biplane EF (MOD) LVIDd:         4.50 cm         LV Biplane EF:   Left LVIDs:         2.80 cm                          ventricular LV PW:         1.00 cm                          ejection LV IVS:        1.10 cm  fraction by LVOT  diam:     2.30 cm                          2D MOD LV SV:         88                               biplane is LV SV Index:   45                               57.6 %. LVOT Area:     4.15 cm                                Diastology                                LV e' medial:    6.09 cm/s LV Volumes (MOD)               LV E/e' medial:  7.6 LV vol d, MOD    89.9 ml       LV e' lateral:   8.27 cm/s A2C:                           LV E/e' lateral: 5.6 LV vol d, MOD    76.2 ml A4C: LV vol s, MOD    38.3 ml A2C: LV vol s, MOD    32.1 ml A4C: LV SV MOD A2C:   51.6 ml LV SV MOD A4C:   76.2 ml LV SV MOD BP:    48.2 ml RIGHT VENTRICLE             IVC RV S prime:     14.50 cm/s  IVC diam: 1.90 cm TAPSE (M-mode): 1.6 cm                             PULMONARY VEINS                             Diastolic Velocity: 45.60 cm/s                             S/D Velocity:       1.00                             Systolic Velocity:  44.70 cm/s LEFT ATRIUM             Index        RIGHT ATRIUM           Index LA diam:        2.70 cm 1.36 cm/m   RA Area:     10.40 cm LA Vol (A2C):   23.3 ml 11.76 ml/m  RA Volume:   18.80 ml  9.49 ml/m LA Vol (A4C):   26.0 ml 13.12 ml/m LA Biplane Vol: 24.8 ml 12.51 ml/m  AORTIC VALVE AV Area (Vmax):    2.02 cm AV Area (Vmean):  2.05 cm AV Area (VTI):     2.05 cm AV Vmax:           200.20 cm/s AV Vmean:          134.200 cm/s AV VTI:            0.431 m AV Peak Grad:      16.0 mmHg AV Mean Grad:      8.6 mmHg LVOT Vmax:         97.20 cm/s LVOT Vmean:        66.100 cm/s LVOT VTI:          0.213 m LVOT/AV VTI ratio: 0.49  AORTA Ao Root diam: 3.30 cm Ao Asc diam:  3.30 cm MITRAL VALVE MV Area (PHT): 2.84 cm    SHUNTS MV Decel Time: 268 msec    Systemic VTI:  0.21 m MV E velocity: 46.03 cm/s  Systemic Diam: 2.30 cm MV A velocity: 75.20 cm/s MV E/A ratio:  0.61 Vinie Maxcy MD Electronically signed by Vinie Maxcy MD Signature Date/Time: 04/26/2024/4:46:55 PM    Final    CT Angio Chest PE W and/or Wo  Contrast Result Date: 04/25/2024 CLINICAL DATA:  Concern for pulmonary embolism. EXAM: CT ANGIOGRAPHY CHEST WITH CONTRAST TECHNIQUE: Multidetector CT imaging of the chest was performed using the standard protocol during bolus administration of intravenous contrast. Multiplanar CT image reconstructions and MIPs were obtained to evaluate the vascular anatomy. RADIATION DOSE REDUCTION: This exam was performed according to the departmental dose-optimization program which includes automated exposure control, adjustment of the mA and/or kV according to patient size and/or use of iterative reconstruction technique. CONTRAST:  50mL OMNIPAQUE IOHEXOL 350 MG/ML SOLN COMPARISON:  Chest radiograph dated 04/25/2024 and CT dated 12/05/2021. FINDINGS: Cardiovascular: There is no cardiomegaly or pericardial effusion. Three-vessel coronary vascular calcification. Moderate calcified and noncalcified plaque of the thoracic aorta. No abnormal dilatation or dissection. No pulmonary artery embolus identified. Mediastinum/Nodes: No hilar or mediastinal adenopathy. The esophagus and thyroid gland are grossly unremarkable. No mediastinal fluid collection. Lungs/Pleura: No focal consolidation, pleural effusion, pneumothorax. The central airways are patent. Upper Abdomen: Cholecystectomy. Musculoskeletal: No acute osseous pathology. T11 compression fracture as seen on the prior CT. Review of the MIP images confirms the above findings. IMPRESSION: 1. No acute intrathoracic pathology. No CT evidence of pulmonary artery embolus. 2. Three-vessel coronary vascular calcification. 3.  Aortic Atherosclerosis (ICD10-I70.0). Electronically Signed   By: Vanetta Chou M.D.   On: 04/25/2024 14:29   CT Head Wo Contrast Result Date: 04/25/2024 EXAM: CT HEAD WITHOUT CONTRAST 04/25/2024 12:03:46 PM TECHNIQUE: CT of the head was performed without the administration of intravenous contrast. Automated exposure control, iterative reconstruction, and/or  weight based adjustment of the mA/kV was utilized to reduce the radiation dose to as low as reasonably achievable. COMPARISON: None available. CLINICAL HISTORY: Mental status change, unknown cause. Triage note: Reports confusion and disorient starting at 2145 last night. Family states patient has been taking tramadol  for a pinched nerve in back. Family concerned he might have taken too much Family states patient normally is A\T\Ox4. Unable to answer questions or follow commands in triage. FINDINGS: BRAIN AND VENTRICLES: No acute hemorrhage. No evidence of acute infarct. No hydrocephalus. No extra-axial collection. No mass effect or midline shift. Age-related atrophy. Mild periventricular and deep white matter hypodensity typical of chronic small vessel ischemia. Atherosclerosis of skullbase vasculature without hyperdense vessel or abnormal calcification. ORBITS: Bilateral cataract resection. SINUSES: Mucosal thickening throughout ethmoid air cells. SOFT TISSUES AND SKULL:  No acute soft tissue abnormality. No skull fracture. IMPRESSION: 1. No acute intracranial abnormality. 2. Age-related atrophy and mild periventricular and deep white matter hypodensity typical of chronic small vessel ischemia. Electronically signed by: Evalene Coho MD 04/25/2024 12:30 PM EDT RP Workstation: [id number]   DG Chest Portable 1 View Result Date: 04/25/2024 CLINICAL DATA:  Possible aspiration.  Hypoxia. EXAM: PORTABLE CHEST 1 VIEW COMPARISON:  09/08/2022 FINDINGS: Lungs are hypoinflated without acute airspace consolidation or effusion. Cardiomediastinal silhouette and remainder of the exam is unchanged. IMPRESSION: Hypoinflation without acute cardiopulmonary disease. Electronically Signed   By: Toribio Agreste M.D.   On: 04/25/2024 12:15    Microbiology: Results for orders placed or performed during the hospital encounter of 04/25/24  Resp panel by RT-PCR (RSV, Flu A&B, Covid) Anterior Nasal Swab     Status: None    Collection Time: 04/25/24 11:43 AM   Specimen: Anterior Nasal Swab  Result Value Ref Range Status   SARS Coronavirus 2 by RT PCR NEGATIVE NEGATIVE Final    Comment: (NOTE) SARS-CoV-2 target nucleic acids are NOT DETECTED.  The SARS-CoV-2 RNA is generally detectable in upper respiratory specimens during the acute phase of infection. The lowest concentration of SARS-CoV-2 viral copies this assay can detect is 138 copies/mL. A negative result does not preclude SARS-Cov-2 infection and should not be used as the sole basis for treatment or other patient management decisions. A negative result may occur with  improper specimen collection/handling, submission of specimen other than nasopharyngeal swab, presence of viral mutation(s) within the areas targeted by this assay, and inadequate number of viral copies(<138 copies/mL). A negative result must be combined with clinical observations, patient history, and epidemiological information. The expected result is Negative.  Fact Sheet for Patients:  bloggercourse.com  Fact Sheet for Healthcare Providers:  seriousbroker.it  This test is no t yet approved or cleared by the [place]  FDA and  has been authorized for detection and/or diagnosis of SARS-CoV-2 by FDA under an Emergency Use Authorization (EUA). This EUA will remain  in effect (meaning this test can be used) for the duration of the COVID-19 declaration under Section 564(b)(1) of the Act, 21 U.S.C.section 360bbb-3(b)(1), unless the authorization is terminated  or revoked sooner.       Influenza A by PCR NEGATIVE NEGATIVE Final   Influenza B by PCR NEGATIVE NEGATIVE Final    Comment: (NOTE) The Xpert Xpress SARS-CoV-2/FLU/RSV plus assay is intended as an aid in the diagnosis of influenza from Nasopharyngeal swab specimens and should not be used as a sole basis for treatment. Nasal washings and aspirates are unacceptable for  Xpert Xpress SARS-CoV-2/FLU/RSV testing.  Fact Sheet for Patients: bloggercourse.com  Fact Sheet for Healthcare Providers: seriousbroker.it  This test is not yet approved or cleared by the [place]  FDA and has been authorized for detection and/or diagnosis of SARS-CoV-2 by FDA under an Emergency Use Authorization (EUA). This EUA will remain in effect (meaning this test can be used) for the duration of the COVID-19 declaration under Section 564(b)(1) of the Act, 21 U.S.C. section 360bbb-3(b)(1), unless the authorization is terminated or revoked.     Resp Syncytial Virus by PCR NEGATIVE NEGATIVE Final    Comment: (NOTE) Fact Sheet for Patients: bloggercourse.com  Fact Sheet for Healthcare Providers: seriousbroker.it  This test is not yet approved or cleared by the [place]  FDA and has been authorized for detection and/or diagnosis of SARS-CoV-2 by FDA under an Emergency Use Authorization (EUA). This EUA will remain in effect (meaning  this test can be used) for the duration of the COVID-19 declaration under Section 564(b)(1) of the Act, 21 U.S.C. section 360bbb-3(b)(1), unless the authorization is terminated or revoked.  Performed at [facility], [address] [place], [place] [address]    Labs: CBC: Recent Labs  Lab 04/25/24 1139 04/26/24 0448  WBC 9.5 6.5  NEUTROABS 8.0*  --   HGB 12.7* 11.6*  HCT 38.2* 34.2*  MCV 90.5 88.6  PLT 142* 139*   Basic Metabolic Panel: Recent Labs  Lab 04/25/24 1139 04/26/24 0448 04/26/24 1335  NA 137 137 135  K 4.6 3.7 4.3  CL 96* 101 95*  CO2 28 26 26   GLUCOSE 135* 108* 200*  BUN 29* 26* 24*  CREATININE 1.78* 1.52* 1.47*  CALCIUM 7.0* 6.4* 6.6*   Liver Function Tests: Recent Labs  Lab 04/25/24 2013 04/26/24 0448  AST  --  15  ALT  --  10  ALKPHOS  --  43  BILITOT  --  0.7  PROT  --   5.5*  ALBUMIN  3.2* 2.8*   CBG: No results for input(s): GLUCAP in the last 168 hours.  Discharge time spent: greater than 30 minutes.  Author: Yetta Blanch, MD  [facility]

## 2024-04-26 NOTE — Progress Notes (Signed)
  Echocardiogram 2D Echocardiogram has been performed.  Perry Rosales 04/26/2024, 3:21 PM

## 2024-04-26 NOTE — Progress Notes (Addendum)
 Cardiology Progress Note  Patient ID: Perry Rosales MRN: [id number] DOB: 1935/12/23 Date of Encounter: 04/26/2024 Primary Cardiologist: Darryle ONEIDA Decent, MD  Subjective   Chief Complaint: None.   HPI: Admitted with tramadol  overuse and encephalopathy.   ROS:  All other ROS reviewed and negative. Pertinent positives noted in the HPI.     Telemetry  Overnight telemetry shows sinus rhythm 70s, which I personally reviewed.   ECG  The most recent ECG shows NSR 91, no acute changes, which I personally reviewed.   Physical Exam   Vitals:   04/25/24 1850 04/25/24 2005 04/26/24 0529 04/26/24 0828  BP:  121/75 (!) 147/65 (!) 136/59  Pulse:  84 64 73  Resp:  16 18 18   Temp:  98.2 F (36.8 C) (!) 97.5 F (36.4 C) 97.6 F (36.4 C)  TempSrc:   Oral Oral  SpO2:  99% 100% 99%  Weight: 78.4 kg     Height: 5' 11 (1.803 m)       Intake/Output Summary (Last 24 hours) at 04/26/2024 0956 Last data filed at 04/26/2024 9386 Gross per 24 hour  Intake 891.26 ml  Output 450 ml  Net 441.26 ml       04/25/2024    6:50 PM 03/02/2024    1:29 PM 08/17/2023   10:31 AM  Last 3 Weights  Weight (lbs) 172 lb 13.5 oz 172 lb 181 lb  Weight (kg) 78.4 kg 78.019 kg 82.101 kg    Body mass index is 24.11 kg/m.  General: Well nourished, well developed, in no acute distress Head: Atraumatic, normal size  Eyes: PEERLA, EOMI  Neck: Supple, no JVD Endocrine: No thryomegaly Cardiac: Normal S1, S2; RRR; no murmurs, rubs, or gallops Lungs: Clear to auscultation bilaterally, no wheezing, rhonchi or rales  Abd: Soft, nontender, no hepatomegaly  Ext: No edema, pulses 2+ Musculoskeletal: No deformities, BUE and BLE strength normal and equal Skin: Warm and dry, no rashes   Neuro: Alert and oriented to person, place, time, and situation, CNII-XII grossly intact, no focal deficits  Psych: Normal mood and affect   Cardiac Studies  TTE 10/30/2022  1. Left ventricular ejection fraction, by estimation, is 55%.  The left  ventricle has normal function. The left ventricle demonstrates regional  wall motion abnormalities with basal inferior severe hypokinesis. Left  ventricular diastolic parameters are  consistent with Grade I diastolic dysfunction (impaired relaxation).   2. Right ventricular systolic function is normal. The right ventricular  size is normal. Tricuspid regurgitation signal is inadequate for assessing  PA pressure.   3. The mitral valve is normal in structure. No evidence of mitral valve  regurgitation. No evidence of mitral stenosis.   4. The aortic valve is tricuspid. There is moderate calcification of the  aortic valve. Aortic valve regurgitation is not visualized. Aortic valve  sclerosis/calcification is present, without any evidence of aortic  stenosis. Aortic valve mean gradient  measures 7.0 mmHg.   5. The inferior vena cava is normal in size with greater than 50%  respiratory variability, suggesting right atrial pressure of 3 mmHg.   Patient Profile  Perry Rosales is a 88 y.o. male with systolic heart failure with recovery of ejection fraction, hypertension, PAD status post left external iliac stent, hyperlipidemia, CKD 3A admitted on 04/25/2024 with dizziness and fall.  Assessment & Plan   # Encephalopathy 2/2 Tramadol  use  - Picture consistent with tramadol  overuse. Had some slurred speech as well and was brought to the ER. - Overall  seems to be quite improved. He has been counseled on tramadol  use.   # AKI -improving.   # Elevated troponin, demand # Coronary calcifications - Troponins were checked which were minimally elevated and flat.  Had a slight AKI on admission which is improving with fluids. - Known coronary calcifications. - EKG is nonischemic. - No chest pain symptoms. - Overall, this is likely demand in setting of tramadol  overuse.  We will check an echo to make sure there is been no change in LV function.  As long as this is stable he can be  discharged. - Continue aspirin  81 mg daily.  Continue Crestor 10 mg daily.  # PAD - No issues  [facility] will sign off.   The patient is ready for discharge today from a cardiac standpoint. Medication Recommendations:  As above Other recommendations (labs, testing, etc):  pending echo Follow up as an outpatient:  6-8 weeks with me  For questions or updates, please contact [facility] Please consult [email or web address] for contact info under      Signed, Darryle T. Barbaraann, MD, [facility]  [facility]  04/26/2024 9:56 AM

## 2024-04-26 NOTE — Plan of Care (Signed)

## 2024-04-26 NOTE — Care Management Obs Status (Signed)
 MEDICARE OBSERVATION STATUS NOTIFICATION   Patient Details  Name: Perry Rosales MRN: [id number] Date of Birth: [birth year]   Medicare Observation Status Notification Given:  Yes    Tom-Johnson, Harvest Muskrat, RN 04/26/2024, 5:07 PM

## 2024-04-26 NOTE — TOC Transition Note (Signed)
 Transition of Care [facility]) - Discharge Note   Patient Details  Name: Perry Rosales MRN: [id number] Date of Birth: [birth year]  Transition of Care [facility]) CM/SW Contact:  Tom-Johnson, Harvest Muskrat, RN Phone Number: 04/26/2024, 5:16 PM   Clinical Narrative:     Patient is scheduled for discharge today.  Readmission Risk Assessment done. Outpatient f/u, hospital f/u and discharge instructions on AVS. No ICM needs or recommendations noted. Son, [name] to transport at discharge.  No further ICM needs noted.       Final next level of care: Home/Self Care Barriers to Discharge: Barriers Resolved   Patient Goals and CMS Choice Patient states their goals for this hospitalization and ongoing recovery are:: To return home CMS Medicare.gov Compare Post Acute Care list provided to:: Patient Choice offered to / list presented to : NA      Discharge Placement                Patient to be transferred to facility by: Son Name of family member notified: [facility]    Discharge Plan and Services Additional resources added to the After Visit Summary for                  DME Arranged: N/A DME Agency: NA         HH Agency: NA        Social Drivers of Health (SDOH) Interventions SDOH Screenings   Food Insecurity: No Food Insecurity (04/25/2024)  Housing: Low Risk  (04/25/2024)  Transportation Needs: No Transportation Needs (04/25/2024)  Utilities: Not At Risk (04/25/2024)  Social Connections: Moderately Integrated (04/25/2024)  Tobacco Use: Medium Risk (04/25/2024)     Readmission Risk Interventions    04/26/2024   11:17 AM  Readmission Risk Prevention Plan  Transportation Screening Complete  PCP or Specialist Appt within 5-7 Days Complete  Home Care Screening Complete  Medication Review (RN CM) Referral to Pharmacy

## 2024-04-26 NOTE — TOC CM/SW Note (Signed)
 Transition of Care [facility]) - Inpatient Brief Assessment   Patient Details  Name: Perry Rosales MRN: [id number] Date of Birth: 12/14/1935  Transition of Care [facility]) CM/SW Contact:    Tom-Johnson, Harvest Muskrat, RN Phone Number: 04/26/2024, 11:17 AM   Clinical Narrative:  Patient presented to the ED with Altered Mental Status, admitted with Acute Metabolic Encephalopathy. 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 Patient has a Foley catheter, Urology to follow.   Patient has hx of chronic HFpEF, PAD s/p Lt External Iliac Artery Stenting, CKD stage IIIa, HLD.  CM spoke with patient at bedside about needs for post hospital transition. Patient states he lives with wife [name] and son [name]. Modified independent, has all necessary DME's at home. Currently on 4L O2, does not use home O2. 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    PCP is Yolande Toribio MATSU, MD and uses [facility] on [address].   No ICM needs or recommendations noted at this time.  Patient not Medically ready for discharge.  CM will continue to follow as patient progresses with care towards discharge         Transition of Care Asessment: Insurance and Status: Insurance coverage has been reviewed Patient has primary care physician: Yes Home environment has been reviewed: Yes Prior level of function:: Modified Independent Prior/Current Home Services: No current home services Social Drivers of Health Review: SDOH reviewed no interventions necessary Readmission risk has been reviewed: Yes Transition of care needs: no transition of care needs at this time

## 2024-04-26 NOTE — Care Management CC44 (Signed)
 Condition Code 44 Documentation Completed  Patient Details  Name: THEO KRUMHOLZ MRN: [id number] Date of Birth: 10-Feb-1936   Condition Code 44 given:  Yes Patient signature on Condition Code 44 notice:  Yes Documentation of 2 MD's agreement:  Yes Code 44 added to claim:  Yes    Tom-Johnson, Harvest Muskrat, RN 04/26/2024, 5:07 PM

## 2024-05-01 ENCOUNTER — Inpatient Hospital Stay (HOSPITAL_COMMUNITY)
Admission: EM | Admit: 2024-05-01 | Discharge: 2024-05-05 | DRG: 964 | Disposition: A | Discharge status: Skilled Nursing Facility | Attending: Internal Medicine | Admitting: Internal Medicine

## 2024-05-01 ENCOUNTER — Other Ambulatory Visit: Payer: Self-pay

## 2024-05-01 ENCOUNTER — Encounter (HOSPITAL_COMMUNITY): Payer: Self-pay

## 2024-05-01 ENCOUNTER — Emergency Department (HOSPITAL_COMMUNITY)

## 2024-05-01 DIAGNOSIS — I739 Peripheral vascular disease, unspecified: Secondary | ICD-10-CM | POA: Diagnosis present

## 2024-05-01 DIAGNOSIS — I130 Hypertensive heart and chronic kidney disease with heart failure and stage 1 through stage 4 chronic kidney disease, or unspecified chronic kidney disease: Secondary | ICD-10-CM | POA: Diagnosis present

## 2024-05-01 DIAGNOSIS — Z79899 Other long term (current) drug therapy: Secondary | ICD-10-CM

## 2024-05-01 DIAGNOSIS — I10 Essential (primary) hypertension: Secondary | ICD-10-CM | POA: Diagnosis present

## 2024-05-01 DIAGNOSIS — M25551 Pain in right hip: Secondary | ICD-10-CM | POA: Diagnosis not present

## 2024-05-01 DIAGNOSIS — R0689 Other abnormalities of breathing: Secondary | ICD-10-CM | POA: Diagnosis present

## 2024-05-01 DIAGNOSIS — I5032 Chronic diastolic (congestive) heart failure: Secondary | ICD-10-CM | POA: Diagnosis present

## 2024-05-01 DIAGNOSIS — S32591A Other specified fracture of right pubis, initial encounter for closed fracture: Secondary | ICD-10-CM | POA: Diagnosis not present

## 2024-05-01 DIAGNOSIS — Z87891 Personal history of nicotine dependence: Secondary | ICD-10-CM

## 2024-05-01 DIAGNOSIS — K219 Gastro-esophageal reflux disease without esophagitis: Secondary | ICD-10-CM | POA: Diagnosis present

## 2024-05-01 DIAGNOSIS — N1832 Chronic kidney disease, stage 3b: Secondary | ICD-10-CM | POA: Diagnosis present

## 2024-05-01 DIAGNOSIS — Z7982 Long term (current) use of aspirin: Secondary | ICD-10-CM

## 2024-05-01 DIAGNOSIS — Z961 Presence of intraocular lens: Secondary | ICD-10-CM | POA: Diagnosis present

## 2024-05-01 DIAGNOSIS — S72001A Fracture of unspecified part of neck of right femur, initial encounter for closed fracture: Secondary | ICD-10-CM | POA: Diagnosis present

## 2024-05-01 DIAGNOSIS — N401 Enlarged prostate with lower urinary tract symptoms: Secondary | ICD-10-CM | POA: Diagnosis present

## 2024-05-01 DIAGNOSIS — S329XXA Fracture of unspecified parts of lumbosacral spine and pelvis, initial encounter for closed fracture: Principal | ICD-10-CM

## 2024-05-01 DIAGNOSIS — E785 Hyperlipidemia, unspecified: Secondary | ICD-10-CM | POA: Diagnosis present

## 2024-05-01 DIAGNOSIS — Z96642 Presence of left artificial hip joint: Secondary | ICD-10-CM | POA: Diagnosis present

## 2024-05-01 DIAGNOSIS — Z9842 Cataract extraction status, left eye: Secondary | ICD-10-CM

## 2024-05-01 DIAGNOSIS — Y92009 Unspecified place in unspecified non-institutional (private) residence as the place of occurrence of the external cause: Secondary | ICD-10-CM

## 2024-05-01 DIAGNOSIS — W010XXA Fall on same level from slipping, tripping and stumbling without subsequent striking against object, initial encounter: Secondary | ICD-10-CM | POA: Diagnosis present

## 2024-05-01 DIAGNOSIS — Z9841 Cataract extraction status, right eye: Secondary | ICD-10-CM

## 2024-05-01 LAB — CBC WITH DIFFERENTIAL/PLATELET
Abs Immature Granulocytes: 0.08 K/uL — ABNORMAL HIGH (ref 0.00–0.07)
Basophils Absolute: 0.1 K/uL (ref 0.0–0.1)
Basophils Relative: 1 %
Eosinophils Absolute: 0.1 K/uL (ref 0.0–0.5)
Eosinophils Relative: 2 %
HCT: 41.5 % (ref 39.0–52.0)
Hemoglobin: 13.6 g/dL (ref 13.0–17.0)
Immature Granulocytes: 1 %
Lymphocytes Relative: 5 %
Lymphs Abs: 0.3 K/uL — ABNORMAL LOW (ref 0.7–4.0)
MCH: 29.2 pg (ref 26.0–34.0)
MCHC: 32.8 g/dL (ref 30.0–36.0)
MCV: 89.2 fL (ref 80.0–100.0)
Monocytes Absolute: 0.4 K/uL (ref 0.1–1.0)
Monocytes Relative: 5 %
Neutro Abs: 6.6 K/uL (ref 1.7–7.7)
Neutrophils Relative %: 86 %
Platelets: 197 K/uL (ref 150–400)
RBC: 4.65 MIL/uL (ref 4.22–5.81)
RDW: 11.9 % (ref 11.5–15.5)
WBC: 7.6 K/uL (ref 4.0–10.5)
nRBC: 0 % (ref 0.0–0.2)

## 2024-05-01 LAB — TYPE AND SCREEN
ABO/RH(D): A POS
Antibody Screen: NEGATIVE

## 2024-05-01 LAB — BASIC METABOLIC PANEL WITH GFR
Anion gap: 12 (ref 5–15)
BUN: 18 mg/dL (ref 8–23)
CO2: 29 mmol/L (ref 22–32)
Calcium: 7.8 mg/dL — ABNORMAL LOW (ref 8.9–10.3)
Chloride: 96 mmol/L — ABNORMAL LOW (ref 98–111)
Creatinine, Ser: 1.33 mg/dL — ABNORMAL HIGH (ref 0.61–1.24)
GFR, Estimated: 52 mL/min — ABNORMAL LOW (ref >60)
Glucose, Bld: 177 mg/dL — ABNORMAL HIGH (ref 70–99)
Potassium: 4.4 mmol/L (ref 3.5–5.1)
Sodium: 137 mmol/L (ref 135–145)

## 2024-05-01 LAB — PROTIME-INR
INR: 1 (ref 0.8–1.2)
Prothrombin Time: 14 s (ref 11.4–15.2)

## 2024-05-01 MED ORDER — ROSUVASTATIN CALCIUM 10 MG PO TABS
10.0000 mg | ORAL_TABLET | Freq: Every day | ORAL | Status: DC
  Administered 2024-05-02 – 2024-05-05 (×4): 10 mg via ORAL
  Filled 2024-05-01 (×4): qty 1

## 2024-05-01 MED ORDER — ENOXAPARIN SODIUM 40 MG/0.4ML IJ SOSY
40.0000 mg | PREFILLED_SYRINGE | INTRAMUSCULAR | Status: DC
  Administered 2024-05-01 – 2024-05-04 (×4): 40 mg via SUBCUTANEOUS
  Filled 2024-05-01 (×4): qty 0.4

## 2024-05-01 MED ORDER — FUROSEMIDE 20 MG PO TABS
20.0000 mg | ORAL_TABLET | Freq: Every day | ORAL | Status: DC
  Administered 2024-05-02 – 2024-05-05 (×4): 20 mg via ORAL
  Filled 2024-05-01 (×4): qty 1

## 2024-05-01 MED ORDER — FENTANYL CITRATE (PF) 50 MCG/ML IJ SOSY
50.0000 ug | PREFILLED_SYRINGE | INTRAMUSCULAR | Status: AC | PRN
  Administered 2024-05-01 (×2): 50 ug via INTRAVENOUS
  Filled 2024-05-01 (×2): qty 1

## 2024-05-01 MED ORDER — AMLODIPINE BESYLATE 10 MG PO TABS
5.0000 mg | ORAL_TABLET | Freq: Every day | ORAL | Status: DC
  Administered 2024-05-01 – 2024-05-05 (×5): 5 mg via ORAL
  Filled 2024-05-01 (×5): qty 1

## 2024-05-01 MED ORDER — HYDROCODONE-ACETAMINOPHEN 5-325 MG PO TABS
1.0000 | ORAL_TABLET | ORAL | Status: DC | PRN
  Administered 2024-05-01: 1 via ORAL
  Administered 2024-05-02 – 2024-05-04 (×9): 2 via ORAL
  Administered 2024-05-04: 1 via ORAL
  Administered 2024-05-04 – 2024-05-05 (×3): 2 via ORAL
  Filled 2024-05-01: qty 1
  Filled 2024-05-01 (×10): qty 2
  Filled 2024-05-01: qty 1
  Filled 2024-05-01 (×2): qty 2

## 2024-05-01 MED ORDER — AMITRIPTYLINE HCL 25 MG PO TABS
50.0000 mg | ORAL_TABLET | Freq: Every day | ORAL | Status: DC
  Administered 2024-05-01 – 2024-05-04 (×4): 50 mg via ORAL
  Filled 2024-05-01 (×4): qty 2

## 2024-05-01 MED ORDER — FUROSEMIDE 40 MG PO TABS
20.0000 mg | ORAL_TABLET | Freq: Every day | ORAL | Status: DC | PRN
Start: 2024-05-01 — End: 2024-05-01

## 2024-05-01 MED ORDER — HYDROMORPHONE HCL 1 MG/ML IJ SOLN
0.5000 mg | INTRAMUSCULAR | Status: DC | PRN
  Administered 2024-05-01 – 2024-05-04 (×5): 0.5 mg via INTRAVENOUS
  Filled 2024-05-01 (×5): qty 0.5

## 2024-05-01 MED ORDER — ONDANSETRON HCL 4 MG/2ML IJ SOLN: 4.0000 mg | Freq: Four times a day (QID) | INTRAMUSCULAR | Status: DC | PRN

## 2024-05-01 MED ORDER — TAMSULOSIN HCL 0.4 MG PO CAPS
0.4000 mg | ORAL_CAPSULE | Freq: Every day | ORAL | Status: DC
  Administered 2024-05-01 – 2024-05-04 (×4): 0.4 mg via ORAL
  Filled 2024-05-01 (×4): qty 1

## 2024-05-01 MED ORDER — ONDANSETRON HCL 4 MG PO TABS: 4.0000 mg | ORAL_TABLET | Freq: Four times a day (QID) | ORAL | Status: DC | PRN

## 2024-05-01 MED ORDER — ASPIRIN 81 MG PO TBEC
81.0000 mg | DELAYED_RELEASE_TABLET | Freq: Every day | ORAL | Status: DC
Start: 2024-05-02 — End: 2024-05-05
  Administered 2024-05-02 – 2024-05-05 (×4): 81 mg via ORAL
  Filled 2024-05-01 (×4): qty 1

## 2024-05-01 MED ORDER — GABAPENTIN 100 MG PO CAPS
100.0000 mg | ORAL_CAPSULE | Freq: Two times a day (BID) | ORAL | Status: DC
  Administered 2024-05-01 – 2024-05-05 (×8): 100 mg via ORAL
  Filled 2024-05-01 (×8): qty 1

## 2024-05-01 MED ORDER — SENNOSIDES-DOCUSATE SODIUM 8.6-50 MG PO TABS
1.0000 | ORAL_TABLET | Freq: Two times a day (BID) | ORAL | Status: DC
  Administered 2024-05-01 – 2024-05-05 (×7): 1 via ORAL
  Filled 2024-05-01 (×7): qty 1

## 2024-05-01 MED ORDER — PANTOPRAZOLE SODIUM 40 MG PO TBEC
40.0000 mg | DELAYED_RELEASE_TABLET | Freq: Every day | ORAL | Status: DC
Start: 2024-05-02 — End: 2024-05-05
  Administered 2024-05-02 – 2024-05-05 (×4): 40 mg via ORAL
  Filled 2024-05-01 (×4): qty 1

## 2024-05-01 NOTE — H&P (Signed)
 History and Physical    COHL BEHRENS FMW:[id number] DOB: August 01, 1935 DOA: 05/01/2024  PCP: Yolande Toribio MATSU, MD   Patient coming from: Home    Chief Complaint: Fall,right hip pain  HPI: Perry COLAIZZI is a 88 y.o. male with medical history significant of HFpEF, peripheral artery disease status post left external iliac stenting, CKD stage III, hyperlipidemia who presented from home with complaint of right hip pain with movement after he fell last night. Patient was recently discharged on 10/21 after he was admitted for the acute toxic encephalopahty secondary to tramadol  use. As per the report, he was apparently all right till last night.  He lives with his wife and provides total support including ADLs for his wife.  He ambulates very well at his baseline though has remote history of left hip fracture.  He had to wake up last night at 1:00 am to help his wife.  He developed dizziness after he turned suddenly, making him fall on the hardwood floor.  Immediately developed pain in the right hip.  EMS was then called and he was brought to the emergency department. Patient seen and examined at bedside in the emergency department.  During my evaluation, he was hemodynamically stable, mildly hypertensive.  He was alert and oriented.  Complains of mild to moderate right hip pain.  No obvious deformity noted.  He was overall comfortable to my evaluation. No report of fever, chills, chest pain, palpitation, abdominal pain, nausea, vomiting, diarrhea.  ED Course: Hemodynamically stable in the emergency department.  Mildly hypertensive.  Lab work showed creatinine of 1.33 which is at his baseline.  Chest x-ray did not show any acute findings.  Hip x-ray showed right inferior pubic ramus .  Case was discussed with orthopedics Dr.Gabeaur who recommended conservative management with pain medication and physical therapy, no indication for operative intervention.  Patient was admitted for pain management and PT  evaluation.  Review of Systems: As per HPI otherwise 10 point review of systems negative.    Past Medical History:  Diagnosis Date   CHF (congestive heart failure) (HCC)    GERD (gastroesophageal reflux disease)    Heart murmur    Hypertension     Past Surgical History:  Procedure Laterality Date   ABDOMINAL AORTOGRAM W/LOWER EXTREMITY N/A 11/04/2022   Procedure: ABDOMINAL AORTOGRAM W/LOWER EXTREMITY;  Surgeon: Darron Deatrice LABOR, MD;  Location: [facility] INVASIVE [facility];  Service: Cardiovascular;  Laterality: N/A;   CATARACT EXTRACTION W/ INTRAOCULAR LENS  IMPLANT, BILATERAL     CHOLECYSTECTOMY     COLONOSCOPY     PERIPHERAL VASCULAR INTERVENTION Left 11/04/2022   Procedure: PERIPHERAL VASCULAR INTERVENTION;  Surgeon: Darron Deatrice LABOR, MD;  Location: [facility]AS[facility];  Service: Cardiovascular;  Laterality: Left;  External Illiac   TOTAL HIP ARTHROPLASTY Left 09/10/2022   Procedure: TOTAL HIP REPLACEMENT;  Surgeon: Edna Toribio LABOR, MD;  Location: [facility] OR;  Service: Orthopedics;  Laterality: Left;     reports that he quit smoking about 3 years ago. His smoking use included cigarettes. He started smoking about 63 years ago. He has a 30 pack-year smoking history. He has been exposed to tobacco smoke. He has never used smokeless tobacco. He reports that he does not drink alcohol  and does not use drugs.  No Known Allergies  Family History  Problem Relation Age of Onset   Colon cancer Neg Hx    Esophageal cancer Neg Hx    Rectal cancer Neg Hx    Stomach cancer  Neg Hx      Prior to Admission medications   Medication Sig Start Date End Date Taking? Authorizing Provider  amitriptyline  (ELAVIL ) 50 MG tablet Take 1 tablet (50 mg total) by mouth at bedtime. 09/17/22  Yes Angiulli, Toribio PARAS, PA-C  aspirin  EC 81 MG tablet Take 81 mg by mouth daily. Swallow whole.   Yes [provider]  calcium carbonate (TUMS - DOSED IN MG ELEMENTAL CALCIUM) 500 MG chewable tablet Chew 2-3 tablets by  mouth daily as needed for indigestion or heartburn.   Yes [provider]  furosemide  (LASIX ) 20 MG tablet Take 1 tablet (20 mg total) by mouth daily as needed (for weight gain of 3 lbs in 1 day or 5 lbs in 1 week). Patient taking differently: Take 20 mg by mouth in the morning. 04/26/24  Yes Tobie Yetta HERO, MD  gabapentin  (NEURONTIN ) 100 MG capsule Take 100 mg by mouth in the morning and at bedtime.   Yes [provider]  omeprazole  (PRILOSEC) 20 MG capsule Take 1 capsule (20 mg total) by mouth daily. Patient taking differently: Take 20 mg by mouth daily before breakfast. 09/17/22  Yes Angiulli, Toribio PARAS, PA-C  potassium chloride  (KLOR-CON ) 10 MEQ tablet TAKE 1 TABLET EVERY DAY Patient taking differently: Take 10 mEq by mouth in the morning. 09/15/23  Yes O'Neal, Darryle Ned, MD  rosuvastatin (CRESTOR) 10 MG tablet Take 10 mg by mouth daily.   Yes [provider]  tamsulosin  (FLOMAX ) 0.4 MG CAPS capsule Take 1 capsule (0.4 mg total) by mouth in the morning and at bedtime. 09/17/22  Yes Angiulli, Toribio PARAS, PA-C  calcium-vitamin D (OSCAL WITH D) 500-5 MG-MCG tablet Take 2 tablets by mouth 2 (two) times daily. Patient not taking: Reported on 05/01/2024 04/26/24   Tobie Yetta HERO, MD  polyethylene glycol (MIRALAX  / GLYCOLAX ) 17 g packet Take 17 g by mouth daily. Patient not taking: Reported on 05/01/2024 09/13/22   Drusilla Sabas RAMAN, MD  traMADol  (ULTRAM ) 50 MG tablet Take 1 tablet (50 mg total) by mouth every 12 (twelve) hours as needed. Patient not taking: Reported on 05/01/2024 04/26/24   Tobie Yetta HERO, MD    Physical Exam: Vitals:   05/01/24 1435 05/01/24 1445 05/01/24 1500 05/01/24 1613  BP: 110/85  133/79 (!) 161/83  Pulse: 88 90 89 90  Resp: (!) 27 (!) 21 20 20   Temp:    98.1 F (36.7 C)  TempSrc:    Oral  SpO2: 99% 93% 98% 97%    Constitutional: NAD, calm, comfortable Vitals:   05/01/24 1435 05/01/24 1445 05/01/24 1500 05/01/24 1613  BP: 110/85  133/79 (!)  161/83  Pulse: 88 90 89 90  Resp: (!) 27 (!) 21 20 20   Temp:    98.1 F (36.7 C)  TempSrc:    Oral  SpO2: 99% 93% 98% 97%   Eyes: PERRL, lids and conjunctivae normal ENMT: Mucous membranes are moist.  Neck: normal, supple, no masses, no thyromegaly Respiratory: clear to auscultation bilaterally, no wheezing, no crackles. Normal respiratory effort. No accessory muscle use.  Cardiovascular: Regular rate and rhythm, no murmurs / rubs / gallops. No extremity edema.  Abdomen: no tenderness, no masses palpated. No hepatosplenomegaly. Bowel sounds positive.  Musculoskeletal: no clubbing / cyanosis. No joint deformity upper and lower extremities.  Tenderness on the right hip Skin: no rashes, lesions, ulcers. No induration Neurologic: CN 2-12 grossly intact.  Strength 5/5 in all 4.  Psychiatric: Normal judgment and insight. Alert and  oriented x 3. Normal mood.   Foley Catheter:None  Labs on Admission: I have personally reviewed following labs and imaging studies  CBC: Recent Labs  Lab 04/25/24 1139 04/26/24 0448 05/01/24 1256  WBC 9.5 6.5 7.6  NEUTROABS 8.0*  --  6.6  HGB 12.7* 11.6* 13.6  HCT 38.2* 34.2* 41.5  MCV 90.5 88.6 89.2  PLT 142* 139* 197   Basic Metabolic Panel: Recent Labs  Lab 04/25/24 1139 04/26/24 0448 04/26/24 1335 05/01/24 1256  NA 137 137 135 137  K 4.6 3.7 4.3 4.4  CL 96* 101 95* 96*  CO2 28 26 26 29   GLUCOSE 135* 108* 200* 177*  BUN 29* 26* 24* 18  CREATININE 1.78* 1.52* 1.47* 1.33*  CALCIUM 7.0* 6.4* 6.6* 7.8*   GFR: Estimated Creatinine Clearance: 42.5 mL/min (A) (by C-G formula based on SCr of 1.33 mg/dL (H)). Liver Function Tests: Recent Labs  Lab 04/25/24 2013 04/26/24 0448  AST  --  15  ALT  --  10  ALKPHOS  --  43  BILITOT  --  0.7  PROT  --  5.5*  ALBUMIN  3.2* 2.8*   No results for input(s): LIPASE, AMYLASE in the last 168 hours. Recent Labs  Lab 04/25/24 1139  AMMONIA <13   Coagulation Profile: Recent Labs  Lab  05/01/24 1256  INR 1.0   Cardiac Enzymes: No results for input(s): CKTOTAL, CKMB, CKMBINDEX, TROPONINI in the last 168 hours. BNP (last 3 results) Recent Labs    04/25/24 1139  PROBNP 267.0   HbA1C: No results for input(s): HGBA1C in the last 72 hours. CBG: No results for input(s): GLUCAP in the last 168 hours. Lipid Profile: No results for input(s): CHOL, HDL, LDLCALC, TRIG, CHOLHDL, LDLDIRECT in the last 72 hours. Thyroid Function Tests: No results for input(s): TSH, T4TOTAL, FREET4, T3FREE, THYROIDAB in the last 72 hours. Anemia Panel: No results for input(s): VITAMINB12, FOLATE, FERRITIN, TIBC, IRON, RETICCTPCT in the last 72 hours. Urine analysis:    Component Value Date/Time   COLORURINE YELLOW 04/25/2024 1309   APPEARANCEUR CLEAR 04/25/2024 1309   LABSPEC 1.028 04/25/2024 1309   PHURINE 6.0 04/25/2024 1309   GLUCOSEU NEGATIVE 04/25/2024 1309   HGBUR NEGATIVE 04/25/2024 1309   BILIRUBINUR NEGATIVE 04/25/2024 1309   KETONESUR NEGATIVE 04/25/2024 1309   PROTEINUR 30 (A) 04/25/2024 1309   NITRITE NEGATIVE 04/25/2024 1309   LEUKOCYTESUR NEGATIVE 04/25/2024 1309    Radiological Exams on Admission: DG Chest 1 View Result Date: 05/01/2024 CLINICAL DATA:  Fall. EXAM: CHEST  1 VIEW COMPARISON:  04/17/2024 and CT chest 04/17/2024. FINDINGS: Trachea is midline. Heart size within normal limits. Lungs are clear. No pleural fluid. IMPRESSION: No acute findings. Electronically Signed   By: Newell Eke M.D.   On: 05/01/2024 14:18   DG Hip Unilat W or Wo Pelvis 2-3 Views Right Result Date: 05/01/2024 CLINICAL DATA:  Hip pain after a tripping injury and fall. EXAM: DG HIP (WITH OR WITHOUT PELVIS) 2-3V RIGHT COMPARISON:  09/10/2022. FINDINGS: There is offset of the right inferior pubic ramus. Right femur is intact. No dislocation. Left hip arthroplasty. IMPRESSION: Offset of the right inferior pubic ramus is new from 09/10/2022 and  indicative of a fracture. Electronically Signed   By: Newell Eke M.D.   On: 05/01/2024 14:17     Assessment/Plan Principal Problem:   Pain in right hip Active Problems:   PAD (peripheral artery disease)   Chronic heart failure with preserved ejection fraction (HFpEF, >= 50%) (HCC)  Closed right hip fracture (HCC)    Right inferior pubic rami fracture/right hip pain: As per the report, he  fell last night at his home.  Developed right hip pain mostly with movement.  No obvious deformity on presentation.  X-ray of the hip showed right inferior pubic rami fracture.Case was discussed with orthopedics Dr.Gabeaur who recommended conservative management with pain medication and physical therapy, no indication for operative intervention.  Patient was admitted for pain management and PT evaluation. PT consulted.  Continue pain medication, bowel regimen  CKD stage IIIa: Baseline creatinine is 1.3-1.4.  Currently kidney was at baseline.  Hypertension: Does not take any medication at home.  Blood pressure noted to be on the higher side in the emergency department.  Started amlodipine  5 mg daily  Chronic HFpEF: Currently looks euvolemic.  Last echo on 10/2022 showed EF of 55%.  Takes  Lasix  20 mg daily at home  History of peripheral artery disease: Status post left external iliac artery stenting.  Takes aspirin , rosuvastatin.  Respiratory insufficiency: Monitor shows Oxygen sat in the range of high 80s.  Denies any  shortness of breath.  Chest x-ray did not show any acute findings.  Will order incentive spirometer.  Continue to monitor.  Does not use oxygen at home.       Severity of Illness: The appropriate patient status for this patient is OBSERVATION. Observation status is judged to be reasonable and necessary in order to provide the required intensity of service to ensure the patient's safety. The patient's presenting symptoms, physical exam findings, and initial radiographic and  laboratory data in the context of their medical condition is felt to place them at decreased risk for further clinical deterioration. Furthermore, it is anticipated that the patient will be medically stable for discharge from the hospital within 2 midnights of admission.    DVT prophylaxis: Lovenox Code Status: Full code Family Communication: Called and discussed with son [name] on phone on 11/3 Consults called: Case discussed with orthopedics     Ivonne Mustache MD [facility]  05/01/2024, 4:47 PM

## 2024-05-01 NOTE — ED Notes (Signed)
 Pt was able to give specimen and was sent to the lab

## 2024-05-01 NOTE — ED Triage Notes (Signed)
 BIBA from home- tripped and fell last night. Pt has c/o right hip pain with movement, no obvious deformity. 168/90 bp 108 hr 93% r/a

## 2024-05-01 NOTE — ED Notes (Signed)
 Pt placed on nasal cannula 2 lpm after pain medication

## 2024-05-02 DIAGNOSIS — W010XXA Fall on same level from slipping, tripping and stumbling without subsequent striking against object, initial encounter: Secondary | ICD-10-CM | POA: Diagnosis present

## 2024-05-02 DIAGNOSIS — N401 Enlarged prostate with lower urinary tract symptoms: Secondary | ICD-10-CM | POA: Diagnosis present

## 2024-05-02 DIAGNOSIS — I739 Peripheral vascular disease, unspecified: Secondary | ICD-10-CM | POA: Diagnosis present

## 2024-05-02 DIAGNOSIS — Z96642 Presence of left artificial hip joint: Secondary | ICD-10-CM | POA: Diagnosis present

## 2024-05-02 DIAGNOSIS — S72001A Fracture of unspecified part of neck of right femur, initial encounter for closed fracture: Secondary | ICD-10-CM | POA: Diagnosis present

## 2024-05-02 DIAGNOSIS — Z79899 Other long term (current) drug therapy: Secondary | ICD-10-CM | POA: Diagnosis not present

## 2024-05-02 DIAGNOSIS — E785 Hyperlipidemia, unspecified: Secondary | ICD-10-CM | POA: Diagnosis present

## 2024-05-02 DIAGNOSIS — Z9841 Cataract extraction status, right eye: Secondary | ICD-10-CM | POA: Diagnosis not present

## 2024-05-02 DIAGNOSIS — N1832 Chronic kidney disease, stage 3b: Secondary | ICD-10-CM | POA: Diagnosis present

## 2024-05-02 DIAGNOSIS — R0689 Other abnormalities of breathing: Secondary | ICD-10-CM | POA: Diagnosis present

## 2024-05-02 DIAGNOSIS — S32591A Other specified fracture of right pubis, initial encounter for closed fracture: Secondary | ICD-10-CM | POA: Diagnosis present

## 2024-05-02 DIAGNOSIS — I5032 Chronic diastolic (congestive) heart failure: Secondary | ICD-10-CM | POA: Diagnosis present

## 2024-05-02 DIAGNOSIS — Z9842 Cataract extraction status, left eye: Secondary | ICD-10-CM | POA: Diagnosis not present

## 2024-05-02 DIAGNOSIS — I130 Hypertensive heart and chronic kidney disease with heart failure and stage 1 through stage 4 chronic kidney disease, or unspecified chronic kidney disease: Secondary | ICD-10-CM | POA: Diagnosis present

## 2024-05-02 DIAGNOSIS — Z87891 Personal history of nicotine dependence: Secondary | ICD-10-CM | POA: Diagnosis not present

## 2024-05-02 DIAGNOSIS — I10 Essential (primary) hypertension: Secondary | ICD-10-CM | POA: Diagnosis not present

## 2024-05-02 DIAGNOSIS — Z961 Presence of intraocular lens: Secondary | ICD-10-CM | POA: Diagnosis present

## 2024-05-02 DIAGNOSIS — Z7982 Long term (current) use of aspirin: Secondary | ICD-10-CM | POA: Diagnosis not present

## 2024-05-02 DIAGNOSIS — Y92009 Unspecified place in unspecified non-institutional (private) residence as the place of occurrence of the external cause: Secondary | ICD-10-CM | POA: Diagnosis not present

## 2024-05-02 DIAGNOSIS — M25551 Pain in right hip: Secondary | ICD-10-CM | POA: Diagnosis present

## 2024-05-02 DIAGNOSIS — K219 Gastro-esophageal reflux disease without esophagitis: Secondary | ICD-10-CM | POA: Diagnosis present

## 2024-05-02 LAB — CBC
HCT: 38.9 % — ABNORMAL LOW (ref 39.0–52.0)
Hemoglobin: 13.1 g/dL (ref 13.0–17.0)
MCH: 29.8 pg (ref 26.0–34.0)
MCHC: 33.7 g/dL (ref 30.0–36.0)
MCV: 88.4 fL (ref 80.0–100.0)
Platelets: 180 K/uL (ref 150–400)
RBC: 4.4 MIL/uL (ref 4.22–5.81)
RDW: 11.8 % (ref 11.5–15.5)
WBC: 8.8 K/uL (ref 4.0–10.5)
nRBC: 0 % (ref 0.0–0.2)

## 2024-05-02 LAB — BASIC METABOLIC PANEL WITH GFR
Anion gap: 13 (ref 5–15)
BUN: 18 mg/dL (ref 8–23)
CO2: 27 mmol/L (ref 22–32)
Calcium: 7.4 mg/dL — ABNORMAL LOW (ref 8.9–10.3)
Chloride: 96 mmol/L — ABNORMAL LOW (ref 98–111)
Creatinine, Ser: 1.26 mg/dL — ABNORMAL HIGH (ref 0.61–1.24)
GFR, Estimated: 56 mL/min — ABNORMAL LOW (ref >60)
Glucose, Bld: 141 mg/dL — ABNORMAL HIGH (ref 70–99)
Potassium: 3.6 mmol/L (ref 3.5–5.1)
Sodium: 137 mmol/L (ref 135–145)

## 2024-05-02 MED ORDER — METHOCARBAMOL 500 MG PO TABS
500.0000 mg | ORAL_TABLET | Freq: Four times a day (QID) | ORAL | Status: DC | PRN
Start: 2024-05-02 — End: 2024-05-05
  Administered 2024-05-02 – 2024-05-04 (×3): 500 mg via ORAL
  Filled 2024-05-02 (×3): qty 1

## 2024-05-02 NOTE — ED Provider Notes (Signed)
 [facility] FLOOR PROGRESSIVE CARE AND UROLOGY Provider Note   CSN: [id number] Arrival date & time: 05/01/24  1209     Patient presents with: Hip Pain   Perry Rosales is a 88 y.o. male.   HPI     88 year old patient comes in with chief complaint of hip pain. Patient accompanied by son.  Patient states that around 1:30 AM, he tripped and fell in his room.  His son heard the fall, went to help him.  He assisted him back into the bed.  This morning however, patient has significant pain and unable to bear any weight or even raise his extremity.  Therefore, patient has been brought to the emergency room.  Patient does not take any blood thinners  Patient denies any headache, neck pain.  Prior to Admission medications   Medication Sig Start Date End Date Taking? Authorizing Provider  amitriptyline  (ELAVIL ) 50 MG tablet Take 1 tablet (50 mg total) by mouth at bedtime. 09/17/22  Yes Angiulli, Toribio PARAS, PA-C  aspirin  EC 81 MG tablet Take 81 mg by mouth daily. Swallow whole.   Yes [provider]  augmented betamethasone dipropionate (DIPROLENE-AF) 0.05 % cream Apply 1 application  topically 2 (two) times daily as needed (for itching or inflammation).   Yes [provider]  calcium carbonate (TUMS - DOSED IN MG ELEMENTAL CALCIUM) 500 MG chewable tablet Chew 2-3 tablets by mouth daily as needed for indigestion or heartburn.   Yes [provider]  furosemide  (LASIX ) 20 MG tablet Take 1 tablet (20 mg total) by mouth daily as needed (for weight gain of 3 lbs in 1 day or 5 lbs in 1 week). Patient taking differently: Take 20 mg by mouth in the morning. 04/26/24  Yes Tobie Yetta HERO, MD  gabapentin  (NEURONTIN ) 100 MG capsule Take 100 mg by mouth in the morning and at bedtime.   Yes [provider]  omeprazole  (PRILOSEC) 20 MG capsule Take 1 capsule (20 mg total) by mouth daily. Patient taking differently: Take 20 mg by mouth daily before breakfast. 09/17/22   Yes Angiulli, Toribio PARAS, PA-C  potassium chloride  (KLOR-CON ) 10 MEQ tablet TAKE 1 TABLET EVERY DAY Patient taking differently: Take 10 mEq by mouth in the morning. 09/15/23  Yes O'Neal, Darryle Ned, MD  rosuvastatin (CRESTOR) 10 MG tablet Take 10 mg by mouth daily.   Yes [provider]  tamsulosin  (FLOMAX ) 0.4 MG CAPS capsule Take 1 capsule (0.4 mg total) by mouth in the morning and at bedtime. 09/17/22  Yes Angiulli, Daniel J, PA-C  triamcinolone cream (KENALOG) 0.1 % Apply 1 Application topically 2 (two) times daily as needed (for itching or irritation).   Yes [provider]  calcium-vitamin D (OSCAL WITH D) 500-5 MG-MCG tablet Take 2 tablets by mouth 2 (two) times daily. Patient not taking: Reported on 05/01/2024 04/26/24   Patel, Pranav M, MD  polyethylene glycol (MIRALAX  / GLYCOLAX ) 17 g packet Take 17 g by mouth daily. Patient not taking: Reported on 05/01/2024 09/13/22   Drusilla Sabas RAMAN, MD  traMADol  (ULTRAM ) 50 MG tablet Take 1 tablet (50 mg total) by mouth every 12 (twelve) hours as needed. Patient not taking: Reported on 05/01/2024 04/26/24   Tobie Yetta HERO, MD    Allergies: Patient has no known allergies.    Review of Systems  All other systems reviewed and are negative.   Updated Vital Signs BP (!) 149/79 (BP Location: Left Arm)   Pulse 89   Temp  97.9 F (36.6 C) (Oral)   Resp 18   Ht 5' 11 (1.803 m)   Wt 74.6 kg   SpO2 94%   BMI 22.94 kg/m   Physical Exam Vitals and nursing note reviewed.  Constitutional:      Appearance: He is well-developed.  HENT:     Head: Atraumatic.  Eyes:     Extraocular Movements: Extraocular movements intact.     Pupils: Pupils are equal, round, and reactive to light.  Cardiovascular:     Rate and Rhythm: Normal rate.  Pulmonary:     Effort: Pulmonary effort is normal.  Musculoskeletal:        General: Tenderness present. No swelling or deformity.     Cervical back: Neck supple.     Comments: Patient has tenderness  over the anterior right hip  Skin:    General: Skin is warm.  Neurological:     Mental Status: He is alert and oriented to person, place, and time.     (all labs ordered are listed, but only abnormal results are displayed) Labs Reviewed  BASIC METABOLIC PANEL WITH GFR - Abnormal; Notable for the following components:      Result Value   Chloride 96 (*)    Glucose, Bld 177 (*)    Creatinine, Ser 1.33 (*)    Calcium 7.8 (*)    GFR, Estimated 52 (*)    All other components within normal limits  CBC WITH DIFFERENTIAL/PLATELET - Abnormal; Notable for the following components:   Lymphs Abs 0.3 (*)    Abs Immature Granulocytes 0.08 (*)    All other components within normal limits  BASIC METABOLIC PANEL WITH GFR - Abnormal; Notable for the following components:   Chloride 96 (*)    Glucose, Bld 141 (*)    Creatinine, Ser 1.26 (*)    Calcium 7.4 (*)    GFR, Estimated 56 (*)    All other components within normal limits  CBC - Abnormal; Notable for the following components:   HCT 38.9 (*)    All other components within normal limits  PROTIME-INR  TYPE AND SCREEN    EKG: EKG Interpretation Date/Time:  Monday May 01 2024 12:24:05 EST Ventricular Rate:  96 PR Interval:  112 QRS Duration:  110 QT Interval:  380 QTC Calculation: 481 R Axis:   78  Text Interpretation: Sinus rhythm Borderline short PR interval Borderline T abnormalities, inferior leads Borderline prolonged QT interval Confirmed by Charlyn Sora ([id number]) on 05/02/2024 6:39:34 PM  Radiology: [facility] Chest 1 View Result Date: 05/01/2024 CLINICAL DATA:  Fall. EXAM: CHEST  1 VIEW COMPARISON:  04/17/2024 and CT chest 04/17/2024. FINDINGS: Trachea is midline. Heart size within normal limits. Lungs are clear. No pleural fluid. IMPRESSION: No acute findings. Electronically Signed   By: Newell Eke M.D.   On: 05/01/2024 14:18   DG Hip Unilat W or Wo Pelvis 2-3 Views Right Result Date: 05/01/2024 CLINICAL DATA:  Hip pain  after a tripping injury and fall. EXAM: DG HIP (WITH OR WITHOUT PELVIS) 2-3V RIGHT COMPARISON:  09/10/2022. FINDINGS: There is offset of the right inferior pubic ramus. Right femur is intact. No dislocation. Left hip arthroplasty. IMPRESSION: Offset of the right inferior pubic ramus is new from 09/10/2022 and indicative of a fracture. Electronically Signed   By: Newell Eke M.D.   On: 05/01/2024 14:17     Procedures   Medications Ordered in the ED  aspirin  EC tablet 81 mg (81 mg Oral Given 05/02/24 1100)  rosuvastatin (CRESTOR) tablet 10 mg (10 mg Oral Given 05/02/24 1100)  amitriptyline  (ELAVIL ) tablet 50 mg (50 mg Oral Given 05/01/24 2051)  pantoprazole  (PROTONIX ) EC tablet 40 mg (40 mg Oral Given 05/02/24 1100)  tamsulosin  (FLOMAX ) capsule 0.4 mg (0.4 mg Oral Given 05/02/24 1827)  gabapentin  (NEURONTIN ) capsule 100 mg (100 mg Oral Given 05/02/24 1059)  enoxaparin (LOVENOX) injection 40 mg (40 mg Subcutaneous Given 05/02/24 1828)  HYDROcodone-acetaminophen  (NORCO/VICODIN) 5-325 MG per tablet 1-2 tablet (2 tablets Oral Given 05/02/24 1518)  HYDROmorphone  (DILAUDID ) injection 0.5 mg (0.5 mg Intravenous Given 05/02/24 0017)  senna-docusate (Senokot-S) tablet 1 tablet (1 tablet Oral Given 05/02/24 1100)  ondansetron  (ZOFRAN ) tablet 4 mg (has no administration in time range)    Or  ondansetron  (ZOFRAN ) injection 4 mg (has no administration in time range)  amLODipine  (NORVASC ) tablet 5 mg (5 mg Oral Given 05/02/24 1059)  furosemide  (LASIX ) tablet 20 mg (20 mg Oral Given 05/02/24 1100)  methocarbamol  (ROBAXIN ) tablet 500 mg (500 mg Oral Given 05/02/24 1100)  fentaNYL  (SUBLIMAZE ) injection 50 mcg (50 mcg Intravenous Given 05/01/24 1433)                                    Medical Decision Making Amount and/or Complexity of Data Reviewed Labs: ordered. Radiology: ordered.  Risk Prescription drug management. Decision regarding hospitalization.   88year old patient comes in after sustaining what  appears to be a mechanical fall. Pertinent past medical includes PAD, CHF.  No blood thinner use. Collateral history provided by patient's son.  Based on my history and exam, differential diagnosis includes: - Traumatic brain injury including intracranial hemorrhage - Long bone fractures - Contusions - Soft tissue injury - Concussion  Based on the initial assessment, the following workup was initiated x-ray of the right hip. Patient is more than 12 hours out since the fall.  He has no headache, neck pain, no signs of trauma to the head.  At this time, I do not think CT scan of the brain will be helpful.  I have independently interpreted the following imaging from the perspective of acute trauma: X-ray of the hip and the results indicate pelvic fracture. No clear evidence of hip fracture noted.   Consulted orthopedic surgery.  Dr. Reyne indicated that patient has nonoperative injury.  For now plan is to admit the patient for pain control and rehab.  If he is unable to ambulate, then he might need MRI or CT of the pelvis to look for occult fracture in the hip that is surgical in nature.  Final diagnoses:  Closed displaced fracture of pelvis, unspecified part of pelvis, initial encounter [facility])    ED Discharge Orders     None          Charlyn Sora, MD 05/02/24 1840

## 2024-05-02 NOTE — Evaluation (Signed)
 Physical Therapy Evaluation Patient Details Name: Perry Rosales MRN: [id number] DOB: [birth year] Today's Date: 05/02/2024  History of Present Illness  88 y.o. male  who presented from home with complaint of right hip pain with movement after fall at home. Hip x-ray showed right inferior pubic ramus. Case was discussed with orthopedics Dr.Gabeaur who recommended conservative management with pain medication and physical therapy, no indication for operative intervention.  Patient was admitted for pain management and PT evaluation.  recent admission 10/21 for the acute toxic encephalopathy secondary to tramadol  use PMH: L THA posterior approach 09/10/22 , HFpEF, peripheral artery disease status post left external iliac stenting, CKD stage III, hyperlipidemia  Clinical Impression  Pt admitted with above diagnosis.  Pt is very independent at his baseline, caregiver for his wife/total assist; son also also assists with meal prep and their housekeeper assists wife with ADLs.  Pt was required +2 max assist for bed mobility, +2 min assist for transfers and short distance amb in room with RW. Mobility imitations greatly d/t pain, anticipate pt will progress well as pain resolves, will likely need post acute rehab to allow safe return to home setting.    Patient will benefit from continued inpatient follow up therapy, <3 hours/day    Pt currently with functional limitations due to the deficits listed below (see PT Problem List). Pt will benefit from acute skilled PT to increase their independence and safety with mobility to allow discharge.           If plan is discharge home, recommend the following: Two people to help with walking and/or transfers;Two people to help with bathing/dressing/bathroom;Help with stairs or ramp for entrance;Assist for transportation;Assistance with cooking/housework   Can travel by private vehicle   No    Equipment Recommendations None recommended by PT  Recommendations for  Other Services       Functional Status Assessment Patient has had a recent decline in their functional status and demonstrates the ability to make significant improvements in function in a reasonable and predictable amount of time.     Precautions / Restrictions Precautions Precautions: Fall Restrictions Weight Bearing Restrictions Per Provider Order: No Other Position/Activity Restrictions: WBAT      Mobility  Bed Mobility Overal bed mobility: Needs Assistance Bed Mobility: Supine to Sit     Supine to sit: Max assist, +2 for physical assistance, +2 for safety/equipment     General bed mobility comments: assist for LEs and to elevate trunk, incr time needed; bed pad used to complete pivot hips and scoot to EOB    Transfers Overall transfer level: Needs assistance Equipment used: Rolling walker (2 wheels) Transfers: Sit to/from Stand Sit to Stand: Min assist, +2 safety/equipment, +2 physical assistance           General transfer comment: multi-modal  cues for hand placement, LE position. assist to rise and transition to RW    Ambulation/Gait Ambulation/Gait assistance: Min assist, +2 safety/equipment Gait Distance (Feet): 5 Feet Assistive device: Rolling walker (2 wheels) Gait Pattern/deviations: Step-to pattern, Decreased stance time - left Gait velocity: decr     General Gait Details: cues for sequence, RW position, technique/use of UEs to offload LLE  Stairs            Wheelchair Mobility     Tilt Bed    Modified Rankin (Stroke Patients Only)       Balance Overall balance assessment: Needs assistance, History of Falls Sitting-balance support: Feet supported, Bilateral upper extremity supported, Single extremity  supported   Sitting balance - Comments: briefly able to static sit without UE support, ltd by pain   Standing balance support: During functional activity, Reliant on assistive device for balance Standing balance-Leahy Scale:  Zero Standing balance comment: heaviliy reliant on UEs                             Pertinent Vitals/Pain Pain Assessment Pain Assessment: Faces Faces Pain Scale: Hurts whole lot Pain Location: pelvic area/R side Pain Descriptors / Indicators: Sore, Guarding, Grimacing Pain Intervention(s): Limited activity within patient's tolerance, Monitored during session, Premedicated before session, Repositioned    Home Living Family/patient expects to be discharged to:: Private residence Living Arrangements: Spouse/significant other Available Help at Discharge: Family Type of Home: House Home Access: Stairs to enter;Ramped entrance   Entrance Stairs-Number of Steps: 2   Home Layout: One level Home Equipment: Rollator (4 wheels);Rolling Walker (2 wheels) Additional Comments: son assists with meals; housekeeper assists pt wife with ADLs, bathing; pt wife requires heavy assistance with transfers    Prior Function Prior Level of Function : Independent/Modified Independent;Driving             Mobility Comments: independent, drives, cares for spouse       Extremity/Trunk Assessment   Upper Extremity Assessment Upper Extremity Assessment: Defer to OT evaluation    Lower Extremity Assessment Lower Extremity Assessment: RLE deficits/detail;LLE deficits/detail RLE Deficits / Details: ankle WFL; knee and hip AROM limited by pain RLE: Unable to fully assess due to pain LLE Deficits / Details: AAROM grossly  WFL; 2+/5--MMT limited by pain LLE: Unable to fully assess due to pain       Communication   Communication Communication: No apparent difficulties    Cognition Arousal: Alert Behavior During Therapy: WFL for tasks assessed/performed   PT - Cognitive impairments: No apparent impairments                                 Cueing Cueing Techniques: Verbal cues, Tactile cues     General Comments      Exercises     Assessment/Plan    PT Assessment  Patient needs continued PT services  PT Problem List Decreased mobility;Decreased range of motion;Decreased activity tolerance;Decreased balance;Decreased knowledge of use of DME;Pain       PT Treatment Interventions DME instruction;Therapeutic exercise;Gait training;Functional mobility training;Therapeutic activities;Patient/family education;Balance training    PT Goals (Current goals can be found in the Care Plan section)  Acute Rehab PT Goals Patient Stated Goal: rehab or home PT Goal Formulation: With patient Time For Goal Achievement: 05/16/24 Potential to Achieve Goals: Good    Frequency Min 4X/week     Co-evaluation               AM-PAC PT 6 Clicks Mobility  Outcome Measure Help needed turning from your back to your side while in a flat bed without using bedrails?: Total Help needed moving from lying on your back to sitting on the side of a flat bed without using bedrails?: Total Help needed moving to and from a bed to a chair (including a wheelchair)?: A Lot Help needed standing up from a chair using your arms (e.g., wheelchair or bedside chair)?: A Lot Help needed to walk in hospital room?: A Lot Help needed climbing 3-5 steps with a railing? : Total 6 Click Score: 9    End of  Session Equipment Utilized During Treatment: Gait belt Activity Tolerance: Patient tolerated treatment well;Patient limited by pain Patient left: in chair;with call bell/phone within reach;with chair alarm set Nurse Communication: Mobility status PT Visit Diagnosis: Other abnormalities of gait and mobility (R26.89);History of falling (Z91.81);Pain Pain - Right/Left: Right Pain - part of body: Hip (pelvic area)    Time: 8553-8486 PT Time Calculation (min) (ACUTE ONLY): 27 min   Charges:   PT Evaluation $PT Eval Low Complexity: 1 Low PT Treatments $Gait Training: 8-22 mins PT General Charges $$ ACUTE PT VISIT: 1 Visit         Umaiza Matusik, PT  Acute Rehab Dept [facility])  [phone]  05/02/2024   [facility] 05/02/2024, 3:23 PM

## 2024-05-02 NOTE — Evaluation (Signed)
 Occupational Therapy Evaluation Patient Details Name: LABAN OROURKE MRN: [id number] DOB: July 24, 1935 Today's Date: 05/02/2024   History of Present Illness   88 yr old male  who presented from home with complaint of right hip pain with movement after fall at home. Hip x-ray showed right inferior pubic ramus fracture. Case was discussed with orthopedics Dr.Gabeaur who recommended conservative management with pain medication and physical therapy, no indication for operative intervention. Recent hospital  admission 10/21 for the acute toxic encephalopathy secondary to tramadol  use PMH: L THA posterior approach 09/10/22 , HFpEF, peripheral artery disease status post left external iliac stenting, CKD stage III, hyperlipidemia     Clinical Impressions The pt is currently presenting significantly below his baseline level of functioning for self-care management. He is limited by the below listed deficits (see OT problem list). As such, his occupational performance is compromised and he requires assist for self-care management. During the session today, he required mod to max assist for lower body self-care, mod assist to stand from the bedside chair, and mod assist to step-pivot from the chair to bed using a RW. He presented with slower & guarded movement during progressive activity, given increased R groin and pelvic pain. He will benefit from further OT services to maximize his ADL performance and to decrease the risk for restricted participation in meaningful activities. Short-term SNF rehab is recommended, however the pt is hoping to return home with home health therapy at discharge.      If plan is discharge home, recommend the following:   Help with stairs or ramp for entrance;A lot of help with walking and/or transfers;A lot of help with bathing/dressing/bathroom;Assistance with cooking/housework     Functional Status Assessment   Patient has had a recent decline in their functional status and  demonstrates the ability to make significant improvements in function in a reasonable and predictable amount of time.     Equipment Recommendations   None recommended by OT     Recommendations for Other Services         Precautions/Restrictions   Precautions Precautions: Fall Restrictions Weight Bearing Restrictions Per Provider Order: Yes Other Position/Activity Restrictions: WBAT     Mobility Bed Mobility Overal bed mobility: Needs Assistance Bed Mobility: Sit to Supine     Supine to sit: Mod assist (required assist for BLE)          Transfers Overall transfer level: Needs assistance Equipment used: Rolling walker (2 wheels) Transfers: Sit to/from Stand Sit to Stand: Mod assist           General transfer comment:  (Required increased assist from lower chair surface. Cues needed for hand placement and pushing more with LLE, due to increased RLE pain.)      Balance     Sitting balance-Leahy Scale: Fair Sitting balance - Comments:  (dynamic sitting balance limited by pain)     Standing balance-Leahy Scale: Poor          ADL either performed or assessed with clinical judgement   ADL Overall ADL's : Needs assistance/impaired Eating/Feeding: Independent;Sitting   Grooming: Set up;Sitting   Upper Body Bathing: Set up;Sitting   Lower Body Bathing: Moderate assistance;Sitting/lateral leans   Upper Body Dressing : Set up;Sitting   Lower Body Dressing: Maximal assistance;Sitting/lateral leans   Toilet Transfer: Moderate assistance;Stand-pivot;BSC/3in1;Rolling walker (2 wheels);Cueing for sequencing   Toileting- Clothing Manipulation and Hygiene: Maximal assistance;Sit to/from stand Toileting - Clothing Manipulation Details (indicate cue type and reason): at bedside commode level, based on  clinical judgement             Vision Baseline Vision/History: 1 Wears glasses Additional Comments: he correctly read the time depicted on the wall  clock.            Pertinent Vitals/Pain Pain Assessment Pain Assessment: 0-10 Pain Score: 8  Pain Location:  (R groin and R pelvis) Pain Descriptors / Indicators: Sore, Guarding, Grimacing Pain Intervention(s): Limited activity within patient's tolerance, Monitored during session, Repositioned     Extremity/Trunk Assessment Upper Extremity Assessment Upper Extremity Assessment: Overall WFL for tasks assessed;RUE deficits/detail;LUE deficits/detail;Left hand dominant RUE Deficits / Details: AROM  and strength WFL LUE Deficits / Details: AROM and strength WFL   Lower Extremity Assessment Lower Extremity Assessment: LLE deficits/detail RLE Deficits / Details: ankle WFL; knee and hip AROM limited by pain RLE: Unable to fully assess due to pain LLE Deficits / Details:  (AROM and strength WFL) LLE: Unable to fully assess due to pain       Communication Communication Communication: No apparent difficulties Factors Affecting Communication: Hearing impaired   Cognition Arousal: Alert Behavior During Therapy: WFL for tasks assessed/performed        OT - Cognition Comments: Oriented x4      Following commands: Intact       Cueing  General Comments   Cueing Techniques: Verbal cues              Home Living Family/patient expects to be discharged to:: Private residence Living Arrangements: Spouse/significant other;Children (spouse and son; his son works from home) Available Help at Discharge: Family Type of Home: House Home Access: Ramped entrance Entrance [place] of Steps: 2   Home Layout: One level     Bathroom Shower/Tub: Walk-in shower         Home Equipment: Shower seat - built in;BSC/3in1;Rollator (4 wheels);Rolling Walker (2 wheels);Cane - single point   Additional Comments: son assists with meals; housekeeper assists pt wife with ADLs, bathing; pt wife requires heavy assistance with transfers      Prior Functioning/Environment Prior Level of  Function : Independent/Modified Independent;Driving             Mobility Comments:  (Independent with ambulation.) ADLs Comments: He was independent with ADLs, driving, and caring for his spouse. He has a housekeeper for cleaning and his son did most of the cooking.    OT Problem List: Decreased activity tolerance;Impaired balance (sitting and/or standing);Decreased knowledge of use of DME or AE;Pain   OT Treatment/Interventions: Self-care/ADL training;Therapeutic exercise;Balance training;Energy conservation;Therapeutic activities;DME and/or AE instruction;Patient/family education      OT Goals(Current goals can be found in the care plan section)   Acute Rehab OT Goals Patient Stated Goal: decreased pain and to return home OT Goal Formulation: With patient Time For Goal Achievement: 05/16/24 Potential to Achieve Goals: Good ADL Goals Pt Will Transfer to Toilet: with contact guard assist;ambulating;grab bars Pt Will Perform Toileting - Clothing Manipulation and hygiene: with contact guard assist;sit to/from stand Additional ADL Goal #1: Patient will perform bed mobility with CGA, in prep for progressive ADL participation.   OT Frequency:  Min 2X/week       AM-PAC OT 6 Clicks Daily Activity     Outcome Measure Help from another person eating meals?: None Help from another person taking care of personal grooming?: A Little Help from another person toileting, which includes using toliet, bedpan, or urinal?: A Lot Help from another person bathing (including washing, rinsing, drying)?: A Lot Help from another  person to put on and taking off regular upper body clothing?: A Little Help from another person to put on and taking off regular lower body clothing?: A Lot 6 Click Score: 16   End of Session Equipment Utilized During Treatment: Rolling walker (2 wheels) Nurse Communication: Mobility status  Activity Tolerance: Patient limited by pain Patient left: in bed;with call  bell/phone within reach;with bed alarm set  OT Visit Diagnosis: Unsteadiness on feet (R26.81);Other abnormalities of gait and mobility (R26.89);Pain;History of falling (Z91.81) Pain - Right/Left: Right Pain - part of body:  (groin and pelvis)                Time: 8448-8379 OT Time Calculation (min): 29 min Charges:  OT General Charges $OT Visit: 1 Visit OT Evaluation $OT Eval Moderate Complexity: 1 Mod OT Treatments $Therapeutic Activity: 8-22 mins    Delanna JINNY Lesches, OTR/L 05/02/2024, 5:38 PM

## 2024-05-02 NOTE — Progress Notes (Signed)
 PROGRESS NOTE  Perry FALERO  FMW:[id number] DOB: May 09, 1936 DOA: 05/01/2024 PCP: Yolande Toribio MATSU, MD   Brief Narrative: Perry Rosales is a 88 y.o. male with medical history significant of HFpEF, peripheral artery disease status post left external iliac stenting, CKD stage III, hyperlipidemia who presented from home with complaint of right hip pain with movement after he fell last night. Patient was recently discharged on 10/21 after he was admitted for the acute toxic encephalopahty secondary to tramadol  use.  Hip x-ray showed right inferior pubic ramus .  Case was discussed with orthopedics Dr.Gabeaur who recommended conservative management with pain medication and physical therapy, no indication for operative intervention.  Patient was admitted for pain management and PT evaluation.  PT evaluation pending.  Assessment & Plan:  Principal Problem:   Pain in right hip Active Problems:   PAD (peripheral artery disease)   Chronic heart failure with preserved ejection fraction (HFpEF, >= 50%) (HCC)   Closed right hip fracture (HCC)   Right inferior pubic rami fracture/right hip pain: As per the report, he  fell last night at his home.  Developed right hip pain mostly with movement.  No obvious deformity on presentation.  X-ray of the hip showed right inferior pubic rami fracture.Case was discussed with orthopedics Dr.Gabeaur who recommended conservative management with pain medication and physical therapy, no indication for operative intervention.  Patient was admitted for pain management and PT evaluation.  Patient and son prefer home with home health but want to wait for better pain control and mobility before discharge. PT consulted.  Continue pain medication, bowel regimen   CKD stage IIIa: Baseline creatinine is 1.3-1.4.  Currently kidney was at baseline.   Hypertension: Does not take any medication at home.  Blood pressure noted to be on the higher side in the emergency department.   Started amlodipine  5 mg daily.BP stable this mrng   Chronic HFpEF: Currently looks euvolemic.  Last echo on 10/2022 showed EF of 55%.  Takes  Lasix  20 mg daily at home   History of peripheral artery disease: Status post left external iliac artery stenting.  Takes aspirin , rosuvastatin.   Respiratory insufficiency: Resolved.  Currently on room air.       DVT prophylaxis:enoxaparin (LOVENOX) injection 40 mg Start: 05/01/24 1700     Code Status: Full Code  Family Communication: Called and discussed with son [name] on 11/4  Patient status: Observation  Patient is from :Home  Anticipated discharge un:[facility] vs [facility]  Estimated DC date:1-2 days   Consultants: None  Procedures:None  Antimicrobials:  Anti-infectives (From admission, onward)    None       Subjective: Patient seen and examined at bedside today.  Hemodynamically stable.  Overall comfortable.  Lying on bed.  On room air this morning.  Complains of some spasm on the left groin.  Robaxin  ordered.  Pain well-controlled.  Waiting for PT evaluation  Objective: Vitals:   05/01/24 1809 05/01/24 2038 05/02/24 0258 05/02/24 0541  BP:  130/77 139/70 (!) 142/78  Pulse:  92 92 95  Resp:  16 18 15   Temp:  98.5 F (36.9 C) 97.7 F (36.5 C) 98.2 F (36.8 C)  TempSrc:  Oral Oral Oral  SpO2:  96% 95% 96%  Weight: 74.6 kg     Height: 5' 11 (1.803 m)       Intake/Output Summary (Last 24 hours) at 05/02/2024 1034 Last data filed at 05/02/2024 0544 Gross per 24 hour  Intake 200 ml  Output  550 ml  Net -350 ml   Filed Weights   05/01/24 1809  Weight: 74.6 kg    Examination:  General exam: Overall comfortable, not in distress, pleasant elderly gentleman HEENT: PERRL Respiratory system:  no wheezes or crackles  Cardiovascular system: S1 & S2 heard, RRR.  Gastrointestinal system: Abdomen is nondistended, soft and nontender. Central nervous system: Alert and oriented Extremities: No edema, no clubbing ,no cyanosis,  tenderness on the right hip Skin: No rashes, no ulcers,no icterus     Data Reviewed: I have personally reviewed following labs and imaging studies  CBC: Recent Labs  Lab 04/25/24 1139 04/26/24 0448 05/01/24 1256 05/02/24 0422  WBC 9.5 6.5 7.6 8.8  NEUTROABS 8.0*  --  6.6  --   HGB 12.7* 11.6* 13.6 13.1  HCT 38.2* 34.2* 41.5 38.9*  MCV 90.5 88.6 89.2 88.4  PLT 142* 139* 197 180   Basic Metabolic Panel: Recent Labs  Lab 04/25/24 1139 04/26/24 0448 04/26/24 1335 05/01/24 1256 05/02/24 0422  NA 137 137 135 137 137  K 4.6 3.7 4.3 4.4 3.6  CL 96* 101 95* 96* 96*  CO2 28 26 26 29 27   GLUCOSE 135* 108* 200* 177* 141*  BUN 29* 26* 24* 18 18  CREATININE 1.78* 1.52* 1.47* 1.33* 1.26*  CALCIUM 7.0* 6.4* 6.6* 7.8* 7.4*     Recent Results (from the past 240 hours)  Resp panel by RT-PCR (RSV, Flu A&B, Covid) Anterior Nasal Swab     Status: None   Collection Time: 04/25/24 11:43 AM   Specimen: Anterior Nasal Swab  Result Value Ref Range Status   SARS Coronavirus 2 by RT PCR NEGATIVE NEGATIVE Final    Comment: (NOTE) SARS-CoV-2 target nucleic acids are NOT DETECTED.  The SARS-CoV-2 RNA is generally detectable in upper respiratory specimens during the acute phase of infection. The lowest concentration of SARS-CoV-2 viral copies this assay can detect is 138 copies/mL. A negative result does not preclude SARS-Cov-2 infection and should not be used as the sole basis for treatment or other patient management decisions. A negative result may occur with  improper specimen collection/handling, submission of specimen other than nasopharyngeal swab, presence of viral mutation(s) within the areas targeted by this assay, and inadequate number of viral copies(<138 copies/mL). A negative result must be combined with clinical observations, patient history, and epidemiological information. The expected result is Negative.  Fact Sheet for Patients:   bloggercourse.com  Fact Sheet for Healthcare Providers:  seriousbroker.it  This test is no t yet approved or cleared by the [place]  FDA and  has been authorized for detection and/or diagnosis of SARS-CoV-2 by FDA under an Emergency Use Authorization (EUA). This EUA will remain  in effect (meaning this test can be used) for the duration of the COVID-19 declaration under Section 564(b)(1) of the Act, 21 U.S.C.section 360bbb-3(b)(1), unless the authorization is terminated  or revoked sooner.       Influenza A by PCR NEGATIVE NEGATIVE Final   Influenza B by PCR NEGATIVE NEGATIVE Final    Comment: (NOTE) The Xpert Xpress SARS-CoV-2/FLU/RSV plus assay is intended as an aid in the diagnosis of influenza from Nasopharyngeal swab specimens and should not be used as a sole basis for treatment. Nasal washings and aspirates are unacceptable for Xpert Xpress SARS-CoV-2/FLU/RSV testing.  Fact Sheet for Patients: bloggercourse.com  Fact Sheet for Healthcare Providers: seriousbroker.it  This test is not yet approved or cleared by the [place]  FDA and has been authorized for detection and/or diagnosis  of SARS-CoV-2 by FDA under an Emergency Use Authorization (EUA). This EUA will remain in effect (meaning this test can be used) for the duration of the COVID-19 declaration under Section 564(b)(1) of the Act, 21 U.S.C. section 360bbb-3(b)(1), unless the authorization is terminated or revoked.     Resp Syncytial Virus by PCR NEGATIVE NEGATIVE Final    Comment: (NOTE) Fact Sheet for Patients: bloggercourse.com  Fact Sheet for Healthcare Providers: seriousbroker.it  This test is not yet approved or cleared by the [place]  FDA and has been authorized for detection and/or diagnosis of SARS-CoV-2 by FDA under an Emergency Use  Authorization (EUA). This EUA will remain in effect (meaning this test can be used) for the duration of the COVID-19 declaration under Section 564(b)(1) of the Act, 21 U.S.C. section 360bbb-3(b)(1), unless the authorization is terminated or revoked.  Performed at [facility], [address], [place], [place] [address]      Radiology Studies: DG Chest 1 View Result Date: 05/01/2024 CLINICAL DATA:  Fall. EXAM: CHEST  1 VIEW COMPARISON:  04/17/2024 and CT chest 04/17/2024. FINDINGS: Trachea is midline. Heart size within normal limits. Lungs are clear. No pleural fluid. IMPRESSION: No acute findings. Electronically Signed   By: Newell Eke M.D.   On: 05/01/2024 14:18   DG Hip Unilat W or Wo Pelvis 2-3 Views Right Result Date: 05/01/2024 CLINICAL DATA:  Hip pain after a tripping injury and fall. EXAM: DG HIP (WITH OR WITHOUT PELVIS) 2-3V RIGHT COMPARISON:  09/10/2022. FINDINGS: There is offset of the right inferior pubic ramus. Right femur is intact. No dislocation. Left hip arthroplasty. IMPRESSION: Offset of the right inferior pubic ramus is new from 09/10/2022 and indicative of a fracture. Electronically Signed   By: Newell Eke M.D.   On: 05/01/2024 14:17    Scheduled Meds:  amitriptyline   50 mg Oral QHS   amLODipine   5 mg Oral Daily   aspirin  EC  81 mg Oral Daily   enoxaparin (LOVENOX) injection  40 mg Subcutaneous Q24H   furosemide   20 mg Oral Daily   gabapentin   100 mg Oral BID   pantoprazole   40 mg Oral Daily   rosuvastatin  10 mg Oral Daily   senna-docusate  1 tablet Oral BID   tamsulosin   0.4 mg Oral QPC supper   Continuous Infusions:   LOS: 0 days   Ivonne Mustache, MD [facility] P11/09/2023, 10:34 AM

## 2024-05-03 ENCOUNTER — Encounter (HOSPITAL_COMMUNITY): Payer: Self-pay | Admitting: Internal Medicine

## 2024-05-03 DIAGNOSIS — I5032 Chronic diastolic (congestive) heart failure: Secondary | ICD-10-CM

## 2024-05-03 DIAGNOSIS — S32591A Other specified fracture of right pubis, initial encounter for closed fracture: Secondary | ICD-10-CM | POA: Diagnosis not present

## 2024-05-03 DIAGNOSIS — I10 Essential (primary) hypertension: Secondary | ICD-10-CM | POA: Diagnosis not present

## 2024-05-03 DIAGNOSIS — I739 Peripheral vascular disease, unspecified: Secondary | ICD-10-CM | POA: Diagnosis not present

## 2024-05-03 MED ORDER — HYDROCODONE-ACETAMINOPHEN 5-325 MG PO TABS: 1.0000 | ORAL_TABLET | Freq: Four times a day (QID) | ORAL | 0 refills | Status: DC | PRN

## 2024-05-03 MED ORDER — PHENOL 1.4 % MT LIQD
1.0000 | OROMUCOSAL | Status: DC | PRN
  Administered 2024-05-03: 1 via OROMUCOSAL
  Filled 2024-05-03: qty 177

## 2024-05-03 NOTE — Progress Notes (Signed)
 Occupational Therapy Treatment Patient Details Name: Perry Rosales MRN: [id number] DOB: 02/20/1936 Today's Date: 05/03/2024   History of present illness 88 yr old male who presented from home with complaint of right hip pain with movement after fall at home. Hip x-ray showed right inferior pubic ramus fracture. Case was discussed with orthopedics Dr.Gabeaur who recommended conservative management with pain medication and physical therapy, no indication for operative intervention.    He had a recent hospital admission on 10/21 for the acute toxic encephalopathy secondary to tramadol  use PMH: L THA posterior approach 09/10/22 , HFpEF, peripheral artery disease status post left external iliac stenting, CKD stage III, hyperlipidemia   OT comments  The pt was motivated to participate in the session. He was seen for ADL instruction, progression of functional activity, and self-care equipment recommendations and training. He was instructed on compensatory techniques to perform lower body self-care tasks, as he has difficulty in this regard, due to R groin and pelvic pain; specifically, he was educated on using a reacher to doff socks, sock to donn socks, and reacher to donn lower body clothing, such as underwear and pants. It was further recommended for him to place his bedside commode  frame over his toilet at home to make a higher toilet surface with hand rails, needed to facilitate improved ease with toilet transfers. A long-handled sponge and performing seated bathing was also recommended. He required min assist for sit to stand, as well as for short distance ambulation using a RW. He reported 4/10 R groin and pelvis pain with activity. Short-term SNF rehab is recommended at current, however he is hopeful to return home with home health therapy at discharge. Continue OT plan of care.         If plan is discharge home, recommend the following:  Help with stairs or ramp for entrance;A lot of help with  bathing/dressing/bathroom;Assistance with cooking/housework;A little help with walking and/or transfers   Equipment Recommendations  Other (comment) (reacher, sock aid, and long-handled sponge)    Recommendations for Other Services      Precautions / Restrictions Precautions Precautions: Fall Restrictions Weight Bearing Restrictions Per Provider Order: Yes RLE Weight Bearing Per Provider Order: Weight bearing as tolerated       Mobility Bed Mobility Overal bed mobility: Needs Assistance Bed Mobility: Supine to Sit     Supine to sit: Min assist, HOB elevated, Used rails          Transfers Overall transfer level: Needs assistance Equipment used: Rolling walker (2 wheels) Transfers: Sit to/from Stand Sit to Stand: Min assist, From elevated surface           General transfer comment: pt was instructed to push more with his less painful LLE, as well as for upright posture once in standing     Balance     Sitting balance-Leahy Scale: Fair         Standing balance comment: CGA to min assist with RW        ADL either performed or assessed with clinical judgement   ADL Overall ADL's : Needs assistance/impaired               Lower Body Bathing Details (indicate cue type and reason): OT recommended a long-handled sponge of use of a shower seat for bathing tasks in the home. The pt was a walk-in shower with a built-in shower seat at home.     Lower Body Dressing: Sitting/lateral leans;Cueing for compensatory techniques;Cueing for sequencing;With adaptive equipment;Moderate  assistance Lower Body Dressing Details (indicate cue type and reason): The pt has increased difficulty with performing lower body dressing tasks at current, given R groin and pelvic pain. As such, he was instructed on using a reacher to doff socks, sock aid to donn socks and reacher to donn lower body clothing articles such as pants or underwear. He performed teach back on using a reacher to  doff socks and sock aid to donn socks while seated EOB.   Toilet Transfer Details (indicate cue type and reason): OT educated the pt on the option of placing his bedside commode frame over his toilet at home to make a higher toilet surface with hand rails, needed to facilitate improved ease with toilet transfers.       Tub/Shower Transfer Details (indicate cue type and reason): OT educated the pt on the proper technique for stepping into his walk-in shower at home, emphasizing stepping into the shower with his less painful left lower extremity first.                  Communication Communication Communication: No apparent difficulties Factors Affecting Communication: Hearing impaired   Cognition Arousal: Alert Behavior During Therapy: WFL for tasks assessed/performed          OT - Cognition Comments: Oriented x4      Following commands: Intact        Cueing   Cueing Techniques: Verbal cues             Pertinent Vitals/ Pain       Pain Assessment Pain Assessment: 0-10 Pain Score: 4  Pain Location: R groin and pelvis with activity Pain Descriptors / Indicators: Sore, Guarding, Grimacing Pain Intervention(s): Limited activity within patient's tolerance, Monitored during session, Repositioned   Frequency  Min 2X/week        Progress Toward Goals  OT Goals(current goals can now be found in the care plan section)     Acute Rehab OT Goals Patient Stated Goal: decreased pain and to return home at discharge OT Goal Formulation: With patient Time For Goal Achievement: 05/16/24 Potential to Achieve Goals: Good  Plan         AM-PAC OT 6 Clicks Daily Activity     Outcome Measure   Help from another person eating meals?: None Help from another person taking care of personal grooming?: A Little Help from another person toileting, which includes using toliet, bedpan, or urinal?: A Lot Help from another person bathing (including washing, rinsing, drying)?: A  Lot Help from another person to put on and taking off regular upper body clothing?: A Little Help from another person to put on and taking off regular lower body clothing?: A Lot 6 Click Score: 16    End of Session Equipment Utilized During Treatment: Rolling walker (2 wheels)  OT Visit Diagnosis: Unsteadiness on feet (R26.81);Other abnormalities of gait and mobility (R26.89);Pain;History of falling (Z91.81) Pain - Right/Left: Right Pain - part of body:  (groin and pelvis)   Activity Tolerance Patient limited by pain   Patient Left in chair;with call bell/phone within reach   Nurse Communication Mobility status        Time: 8884-8795 OT Time Calculation (min): 49 min  Charges: OT General Charges $OT Visit: 1 Visit OT Treatments $Self Care/Home Management : 23-37 mins $Therapeutic Activity: 8-22 mins    Delanna JINNY Lesches, OTR/L 05/03/2024, 3:08 PM

## 2024-05-03 NOTE — Assessment & Plan Note (Signed)
 05/03/24 inferior pubic ramus fracture is treated nonoperatively.  Patient was only able to walk 20 feet with therapy.  Patient told TOC and physical therapy that he is not going to a nursing home.  Wants to be discharged to home.   05/04/24 after pt spoke with his son [name], pt wants to go to SNF now. TOC aware. Pt's son [name] lives with pt and pt's wife in [place] in pt's home. Continue with limited supply of norco. 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  05/05/24 DC to SNF today. [facility].

## 2024-05-03 NOTE — Plan of Care (Signed)

## 2024-05-03 NOTE — Assessment & Plan Note (Signed)
 05/03/24 stable.  05/04/24 stable  on norvasc  5 mg daily and lasix  20 mg daily.  05/05/24. Continue on norvasc  5 mg daily and lasix  20 mg daily

## 2024-05-03 NOTE — TOC Initial Note (Signed)
 Transition of Care [facility]) - Initial/Assessment Note    Patient Details  Name: Perry Rosales MRN: [id number] Date of Birth: 02/14/1936  Transition of Care [facility]) CM/SW Contact:    Tawni CHRISTELLA Eva, LCSW Phone Number: 05/03/2024, 3:06 PM  Clinical Narrative:                   CSW met with pt at bedside to discuss rec for SNF placement. Pt has declined and would like to return home with home health services. Pt would prefer [facility] for [facility] services. Will need HH orders placed. Pt reports no DME needs. Care management to follow.   Expected Discharge Plan: Home w Home Health Services Barriers to Discharge: Continued Medical Work up   Patient Goals and CMS Choice Patient states their goals for this hospitalization and ongoing recovery are:: retrun home with home health          Expected Discharge Plan and Services       Living arrangements for the past 2 months: Single Family Home                           HH Arranged: PT, OT HH Agency: [facility] Date [facility] Agency Contacted: 05/03/24 Time HH Agency Contacted: [phone]    Prior Living Arrangements/Services Living arrangements for the past 2 months: Single Family Home Lives with:: Self, Adult Children, Spouse Patient language and need for interpreter reviewed:: Yes Do you feel safe going back to the place where you live?: Yes      Need for Family Participation in Patient Care: No (Comment) Care giver support system in place?: No (comment) Current home services: DME Criminal Activity/Legal Involvement Pertinent to Current Situation/Hospitalization: No - Comment as needed  Activities of Daily Living   ADL Screening (condition at time of admission) Independently performs ADLs?: Yes (appropriate for developmental age) Is the patient deaf or have difficulty hearing?: No Does the patient have difficulty seeing, even when wearing glasses/contacts?: No Does the patient have difficulty concentrating, remembering, or  making decisions?: No  Permission Sought/Granted                  Emotional Assessment Appearance:: Appears stated age Attitude/Demeanor/Rapport: Gracious Affect (typically observed): Accepting Orientation: : Oriented to Place, Oriented to  Time, Oriented to Situation, Oriented to Self   Psych Involvement: No (comment)  Admission diagnosis:  Pain in right hip [M25.551] Closed displaced fracture of pelvis, unspecified part of pelvis, initial encounter (HCC) [S32.9XXA] Patient Active Problem List   Diagnosis Date Noted   Pain in right hip 05/01/2024   Closed right hip fracture (HCC) 05/01/2024   Toxic encephalopathy 04/26/2024   Acute metabolic encephalopathy 04/25/2024   PAD (peripheral artery disease) 04/25/2024   Chronic heart failure with preserved ejection fraction (HFpEF, >= 50%) (HCC) 04/25/2024   Adverse effect of tramadol  04/25/2024   Fracture of left hip (HCC) 09/12/2022   Acute systolic heart failure (HCC) 09/11/2022   Closed left hip fracture (HCC) 09/08/2022   Leg edema 09/08/2022   Benign prostatic hyperplasia with lower urinary tract symptoms 07/17/2021   Bladder outlet obstruction 07/17/2021   Acute urinary retention    UTI (urinary tract infection) 02/09/2021   Acute kidney injury superimposed on chronic kidney disease 02/09/2021   Encounter for general adult medical examination without abnormal findings 09/18/2019   Essential hypertension 06/15/2019   Gastro-esophageal reflux disease without esophagitis 06/15/2019   Postherpetic neuralgia 06/15/2019   Tobacco  user 06/15/2019   PCP:  Yolande Toribio MATSU, MD Pharmacy:   [phone] - [place], [place] - [address] AT [address] OF [facility] [address] [place] [address] Phone: [phone] Fax: [phone]     Social Drivers of Health (SDOH) Social History: SDOH Screenings   Food Insecurity: No Food Insecurity (05/01/2024)  Housing: Low Risk  (05/01/2024)   Transportation Needs: No Transportation Needs (05/01/2024)  Utilities: Not At Risk (05/01/2024)  Social Connections: Moderately Integrated (05/01/2024)  Tobacco Use: Medium Risk (05/01/2024)   SDOH Interventions:     Readmission Risk Interventions    04/26/2024   11:17 AM  Readmission Risk Prevention Plan  Transportation Screening Complete  PCP or Specialist Appt within 5-7 Days Complete  Home Care Screening Complete  Medication Review (RN CM) Referral to Pharmacy

## 2024-05-03 NOTE — Assessment & Plan Note (Signed)
 05/03/24 chronic. Stable. Euvolemic.  05/04/24 stable. On lasix  20 mg daily.  05/05/24 Continue on Lasix  20 mg daily.  He is not on any beta-blockers.

## 2024-05-03 NOTE — Progress Notes (Signed)
   Went back this afternoon to discuss the patient's decision with him.  Patient has been seen by physical therapy and by TOC.  He adamantly refuses to go to nursing home.  Discussed with him that he does not have any other issues that need to be addressed while in the hospital and has reached maximum hospital benefit.  Patient was quite perturbed when I told him that he was going to be discharged today.  I told him that his decision to not go to a rehab facility means that he does not have a reason to be in the hospital anymore.  Patient started getting very irritated.  I did call the patient's son [name].  Discussed the case with [name] his son.  [name] states that he is coming up to the hospital talked with the patient.  Prescriptions being sent to his outpatient pharmacy for opiates.  If the patient continues to refuse nursing home placement, then patient will be discharged to home this evening.    Camellia Door, DO [facility]

## 2024-05-03 NOTE — Progress Notes (Signed)
   Discussed via phone with pt's son [name]. Pt is now agreeable to going to SNF for rehab.  Camellia Door, DO [facility]

## 2024-05-03 NOTE — Subjective & Objective (Signed)
 Pt seen and examined. He eating his breakfast fruit plate. He states he really likes fruit. Pt has chose [facility] for rehab. CM says there is a bed available today.

## 2024-05-03 NOTE — Assessment & Plan Note (Signed)
 05/03/24 chronic. F/u with PCP  05/04/24 stable  05/05/24 continue ASA 81 mg daily, Crestor 10 mg daily.

## 2024-05-03 NOTE — Hospital Course (Signed)
 CC: right hip pain after fall HPI: Perry Rosales is a 88 y.o. male with medical history significant of HFpEF, peripheral artery disease status post left external iliac stenting, CKD stage III, hyperlipidemia who presented from home with complaint of right hip pain with movement after he fell last night. Patient was recently discharged on 10/21 after he was admitted for the acute toxic encephalopahty secondary to tramadol  use. As per the report, he was apparently all right till last night.  He lives with his wife and provides total support including ADLs for his wife.  He ambulates very well at his baseline though has remote history of left hip fracture.  He had to wake up last night at 1:00 am to help his wife.  He developed dizziness after he turned suddenly, making him fall on the hardwood floor.  Immediately developed pain in the right hip.  EMS was then called and he was brought to the emergency department. Patient seen and examined at bedside in the emergency department.  During my evaluation, he was hemodynamically stable, mildly hypertensive.  He was alert and oriented.  Complains of mild to moderate right hip pain.  No obvious deformity noted.  He was overall comfortable to my evaluation. No report of fever, chills, chest pain, palpitation, abdominal pain, nausea, vomiting, diarrhea.   ED Course: Hemodynamically stable in the emergency department.  Mildly hypertensive.  Lab work showed creatinine of 1.33 which is at his baseline.  Chest x-ray did not show any acute findings.  Hip x-ray showed right inferior pubic ramus .  Case was discussed with orthopedics Dr.Gabeaur who recommended conservative management with pain medication and physical therapy, no indication for operative intervention.  Patient was admitted for pain management and PT evaluation.  Significant Events: Admitted 05/01/2024 for right inf pubic ramus fracture   Admission Labs: WBC 7.6, HgB 13.6, plt 197 INR 1.0 Na 137, K 4.4,  CO2 of 29, BUN 18, Scr 1.33, glu 177  Admission Imaging Studies: Pelvic XR Offset of the right inferior p ubic ramus is new from 09/10/2022 and indicative of a fracture. CXR No acute findings.   Significant Labs:   Significant Imaging Studies:   Antibiotic Therapy: Anti-infectives (From admission, onward)    None       Procedures:   Consultants:

## 2024-05-03 NOTE — Progress Notes (Signed)
 Pt with of urine output.  Urine is concentrated and amber in color.  Encouraged pt to increase his water intake. Pt verbalized understanding

## 2024-05-03 NOTE — Progress Notes (Signed)
 PROGRESS NOTE    Perry Rosales  FMW:[id number] DOB: 19-May-1936 DOA: 05/01/2024 PCP: Yolande Toribio MATSU, MD  Subjective: Patient seen and examined.  No family at bedside.  Patient states that he is the caregiver for his wife.  He has a son that works from home that is currently living at his house.  Patient refuses to go to a nursing home and wants to go home.   Hospital Course: CC: right hip pain after fall HPI: Perry Rosales is a 88 y.o. male with medical history significant of HFpEF, peripheral artery disease status post left external iliac stenting, CKD stage III, hyperlipidemia who presented from home with complaint of right hip pain with movement after he fell last night. Patient was recently discharged on 10/21 after he was admitted for the acute toxic encephalopahty secondary to tramadol  use. As per the report, he was apparently all right till last night.  He lives with his wife and provides total support including ADLs for his wife.  He ambulates very well at his baseline though has remote history of left hip fracture.  He had to wake up last night at 1:00 am to help his wife.  He developed dizziness after he turned suddenly, making him fall on the hardwood floor.  Immediately developed pain in the right hip.  EMS was then called and he was brought to the emergency department. Patient seen and examined at bedside in the emergency department.  During my evaluation, he was hemodynamically stable, mildly hypertensive.  He was alert and oriented.  Complains of mild to moderate right hip pain.  No obvious deformity noted.  He was overall comfortable to my evaluation. No report of fever, chills, chest pain, palpitation, abdominal pain, nausea, vomiting, diarrhea.   ED Course: Hemodynamically stable in the emergency department.  Mildly hypertensive.  Lab work showed creatinine of 1.33 which is at his baseline.  Chest x-ray did not show any acute findings.  Hip x-ray showed right inferior pubic  ramus .  Case was discussed with orthopedics Dr.Gabeaur who recommended conservative management with pain medication and physical therapy, no indication for operative intervention.  Patient was admitted for pain management and PT evaluation.  Significant Events: Admitted 05/01/2024 for right inf pubic ramus fracture   Admission Labs: WBC 7.6, HgB 13.6, plt 197 INR 1.0 Na 137, K 4.4, CO2 of 29, BUN 18, Scr 1.33, glu 177  Admission Imaging Studies: Pelvic XR Offset of the right inferior p ubic ramus is new from 09/10/2022 and indicative of a fracture. CXR No acute findings.   Significant Labs:   Significant Imaging Studies:   Antibiotic Therapy: Anti-infectives (From admission, onward)    None       Procedures:   Consultants:     Assessment and Plan: * Closed fracture of right inferior pubic ramus (HCC) 05/03/24 inferior pubic ramus fracture is treated nonoperatively.  Patient was only able to walk 20 feet with therapy.  Patient told TOC and physical therapy that he is not going to a nursing home.  Wants to be discharged to home.   Chronic heart failure with preserved ejection fraction (HFpEF, >= 50%) (HCC) 05/03/24 chronic. Stable. Euvolemic.   PAD (peripheral artery disease) 05/03/24 chronic. F/u with PCP   Essential hypertension 05/03/24 stable.    DVT prophylaxis: enoxaparin (LOVENOX) injection 40 mg Start: 05/01/24 1700     Code Status: Full Code Family Communication: no family at bedside Disposition Plan: return home vs SNF Reason for continuing need for  hospitalization: medically stable. Does not need further hospitalization.  Objective: Vitals:   05/02/24 1437 05/02/24 2034 05/03/24 0509 05/03/24 1338  BP: (!) 149/79 129/65 136/67 110/60  Pulse: 89 (!) 102 95 (!) 110  Resp: 18 14 20 18   Temp: 97.9 F (36.6 C) 98.6 F (37 C) 98 F (36.7 C) 98 F (36.7 C)  TempSrc: Oral Oral  Oral  SpO2: 94% 92% 96% 92%  Weight:      Height:         Intake/Output Summary (Last 24 hours) at 05/03/2024 1656 Last data filed at 05/03/2024 1330 Gross per 24 hour  Intake 940 ml  Output 600 ml  Net 340 ml   Filed Weights   05/01/24 1809  Weight: 74.6 kg    Examination:  Physical Exam Vitals and nursing note reviewed.  Constitutional:      General: He is not in acute distress.    Appearance: He is not toxic-appearing or diaphoretic.  HENT:     Head: Normocephalic and atraumatic.  Cardiovascular:     Rate and Rhythm: Normal rate and regular rhythm.  Pulmonary:     Effort: Pulmonary effort is normal.     Breath sounds: Normal breath sounds.  Abdominal:     General: Bowel sounds are normal.     Palpations: Abdomen is soft.  Musculoskeletal:     Right lower leg: No edema.     Left lower leg: No edema.  Skin:    General: Skin is warm and dry.     Capillary Refill: Capillary refill takes less than 2 seconds.  Neurological:     General: No focal deficit present.     Mental Status: He is alert and oriented to person, place, and time.     Data Reviewed: I have personally reviewed following labs and imaging studies  CBC: Recent Labs  Lab 05/01/24 1256 05/02/24 0422  WBC 7.6 8.8  NEUTROABS 6.6  --   HGB 13.6 13.1  HCT 41.5 38.9*  MCV 89.2 88.4  PLT 197 180   Basic Metabolic Panel: Recent Labs  Lab 05/01/24 1256 05/02/24 0422  NA 137 137  K 4.4 3.6  CL 96* 96*  CO2 29 27  GLUCOSE 177* 141*  BUN 18 18  CREATININE 1.33* 1.26*  CALCIUM 7.8* 7.4*   GFR: Estimated Creatinine Clearance: 44.4 mL/min (A) (by C-G formula based on SCr of 1.26 mg/dL (H)). Coagulation Profile: Recent Labs  Lab 05/01/24 1256  INR 1.0   Recent Results (from the past 240 hours)  Resp panel by RT-PCR (RSV, Flu A&B, Covid) Anterior Nasal Swab     Status: None   Collection Time: 04/25/24 11:43 AM   Specimen: Anterior Nasal Swab  Result Value Ref Range Status   SARS Coronavirus 2 by RT PCR NEGATIVE NEGATIVE Final    Comment:  (NOTE) SARS-CoV-2 target nucleic acids are NOT DETECTED.  The SARS-CoV-2 RNA is generally detectable in upper respiratory specimens during the acute phase of infection. The lowest concentration of SARS-CoV-2 viral copies this assay can detect is 138 copies/mL. A negative result does not preclude SARS-Cov-2 infection and should not be used as the sole basis for treatment or other patient management decisions. A negative result may occur with  improper specimen collection/handling, submission of specimen other than nasopharyngeal swab, presence of viral mutation(s) within the areas targeted by this assay, and inadequate number of viral copies(<138 copies/mL). A negative result must be combined with clinical observations, patient history, and epidemiological information.  The expected result is Negative.  Fact Sheet for Patients:  bloggercourse.com  Fact Sheet for Healthcare Providers:  seriousbroker.it  This test is no t yet approved or cleared by the [place]  FDA and  has been authorized for detection and/or diagnosis of SARS-CoV-2 by FDA under an Emergency Use Authorization (EUA). This EUA will remain  in effect (meaning this test can be used) for the duration of the COVID-19 declaration under Section 564(b)(1) of the Act, 21 U.S.C.section 360bbb-3(b)(1), unless the authorization is terminated  or revoked sooner.       Influenza A by PCR NEGATIVE NEGATIVE Final   Influenza B by PCR NEGATIVE NEGATIVE Final    Comment: (NOTE) The Xpert Xpress SARS-CoV-2/FLU/RSV plus assay is intended as an aid in the diagnosis of influenza from Nasopharyngeal swab specimens and should not be used as a sole basis for treatment. Nasal washings and aspirates are unacceptable for Xpert Xpress SARS-CoV-2/FLU/RSV testing.  Fact Sheet for Patients: bloggercourse.com  Fact Sheet for Healthcare  Providers: seriousbroker.it  This test is not yet approved or cleared by the [place]  FDA and has been authorized for detection and/or diagnosis of SARS-CoV-2 by FDA under an Emergency Use Authorization (EUA). This EUA will remain in effect (meaning this test can be used) for the duration of the COVID-19 declaration under Section 564(b)(1) of the Act, 21 U.S.C. section 360bbb-3(b)(1), unless the authorization is terminated or revoked.     Resp Syncytial Virus by PCR NEGATIVE NEGATIVE Final    Comment: (NOTE) Fact Sheet for Patients: bloggercourse.com  Fact Sheet for Healthcare Providers: seriousbroker.it  This test is not yet approved or cleared by the [place]  FDA and has been authorized for detection and/or diagnosis of SARS-CoV-2 by FDA under an Emergency Use Authorization (EUA). This EUA will remain in effect (meaning this test can be used) for the duration of the COVID-19 declaration under Section 564(b)(1) of the Act, 21 U.S.C. section 360bbb-3(b)(1), unless the authorization is terminated or revoked.  Performed at [facility], [address], [place], [place] [address]     Scheduled Meds:  amitriptyline   50 mg Oral QHS   amLODipine   5 mg Oral Daily   aspirin  EC  81 mg Oral Daily   enoxaparin (LOVENOX) injection  40 mg Subcutaneous Q24H   furosemide   20 mg Oral Daily   gabapentin   100 mg Oral BID   pantoprazole   40 mg Oral Daily   rosuvastatin  10 mg Oral Daily   senna-docusate  1 tablet Oral BID   tamsulosin   0.4 mg Oral QPC supper   Continuous Infusions:   LOS: 1 day   Time spent: 55 minutes  Camellia Door, DO  [facility]  05/03/2024, 4:56 PM

## 2024-05-04 ENCOUNTER — Encounter (HOSPITAL_COMMUNITY): Payer: Self-pay | Admitting: Internal Medicine

## 2024-05-04 DIAGNOSIS — S32591A Other specified fracture of right pubis, initial encounter for closed fracture: Secondary | ICD-10-CM | POA: Diagnosis not present

## 2024-05-04 DIAGNOSIS — I5032 Chronic diastolic (congestive) heart failure: Secondary | ICD-10-CM | POA: Diagnosis not present

## 2024-05-04 DIAGNOSIS — N1832 Chronic kidney disease, stage 3b: Secondary | ICD-10-CM | POA: Diagnosis not present

## 2024-05-04 DIAGNOSIS — I739 Peripheral vascular disease, unspecified: Secondary | ICD-10-CM | POA: Diagnosis not present

## 2024-05-04 MED ORDER — HYDROCODONE-ACETAMINOPHEN 5-325 MG PO TABS: 1.0000 | ORAL_TABLET | Freq: Four times a day (QID) | ORAL | 0 refills | Status: AC | PRN

## 2024-05-04 NOTE — TOC Progression Note (Signed)
 Transition of Care [facility]) - Progression Note    Patient Details  Name: Perry Rosales MRN: [id number] Date of Birth: [birth year]  Transition of [facility]) CM/SW Contact  Tawni CHRISTELLA Eva, LCSW Phone Number: 05/04/2024, 11:14 AM  Clinical Narrative:     CSW met with pt at bedside, pt has changed his mind and would like SNF placement. CSW discussed the process again pt will need insurance auth. CSW to fax pt's information out. Care management to follow.    Expected Discharge Plan: Home w Home Health Services Barriers to Discharge: Continued Medical Work up               Expected Discharge Plan and Services       Living arrangements for the past 2 months: Single Family Home                           HH Arranged: PT, OT [facility] Agency: [facility] Date [facility] Agency Contacted: 05/03/24 Time HH Agency Contacted: [phone]     Social Drivers of Health (SDOH) Interventions SDOH Screenings   Food Insecurity: No Food Insecurity (05/01/2024)  Housing: Low Risk  (05/01/2024)  Transportation Needs: No Transportation Needs (05/01/2024)  Utilities: Not At Risk (05/01/2024)  Social Connections: Moderately Integrated (05/01/2024)  Tobacco Use: Medium Risk (05/01/2024)    Readmission Risk Interventions    04/26/2024   11:17 AM  Readmission Risk Prevention Plan  Transportation Screening Complete  PCP or Specialist Appt within 5-7 Days Complete  Home Care Screening Complete  Medication Review (RN CM) Referral to Pharmacy

## 2024-05-04 NOTE — [place] FL2 (Signed)
 [place]  MEDICAID FL2 LEVEL OF CARE FORM     IDENTIFICATION  Patient Name: Perry Rosales Birthdate: [birth year] Sex: male Admission Date (Current Location): 05/01/2024  [facility] and [place] Number:  Producer, Television/film/video and Address:  [facility],  [address] [place], [place] [address]      Provider Number: [id number]  Attending Physician Name and Address:  Laurence Locus, DO  Relative Name and Phone Number:  Yassir, Enis (Son)  [phone] [facility])    Current Level of Care: Hospital Recommended Level of Care: Skilled Nursing Facility Prior Approval Number:    Date Approved/Denied:   PASRR Number: [id number]  Discharge Plan: SNF    Current Diagnoses: Patient Active Problem List   Diagnosis Date Noted   CKD stage 3b, GFR 30-44 ml/min (HCC) - baseline scr 1.3-1.5 05/04/2024   Closed fracture of right inferior pubic ramus (HCC) 05/01/2024   PAD (peripheral artery disease) 04/25/2024   Chronic heart failure with preserved ejection fraction (HFpEF, >= 50%) (HCC) 04/25/2024   Benign prostatic hyperplasia with lower urinary tract symptoms 07/17/2021   Bladder outlet obstruction 07/17/2021   Encounter for general adult medical examination without abnormal findings 09/18/2019   Essential hypertension 06/15/2019   Gastro-esophageal reflux disease without esophagitis 06/15/2019   Postherpetic neuralgia 06/15/2019   Tobacco user 06/15/2019    Orientation RESPIRATION BLADDER Height & Weight     Self, Time, Situation, Place  Normal Incontinent Weight: 164 lb 7.4 oz (74.6 kg) Height:  5' 11 (180.3 cm)  BEHAVIORAL SYMPTOMS/MOOD NEUROLOGICAL BOWEL NUTRITION STATUS      Continent Diet  AMBULATORY STATUS COMMUNICATION OF NEEDS Skin   Limited Assist Verbally Normal                       Personal Care Assistance Level of Assistance  Feeding, Dressing, Total care, Bathing Bathing Assistance: Limited assistance Feeding assistance: Independent Dressing Assistance:  Limited assistance Total Care Assistance: Limited assistance   Functional Limitations Info  Sight, Hearing, Speech Sight Info: Adequate Hearing Info: Adequate Speech Info: Adequate    SPECIAL CARE FACTORS FREQUENCY  PT (By licensed PT), OT (By licensed OT)     PT Frequency: 5 x a week OT Frequency: 5 x a week            Contractures Contractures Info: Not present    Additional Factors Info  Code Status, Allergies Code Status Info: full Allergies Info: NKA           Current Medications (05/04/2024):  This is the current hospital active medication list Current Facility-Administered Medications  Medication Dose Route Frequency Provider Last Rate Last Admin   amitriptyline  (ELAVIL ) tablet 50 mg  50 mg Oral QHS Adhikari, Amrit, MD   50 mg at 05/03/24 2130   amLODipine  (NORVASC ) tablet 5 mg  5 mg Oral Daily Jillian Buttery, MD   5 mg at 05/04/24 0933   aspirin  EC tablet 81 mg  81 mg Oral Daily Adhikari, Amrit, MD   81 mg at 05/04/24 0933   enoxaparin (LOVENOX) injection 40 mg  40 mg Subcutaneous Q24H Jillian Buttery, MD   40 mg at 05/03/24 1720   furosemide  (LASIX ) tablet 20 mg  20 mg Oral Daily Jillian Buttery, MD   20 mg at 05/04/24 0934   gabapentin  (NEURONTIN ) capsule 100 mg  100 mg Oral BID Jillian Buttery, MD   100 mg at 05/04/24 0933   HYDROcodone-acetaminophen  (NORCO/VICODIN) 5-325 MG per tablet 1-2 tablet  1-2 tablet Oral  Q4H PRN Adhikari, Amrit, MD   2 tablet at 05/04/24 0301   HYDROmorphone  (DILAUDID ) injection 0.5 mg  0.5 mg Intravenous Q4H PRN Adhikari, Amrit, MD   0.5 mg at 05/04/24 0553   methocarbamol  (ROBAXIN ) tablet 500 mg  500 mg Oral Q6H PRN Jillian Buttery, MD   500 mg at 05/02/24 2036   ondansetron  (ZOFRAN ) tablet 4 mg  4 mg Oral Q6H PRN Jillian Buttery, MD       Or   ondansetron  (ZOFRAN ) injection 4 mg  4 mg Intravenous Q6H PRN Adhikari, Amrit, MD       pantoprazole  (PROTONIX ) EC tablet 40 mg  40 mg Oral Daily Adhikari, Amrit, MD   40 mg at 05/04/24 0933    phenol (CHLORASEPTIC) mouth spray 1 spray  1 spray Mouth/Throat PRN Laurence Locus, DO   1 spray at 05/03/24 1217   rosuvastatin (CRESTOR) tablet 10 mg  10 mg Oral Daily Jillian Buttery, MD   10 mg at 05/04/24 0933   senna-docusate (Senokot-S) tablet 1 tablet  1 tablet Oral BID Adhikari, Amrit, MD   1 tablet at 05/04/24 0934   tamsulosin  (FLOMAX ) capsule 0.4 mg  0.4 mg Oral QPC supper Jillian Buttery, MD   0.4 mg at 05/03/24 1720     Discharge Medications: Please see discharge summary for a list of discharge medications.  Relevant Imaging Results:  Relevant Lab Results:   Additional Information SSN [id number]  Tawni HERO Enya Bureau, LCSW

## 2024-05-04 NOTE — Progress Notes (Signed)
 PROGRESS NOTE    [name]  FMW:[id number] DOB: 05/19/1936 DOA: 05/01/2024 PCP: Yolande Toribio MATSU, MD  Subjective: Pt seen and examined. He apologized to me for his behavior yesterday. I told him that his apology was not necessary but I accepted it and forgave him.  After pt talked to his son Perry Rosales at my insistence, pt has agreed to go to SNF for rehab.   Hospital Course: CC: right hip pain after fall HPI: Perry Rosales is a 88 y.o. male with medical history significant of HFpEF, peripheral artery disease status post left external iliac stenting, CKD stage III, hyperlipidemia who presented from home with complaint of right hip pain with movement after he fell last night. Patient was recently discharged on 10/21 after he was admitted for the acute toxic encephalopahty secondary to tramadol  use. As per the report, he was apparently all right till last night.  He lives with his wife and provides total support including ADLs for his wife.  He ambulates very well at his baseline though has remote history of left hip fracture.  He had to wake up last night at 1:00 am to help his wife.  He developed dizziness after he turned suddenly, making him fall on the hardwood floor.  Immediately developed pain in the right hip.  EMS was then called and he was brought to the emergency department. Patient seen and examined at bedside in the emergency department.  During my evaluation, he was hemodynamically stable, mildly hypertensive.  He was alert and oriented.  Complains of mild to moderate right hip pain.  No obvious deformity noted.  He was overall comfortable to my evaluation. No report of fever, chills, chest pain, palpitation, abdominal pain, nausea, vomiting, diarrhea.   ED Course: Hemodynamically stable in the emergency department.  Mildly hypertensive.  Lab work showed creatinine of 1.33 which is at his baseline.  Chest x-ray did not show any acute findings.  Hip x-ray showed right inferior pubic  ramus .  Case was discussed with orthopedics Dr.Gabeaur who recommended conservative management with pain medication and physical therapy, no indication for operative intervention.  Patient was admitted for pain management and PT evaluation.  Significant Events: Admitted 05/01/2024 for right inf pubic ramus fracture   Admission Labs: WBC 7.6, HgB 13.6, plt 197 INR 1.0 Na 137, K 4.4, CO2 of 29, BUN 18, Scr 1.33, glu 177  Admission Imaging Studies: Pelvic XR Offset of the right inferior p ubic ramus is new from 09/10/2022 and indicative of a fracture. CXR No acute findings.   Significant Labs:   Significant Imaging Studies:   Antibiotic Therapy: Anti-infectives (From admission, onward)    None       Procedures:   Consultants:     Assessment and Plan: * Closed fracture of right inferior pubic ramus (HCC) 05/03/24 inferior pubic ramus fracture is treated nonoperatively.  Patient was only able to walk 20 feet with therapy.  Patient told TOC and physical therapy that he is not going to a nursing home.  Wants to be discharged to home.   05/04/24 after pt spoke with his son Perry Rosales, pt wants to go to SNF now. TOC aware. Pt's son Perry Rosales lives with pt and pt's wife in [place] in pt's home. Continue with limited supply of norco. 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   Chronic heart failure with preserved ejection fraction (HFpEF, >= 50%) (HCC) 05/03/24 chronic. Stable. Euvolemic.  05/04/24 stable. On lasix  20 mg  daily.   PAD (peripheral artery disease) 05/03/24 chronic. F/u with PCP  05/04/24 stable   CKD stage 3b, GFR 30-44 ml/min (HCC) - baseline scr 1.3-1.5 05/04/24 baseline scr 1.3-1.5. stable.   Essential hypertension 05/03/24 stable.  05/04/24 stable  on norvasc  5 mg daily and lasix  20 mg daily.   DVT prophylaxis: enoxaparin (LOVENOX) injection 40 mg Start: 05/01/24 1700    Code Status: Full Code Family Communication: spoke with son Perry Rosales  yesterday evening Disposition Plan: SNF Reason for continuing need for hospitalization: medically stable for DC.  Objective: Vitals:   05/03/24 0509 05/03/24 1338 05/03/24 2036 05/04/24 0521  BP: 136/67 110/60 137/71 (!) 151/72  Pulse: 95 (!) 110 (!) 108 99  Resp: 20 18 16 16   Temp: 98 F (36.7 C) 98 F (36.7 C) 98.3 F (36.8 C) 98.2 F (36.8 C)  TempSrc:  Oral Oral Oral  SpO2: 96% 92% 93% 98%  Weight:      Height:        Intake/Output Summary (Last 24 hours) at 05/04/2024 1019 Last data filed at 05/04/2024 0945 Gross per 24 hour  Intake 770 ml  Output 700 ml  Net 70 ml   Filed Weights   05/01/24 1809  Weight: 74.6 kg    Examination:  Physical Exam Vitals and nursing note reviewed.  Constitutional:      General: He is not in acute distress.    Appearance: He is normal weight. He is not toxic-appearing or diaphoretic.  HENT:     Head: Normocephalic and atraumatic.  Eyes:     General: No scleral icterus. Cardiovascular:     Rate and Rhythm: Normal rate and regular rhythm.  Pulmonary:     Effort: Pulmonary effort is normal. No respiratory distress.     Breath sounds: Normal breath sounds.  Abdominal:     General: Bowel sounds are normal.     Palpations: Abdomen is soft.  Musculoskeletal:     Right lower leg: No edema.     Left lower leg: No edema.  Skin:    General: Skin is warm and dry.     Capillary Refill: Capillary refill takes less than 2 seconds.  Neurological:     Mental Status: He is alert and oriented to person, place, and time.     Data Reviewed: I have personally reviewed following labs and imaging studies  CBC: Recent Labs  Lab 05/01/24 1256 05/02/24 0422  WBC 7.6 8.8  NEUTROABS 6.6  --   HGB 13.6 13.1  HCT 41.5 38.9*  MCV 89.2 88.4  PLT 197 180   Basic Metabolic Panel: Recent Labs  Lab 05/01/24 1256 05/02/24 0422  NA 137 137  K 4.4 3.6  CL 96* 96*  CO2 29 27  GLUCOSE 177* 141*  BUN 18 18  CREATININE 1.33* 1.26*  CALCIUM  7.8* 7.4*   GFR: Estimated Creatinine Clearance: 44.4 mL/min (A) (by C-G formula based on SCr of 1.26 mg/dL (H)).  Coagulation Profile: Recent Labs  Lab 05/01/24 1256  INR 1.0    Recent Results (from the past 240 hours)  Resp panel by RT-PCR (RSV, Flu A&B, Covid) Anterior Nasal Swab     Status: None   Collection Time: 04/25/24 11:43 AM   Specimen: Anterior Nasal Swab  Result Value Ref Range Status   SARS Coronavirus 2 by RT PCR NEGATIVE NEGATIVE Final    Comment: (NOTE) SARS-CoV-2 target nucleic acids are NOT DETECTED.  The SARS-CoV-2 RNA is generally detectable in upper respiratory  specimens during the acute phase of infection. The lowest concentration of SARS-CoV-2 viral copies this assay can detect is 138 copies/mL. A negative result does not preclude SARS-Cov-2 infection and should not be used as the sole basis for treatment or other patient management decisions. A negative result may occur with  improper specimen collection/handling, submission of specimen other than nasopharyngeal swab, presence of viral mutation(s) within the areas targeted by this assay, and inadequate number of viral copies(<138 copies/mL). A negative result must be combined with clinical observations, patient history, and epidemiological information. The expected result is Negative.  Fact Sheet for Patients:  bloggercourse.com  Fact Sheet for Healthcare Providers:  seriousbroker.it  This test is no t yet approved or cleared by the [place]  FDA and  has been authorized for detection and/or diagnosis of SARS-CoV-2 by FDA under an Emergency Use Authorization (EUA). This EUA will remain  in effect (meaning this test can be used) for the duration of the COVID-19 declaration under Section 564(b)(1) of the Act, 21 U.S.C.section 360bbb-3(b)(1), unless the authorization is terminated  or revoked sooner.       Influenza A by PCR NEGATIVE NEGATIVE  Final   Influenza B by PCR NEGATIVE NEGATIVE Final    Comment: (NOTE) The Xpert Xpress SARS-CoV-2/FLU/RSV plus assay is intended as an aid in the diagnosis of influenza from Nasopharyngeal swab specimens and should not be used as a sole basis for treatment. Nasal washings and aspirates are unacceptable for Xpert Xpress SARS-CoV-2/FLU/RSV testing.  Fact Sheet for Patients: bloggercourse.com  Fact Sheet for Healthcare Providers: seriousbroker.it  This test is not yet approved or cleared by the [place]  FDA and has been authorized for detection and/or diagnosis of SARS-CoV-2 by FDA under an Emergency Use Authorization (EUA). This EUA will remain in effect (meaning this test can be used) for the duration of the COVID-19 declaration under Section 564(b)(1) of the Act, 21 U.S.C. section 360bbb-3(b)(1), unless the authorization is terminated or revoked.     Resp Syncytial Virus by PCR NEGATIVE NEGATIVE Final    Comment: (NOTE) Fact Sheet for Patients: bloggercourse.com  Fact Sheet for Healthcare Providers: seriousbroker.it  This test is not yet approved or cleared by the [place]  FDA and has been authorized for detection and/or diagnosis of SARS-CoV-2 by FDA under an Emergency Use Authorization (EUA). This EUA will remain in effect (meaning this test can be used) for the duration of the COVID-19 declaration under Section 564(b)(1) of the Act, 21 U.S.C. section 360bbb-3(b)(1), unless the authorization is terminated or revoked.  Performed at [facility], [address] [place], [place] [address]      Scheduled Meds:  amitriptyline   50 mg Oral QHS   amLODipine   5 mg Oral Daily   aspirin  EC  81 mg Oral Daily   enoxaparin (LOVENOX) injection  40 mg Subcutaneous Q24H   furosemide   20 mg Oral Daily   gabapentin   100 mg Oral BID   pantoprazole   40 mg  Oral Daily   rosuvastatin  10 mg Oral Daily   senna-docusate  1 tablet Oral BID   tamsulosin   0.4 mg Oral QPC supper   Continuous Infusions:   LOS: 2 days   Time spent: 55 minutes  Camellia Door, DO  [facility]  05/04/2024, 10:19 AM

## 2024-05-04 NOTE — Assessment & Plan Note (Signed)
 05/04/24 baseline scr 1.3-1.5. stable.  05/05/24 stable.

## 2024-05-04 NOTE — Progress Notes (Signed)
 Physical Therapy Treatment Patient Details Name: Perry Rosales MRN: [id number] DOB: 21-Apr-1936 Today's Date: 05/04/2024   History of Present Illness 88 y.o. male  who presented from home with complaint of right hip pain with movement after fall at home. Hip x-ray showed right inferior pubic ramus. Case was discussed with orthopedics Dr.Gabeaur who recommended conservative management with pain medication and physical therapy, no indication for operative intervention.  Patient was admitted for pain management and PT evaluation.  recent admission 10/21 for the acute toxic encephalopathy secondary to tramadol  use PMH: L THA posterior approach 09/10/22 , HFpEF, peripheral artery disease status post left external iliac stenting, CKD stage III, hyperlipidemia    PT Comments  PT - Cognition Comments: AxO x 3 pleasant and motivated Gentleman who was IND and taking care of his bedbound spouse when he fell at 2 am.  Pt progressing slowly.  Assisted OOB to amb was limited and required increased time and rest break between each position change.    General bed mobility comments: assist for LEs and to elevate trunk, incr time needed; bed pad used to complete pivot hips and scoot to EOB General transfer comment: required increased time and elevated surface with VC's on proper hand placement to push up vs pull up on walker.  Increased R buttock/groin pain during both sit to stand and stand to sit.  VC's to reach back prior to sit to control desend. General Gait Details: VERY limited amb distance of 7 feet due to increaseed pain and effort to advance R LE.  Heavy lean on walker.  Recliner following for safety.  LPT has rec Pt will need ST Rehab at SNF to address mobility and functional decline prior to safely returning home.    If plan is discharge home, recommend the following: Two people to help with walking and/or transfers;Two people to help with bathing/dressing/bathroom;Help with stairs or ramp for  entrance;Assist for transportation;Assistance with cooking/housework   Can travel by private vehicle     No  Equipment Recommendations  None recommended by PT    Recommendations for Other Services       Precautions / Restrictions Precautions Precautions: Fall Restrictions Weight Bearing Restrictions Per Provider Order: No RLE Weight Bearing Per Provider Order: Weight bearing as tolerated     Mobility  Bed Mobility Overal bed mobility: Needs Assistance Bed Mobility: Supine to Sit     Supine to sit: Min assist, HOB elevated, Used rails     General bed mobility comments: assist for LEs and to elevate trunk, incr time needed; bed pad used to complete pivot hips and scoot to EOB    Transfers Overall transfer level: Needs assistance Equipment used: Rolling walker (2 wheels) Transfers: Sit to/from Stand Sit to Stand: Min assist, From elevated surface           General transfer comment: required increased time and elevated surface with VC's on proper hand placement to push up vs pull up on walker.  Increased R buttock/groin pain during both sit to stand and stand to sit.  VC's to reach back prior to sit to control desend.    Ambulation/Gait Ambulation/Gait assistance: Min assist, +2 safety/equipment Gait Distance (Feet): 7 Feet Assistive device: Rolling walker (2 wheels) Gait Pattern/deviations: Step-to pattern, Decreased stance time - left Gait velocity: decr     General Gait Details: VERY limited amb distance of 7 feet due to increaseed pain and effort to advance R LE.  Heavy lean on walker.  Recliner following for  safety.   Stairs             Wheelchair Mobility     Tilt Bed    Modified Rankin (Stroke Patients Only)       Balance                                            Communication Communication Communication: No apparent difficulties Factors Affecting Communication: Hearing impaired  Cognition Arousal:  Alert Behavior During Therapy: WFL for tasks assessed/performed   PT - Cognitive impairments: No apparent impairments                       PT - Cognition Comments: AxO x 3 pleasant and motivated Gentleman who was IND and taking care of his bedbound spouse when he fell at 2 am. Following commands: Intact      Cueing Cueing Techniques: Verbal cues  Exercises      General Comments        Pertinent Vitals/Pain Pain Assessment Pain Assessment: Faces Faces Pain Scale: Hurts even more Pain Location: R groin and pelvis with activity Pain Descriptors / Indicators: Sore, Guarding, Grimacing Pain Intervention(s): Monitored during session, Premedicated before session, Repositioned    Home Living                          Prior Function            PT Goals (current goals can now be found in the care plan section) Progress towards PT goals: Progressing toward goals    Frequency    Min 4X/week      PT Plan      Co-evaluation              AM-PAC PT 6 Clicks Mobility   Outcome Measure  Help needed turning from your back to your side while in a flat bed without using bedrails?: A Lot Help needed moving from lying on your back to sitting on the side of a flat bed without using bedrails?: A Lot Help needed moving to and from a bed to a chair (including a wheelchair)?: A Lot Help needed standing up from a chair using your arms (e.g., wheelchair or bedside chair)?: A Lot Help needed to walk in hospital room?: A Lot Help needed climbing 3-5 steps with a railing? : Total 6 Click Score: 11    End of Session Equipment Utilized During Treatment: Gait belt Activity Tolerance: Patient limited by pain Patient left: in chair;with call bell/phone within reach;with chair alarm set Nurse Communication: Mobility status PT Visit Diagnosis: Other abnormalities of gait and mobility (R26.89);History of falling (Z91.81);Pain Pain - Right/Left: Right Pain - part  of body: Hip     Time: 9091-9063 PT Time Calculation (min) (ACUTE ONLY): 28 min  Charges:    $Gait Training: 8-22 mins $Therapeutic Activity: 8-22 mins PT General Charges $$ ACUTE PT VISIT: 1 Visit                     Katheryn Leap  PTA Acute  Rehabilitation Services Office M-F          [phone]

## 2024-05-04 NOTE — TOC Progression Note (Signed)
 Transition of Care [facility]) - Progression Note    Patient Details  Name: Perry Rosales MRN: [id number] Date of Birth: Nov 02, 1935  [facility] CM/SW Contact  Tawni CHRISTELLA Eva, LCSW Phone Number: 05/04/2024, 2:47 PM  Clinical Narrative:     CSW met with pt and pt's son to present bed offers. They are requesting time to review. Care management to follow.  Expected Discharge Plan: Home w Home Health Services Barriers to Discharge: Continued Medical Work up               Expected Discharge Plan and Services       Living arrangements for the past 2 months: Single Family Home                           HH Arranged: PT, OT [facility] Agency: [facility] Date [facility] Agency Contacted: 05/03/24 Time HH Agency Contacted: 1506     Social Drivers of Health (SDOH) Interventions SDOH Screenings   Food Insecurity: No Food Insecurity (05/01/2024)  Housing: Low Risk  (05/01/2024)  Transportation Needs: No Transportation Needs (05/01/2024)  Utilities: Not At Risk (05/01/2024)  Social Connections: Moderately Integrated (05/01/2024)  Tobacco Use: Medium Risk (05/01/2024)    Readmission Risk Interventions    04/26/2024   11:17 AM  Readmission Risk Prevention Plan  Transportation Screening Complete  PCP or Specialist Appt within 5-7 Days Complete  Home Care Screening Complete  Medication Review (RN CM) Referral to Pharmacy

## 2024-05-04 NOTE — Progress Notes (Signed)
 OT Cancellation Note  Patient Details Name: Perry Rosales MRN: [id number] DOB: Jul 11, 1935   Cancelled Treatment:    Reason Eval/Treat Not Completed: Other (comment). The nurse tech was present in the room and providing personal care.   Delanna JINNY Lesches, OTR/L 05/04/2024, 5:09 PM

## 2024-05-05 DIAGNOSIS — I5032 Chronic diastolic (congestive) heart failure: Secondary | ICD-10-CM | POA: Diagnosis not present

## 2024-05-05 DIAGNOSIS — K219 Gastro-esophageal reflux disease without esophagitis: Secondary | ICD-10-CM

## 2024-05-05 DIAGNOSIS — I739 Peripheral vascular disease, unspecified: Secondary | ICD-10-CM | POA: Diagnosis not present

## 2024-05-05 DIAGNOSIS — N401 Enlarged prostate with lower urinary tract symptoms: Secondary | ICD-10-CM | POA: Diagnosis not present

## 2024-05-05 DIAGNOSIS — S32591A Other specified fracture of right pubis, initial encounter for closed fracture: Secondary | ICD-10-CM | POA: Diagnosis not present

## 2024-05-05 NOTE — Progress Notes (Signed)
 PROGRESS NOTE    Perry Rosales  FMW:[id number] DOB: 1935-08-12 DOA: 05/01/2024 PCP: Yolande Toribio MATSU, MD  Subjective: Pt seen and examined. He eating his breakfast fruit plate. He states he really likes fruit. Pt has chose [facility] for rehab. CM says there is a bed available today.   Hospital Course: CC: right hip pain after fall HPI: Perry Rosales is a 88 y.o. male with medical history significant of HFpEF, peripheral artery disease status post left external iliac stenting, CKD stage III, hyperlipidemia who presented from home with complaint of right hip pain with movement after he fell last night. Patient was recently discharged on 10/21 after he was admitted for the acute toxic encephalopahty secondary to tramadol  use. As per the report, he was apparently all right till last night.  He lives with his wife and provides total support including ADLs for his wife.  He ambulates very well at his baseline though has remote history of left hip fracture.  He had to wake up last night at 1:00 am to help his wife.  He developed dizziness after he turned suddenly, making him fall on the hardwood floor.  Immediately developed pain in the right hip.  EMS was then called and he was brought to the emergency department. Patient seen and examined at bedside in the emergency department.  During my evaluation, he was hemodynamically stable, mildly hypertensive.  He was alert and oriented.  Complains of mild to moderate right hip pain.  No obvious deformity noted.  He was overall comfortable to my evaluation. No report of fever, chills, chest pain, palpitation, abdominal pain, nausea, vomiting, diarrhea.   ED Course: Hemodynamically stable in the emergency department.  Mildly hypertensive.  Lab work showed creatinine of 1.33 which is at his baseline.  Chest x-ray did not show any acute findings.  Hip x-ray showed right inferior pubic ramus .  Case was discussed with orthopedics Dr.Gabeaur who recommended  conservative management with pain medication and physical therapy, no indication for operative intervention.  Patient was admitted for pain management and PT evaluation.  Significant Events: Admitted 05/01/2024 for right inf pubic ramus fracture   Admission Labs: WBC 7.6, HgB 13.6, plt 197 INR 1.0 Na 137, K 4.4, CO2 of 29, BUN 18, Scr 1.33, glu 177  Admission Imaging Studies: Pelvic XR Offset of the right inferior p ubic ramus is new from 09/10/2022 and indicative of a fracture. CXR No acute findings.   Significant Labs:   Significant Imaging Studies:   Antibiotic Therapy: Anti-infectives (From admission, onward)    None       Procedures:   Consultants:     Assessment and Plan: * Closed fracture of right inferior pubic ramus (HCC) 05/03/24 inferior pubic ramus fracture is treated nonoperatively.  Patient was only able to walk 20 feet with therapy.  Patient told TOC and physical therapy that he is not going to a nursing home.  Wants to be discharged to home.   05/04/24 after pt spoke with his son [name], pt wants to go to SNF now. TOC aware. Pt's son [name] lives with pt and pt's wife in [place] in pt's home. Continue with limited supply of norco. 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  05/05/24 DC to SNF today. [facility].   Chronic heart failure with preserved ejection fraction (HFpEF, >= 50%) (HCC) 05/03/24 chronic. Stable. Euvolemic.  05/04/24 stable. On lasix  20 mg daily.  05/05/24 Continue on Lasix  20 mg  daily.  He is not on any beta-blockers.   PAD (peripheral artery disease) 05/03/24 chronic. F/u with PCP  05/04/24 stable  05/05/24 continue ASA 81 mg daily, Crestor 10 mg daily.   CKD stage 3b, GFR 30-44 ml/min (HCC) - baseline scr 1.3-1.5 05/04/24 baseline scr 1.3-1.5. stable.  05/05/24 stable.  Gastro-esophageal reflux disease without esophagitis 05/05/24 Continue with Protonix  40 mg daily.  Essential  hypertension 05/03/24 stable.  05/04/24 stable  on norvasc  5 mg daily and lasix  20 mg daily.  05/05/24. Continue on norvasc  5 mg daily and lasix  20 mg daily  Benign prostatic hyperplasia with lower urinary tract symptoms 05/05/24 continue Flomax  0.4 mg daily.  DVT prophylaxis: enoxaparin (LOVENOX) injection 40 mg Start: 05/01/24 1700    Code Status: Full Code Family Communication: no family at bedside. Pt is decisional. Disposition Plan: SNF Reason for continuing need for hospitalization: stable for DC to [facility] today.  Objective: Vitals:   05/04/24 0521 05/04/24 1342 05/04/24 2008 05/05/24 0518  BP: (!) 151/72 125/64 129/66 128/70  Pulse: 99 (!) 111 (!) 105 94  Resp: 16 17 14 16   Temp: 98.2 F (36.8 C) 98.8 F (37.1 C) 98.9 F (37.2 C) 97.7 F (36.5 C)  TempSrc: Oral Oral Oral Oral  SpO2: 98% 94% 96% 93%  Weight:      Height:        Intake/Output Summary (Last 24 hours) at 05/05/2024 0950 Last data filed at 05/05/2024 0500 Gross per 24 hour  Intake 600 ml  Output 700 ml  Net -100 ml   Filed Weights   05/01/24 1809  Weight: 74.6 kg    Examination:  Physical Exam Vitals and nursing note reviewed.  Constitutional:      General: He is not in acute distress.    Appearance: He is normal weight. He is not toxic-appearing or diaphoretic.  HENT:     Head: Normocephalic and atraumatic.  Eyes:     General: No scleral icterus. Cardiovascular:     Rate and Rhythm: Normal rate and regular rhythm.  Pulmonary:     Effort: Pulmonary effort is normal.     Breath sounds: Normal breath sounds.  Abdominal:     General: Abdomen is flat. Bowel sounds are normal. There is no distension.     Palpations: Abdomen is soft.  Skin:    General: Skin is warm and dry.     Capillary Refill: Capillary refill takes less than 2 seconds.  Neurological:     Mental Status: He is alert and oriented to person, place, and time.     Data Reviewed: I have personally reviewed  following labs and imaging studies  CBC: Recent Labs  Lab 05/01/24 1256 05/02/24 0422  WBC 7.6 8.8  NEUTROABS 6.6  --   HGB 13.6 13.1  HCT 41.5 38.9*  MCV 89.2 88.4  PLT 197 180   Basic Metabolic Panel: Recent Labs  Lab 05/01/24 1256 05/02/24 0422  NA 137 137  K 4.4 3.6  CL 96* 96*  CO2 29 27  GLUCOSE 177* 141*  BUN 18 18  CREATININE 1.33* 1.26*  CALCIUM 7.8* 7.4*   GFR: Estimated Creatinine Clearance: 44.4 mL/min (A) (by C-G formula based on SCr of 1.26 mg/dL (H)).  Coagulation Profile: Recent Labs  Lab 05/01/24 1256  INR 1.0   Recent Results (from the past 240 hours)  Resp panel by RT-PCR (RSV, Flu A&B, Covid) Anterior Nasal Swab     Status: None   Collection Time:  04/25/24 11:43 AM   Specimen: Anterior Nasal Swab  Result Value Ref Range Status   SARS Coronavirus 2 by RT PCR NEGATIVE NEGATIVE Final    Comment: (NOTE) SARS-CoV-2 target nucleic acids are NOT DETECTED.  The SARS-CoV-2 RNA is generally detectable in upper respiratory specimens during the acute phase of infection. The lowest concentration of SARS-CoV-2 viral copies this assay can detect is 138 copies/mL. A negative result does not preclude SARS-Cov-2 infection and should not be used as the sole basis for treatment or other patient management decisions. A negative result may occur with  improper specimen collection/handling, submission of specimen other than nasopharyngeal swab, presence of viral mutation(s) within the areas targeted by this assay, and inadequate number of viral copies(<138 copies/mL). A negative result must be combined with clinical observations, patient history, and epidemiological information. The expected result is Negative.  Fact Sheet for Patients:  bloggercourse.com  Fact Sheet for Healthcare Providers:  seriousbroker.it  This test is no t yet approved or cleared by the [place]  FDA and  has been authorized for  detection and/or diagnosis of SARS-CoV-2 by FDA under an Emergency Use Authorization (EUA). This EUA will remain  in effect (meaning this test can be used) for the duration of the COVID-19 declaration under Section 564(b)(1) of the Act, 21 U.S.C.section 360bbb-3(b)(1), unless the authorization is terminated  or revoked sooner.       Influenza A by PCR NEGATIVE NEGATIVE Final   Influenza B by PCR NEGATIVE NEGATIVE Final    Comment: (NOTE) The Xpert Xpress SARS-CoV-2/FLU/RSV plus assay is intended as an aid in the diagnosis of influenza from Nasopharyngeal swab specimens and should not be used as a sole basis for treatment. Nasal washings and aspirates are unacceptable for Xpert Xpress SARS-CoV-2/FLU/RSV testing.  Fact Sheet for Patients: bloggercourse.com  Fact Sheet for Healthcare Providers: seriousbroker.it  This test is not yet approved or cleared by the [place]  FDA and has been authorized for detection and/or diagnosis of SARS-CoV-2 by FDA under an Emergency Use Authorization (EUA). This EUA will remain in effect (meaning this test can be used) for the duration of the COVID-19 declaration under Section 564(b)(1) of the Act, 21 U.S.C. section 360bbb-3(b)(1), unless the authorization is terminated or revoked.     Resp Syncytial Virus by PCR NEGATIVE NEGATIVE Final    Comment: (NOTE) Fact Sheet for Patients: bloggercourse.com  Fact Sheet for Healthcare Providers: seriousbroker.it  This test is not yet approved or cleared by the [place]  FDA and has been authorized for detection and/or diagnosis of SARS-CoV-2 by FDA under an Emergency Use Authorization (EUA). This EUA will remain in effect (meaning this test can be used) for the duration of the COVID-19 declaration under Section 564(b)(1) of the Act, 21 U.S.C. section 360bbb-3(b)(1), unless the authorization is  terminated or revoked.  Performed at [facility], [address] [place], [place] [address]    Scheduled Meds:  amitriptyline   50 mg Oral QHS   amLODipine   5 mg Oral Daily   aspirin  EC  81 mg Oral Daily   enoxaparin (LOVENOX) injection  40 mg Subcutaneous Q24H   furosemide   20 mg Oral Daily   gabapentin   100 mg Oral BID   pantoprazole   40 mg Oral Daily   rosuvastatin  10 mg Oral Daily   senna-docusate  1 tablet Oral BID   tamsulosin   0.4 mg Oral QPC supper   Continuous Infusions:   LOS: 3 days   Time spent: 55 minutes  Camellia Door, DO  [facility]  05/05/2024, 9:50 AM

## 2024-05-05 NOTE — Progress Notes (Signed)
 Attempted to call report to [facility] x3 per number given by CM.

## 2024-05-05 NOTE — Assessment & Plan Note (Signed)
 05/05/24 continue Flomax  0.4 mg daily.

## 2024-05-05 NOTE — Discharge Summary (Signed)
 [facility] Physician Discharge Summary   Patient name: Perry Rosales  Admit date:     05/01/2024  Discharge date: 05/05/2024  Attending Physician: Perry Rosales [[id number]]  Discharge Physician: Perry Rosales   PCP: Perry Toribio MATSU, MD  Admitted From: Home  Disposition:  Perry Rosales  SNF  Recommendations for Outpatient Follow-up:  Follow up with PCP in 1-2 weeks  Home Health:No Equipment/Devices: None    Discharge Condition:Stable CODE STATUS:FULL Diet recommendation: Heart Healthy Fluid Restriction: None  Hospital Summary: CC: right hip pain after fall HPI: Perry Rosales is a 88 y.o. male with medical history significant of HFpEF, peripheral artery disease status post left external iliac stenting, CKD stage III, hyperlipidemia who presented from home with complaint of right hip pain with movement after he fell last night. Patient was recently discharged on 10/21 after he was admitted for the acute toxic encephalopahty secondary to tramadol  use. As per the report, he was apparently all right till last night.  He lives with his wife and provides total support including ADLs for his wife.  He ambulates very well at his baseline though has remote history of left hip fracture.  He had to wake up last night at 1:00 am to help his wife.  He developed dizziness after he turned suddenly, making him fall on the hardwood floor.  Immediately developed pain in the right hip.  EMS was then called and he was brought to the emergency department. Patient seen and examined at bedside in the emergency department.  During my evaluation, he was hemodynamically stable, mildly hypertensive.  He was alert and oriented.  Complains of mild to moderate right hip pain.  No obvious deformity noted.  He was overall comfortable to my evaluation. No report of fever, chills, chest pain, palpitation, abdominal pain, nausea, vomiting, diarrhea.   ED Course: Hemodynamically stable in the emergency  department.  Mildly hypertensive.  Lab work showed creatinine of 1.33 which is at his baseline.  Chest x-ray did not show any acute findings.  Hip x-ray showed right inferior pubic ramus .  Case was discussed with orthopedics Dr.Gabeaur who recommended conservative management with pain medication and physical therapy, no indication for operative intervention.  Patient was admitted for pain management and PT evaluation.  Significant Events: Admitted 05/01/2024 for right inf pubic ramus fracture   Admission Labs: WBC 7.6, HgB 13.6, plt 197 INR 1.0 Na 137, K 4.4, CO2 of 29, BUN 18, Scr 1.33, glu 177  Admission Imaging Studies: Pelvic XR Offset of the right inferior p ubic ramus is new from 09/10/2022 and indicative of a fracture. CXR No acute findings.   Significant Labs:   Significant Imaging Studies:   Antibiotic Therapy: Anti-infectives (From admission, onward)    None       Procedures:   Consultants:    Hospital Course by Problem: * Closed fracture of right inferior pubic ramus (HCC) 05/03/24 inferior pubic ramus fracture is treated nonoperatively.  Patient was only able to walk 20 feet with therapy.  Patient told TOC and physical therapy that he is not going to a nursing home.  Wants to be discharged to home.   05/04/24 after pt spoke with his son Perry Rosales, pt wants to go to SNF now. TOC aware. Pt's son Perry Rosales lives with pt and pt's wife in [place] in pt's home. Continue with limited supply of norco. 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  05/05/24 DC to SNF today. [facility].  Chronic heart failure with preserved ejection fraction (HFpEF, >= 50%) (HCC) 05/03/24 chronic. Stable. Euvolemic.  05/04/24 stable. On lasix  20 mg daily.  05/05/24 Continue on Lasix  20 mg daily.  He is not on any beta-blockers.   PAD (peripheral artery disease) 05/03/24 chronic. F/u with PCP  05/04/24 stable  05/05/24 continue ASA 81 mg daily, Crestor 10 mg  daily.   CKD stage 3b, GFR 30-44 ml/min (HCC) - baseline scr 1.3-1.5 05/04/24 baseline scr 1.3-1.5. stable.  05/05/24 stable.  Gastro-esophageal reflux disease without esophagitis 05/05/24 Continue with Protonix  40 mg daily.  Essential hypertension 05/03/24 stable.  05/04/24 stable  on norvasc  5 mg daily and lasix  20 mg daily.  05/05/24. Continue on norvasc  5 mg daily and lasix  20 mg daily  Benign prostatic hyperplasia with lower urinary tract symptoms 05/05/24 continue Flomax  0.4 mg daily.    Discharge Diagnoses:  Principal Problem:   Closed fracture of right inferior pubic ramus (HCC) Active Problems:   PAD (peripheral artery disease)   Chronic heart failure with preserved ejection fraction (HFpEF, >= 50%) (HCC)   Benign prostatic hyperplasia with lower urinary tract symptoms   Essential hypertension   Gastro-esophageal reflux disease without esophagitis   CKD stage 3b, GFR 30-44 ml/min (HCC) - baseline scr 1.3-1.5   Discharge Instructions  Discharge Instructions     Call MD for:  difficulty breathing, headache or visual disturbances   Complete by: As directed    Call MD for:  extreme fatigue   Complete by: As directed    Call MD for:  hives   Complete by: As directed    Call MD for:  persistant dizziness or light-headedness   Complete by: As directed    Call MD for:  persistant nausea and vomiting   Complete by: As directed    Call MD for:  redness, tenderness, or signs of infection (pain, swelling, redness, odor or green/yellow discharge around incision site)   Complete by: As directed    Call MD for:  severe uncontrolled pain   Complete by: As directed    Call MD for:  temperature >100.4   Complete by: As directed    Diet - low sodium heart healthy   Complete by: As directed    Discharge instructions   Complete by: As directed    1. Follow up with your primary care provider in 1-2 weeks following discharge from hospital. 2. Do Not use tramadol  and  hydrocodone together. Hold off on using tramadol  until you are finished taking hydrocodone.   Increase activity slowly   Complete by: As directed       Allergies as of 05/05/2024   No Known Allergies      Medication List     PAUSE taking these medications    traMADol  50 MG tablet Wait to take this until your doctor or other care provider tells you to start again. Do not use tramadol  and hydrocodone together. Wait until you are finished using hydrocodone before restarting tramadol   Commonly known as: ULTRAM  Take 1 tablet (50 mg total) by mouth every 12 (twelve) hours as needed.       STOP taking these medications    gabapentin  100 MG capsule Commonly known as: NEURONTIN        TAKE these medications    amitriptyline  50 MG tablet Commonly known as: ELAVIL  Take 1 tablet (50 mg total) by mouth at bedtime.   aspirin  EC 81 MG tablet Take 81 mg by mouth daily. Swallow whole.  augmented betamethasone dipropionate 0.05 % cream Commonly known as: DIPROLENE-AF Apply 1 application  topically 2 (two) times daily as needed (for itching or inflammation).   calcium carbonate 500 MG chewable tablet Commonly known as: TUMS - dosed in mg elemental calcium Chew 2-3 tablets by mouth daily as needed for indigestion or heartburn.   calcium-vitamin D 500-5 MG-MCG tablet Commonly known as: OSCAL WITH D Take 2 tablets by mouth 2 (two) times daily.   furosemide  20 MG tablet Commonly known as: LASIX  Take 1 tablet (20 mg total) by mouth daily as needed (for weight gain of 3 lbs in 1 day or 5 lbs in 1 week). What changed: when to take this   HYDROcodone-acetaminophen  5-325 MG tablet Commonly known as: NORCO/VICODIN Take 1-2 tablets by mouth every 6 (six) hours as needed for up to 4 days for moderate pain (pain score 4-6).   omeprazole  20 MG capsule Commonly known as: PRILOSEC Take 1 capsule (20 mg total) by mouth daily. What changed: when to take this   polyethylene glycol 17 g  packet Commonly known as: MIRALAX  / GLYCOLAX  Take 17 g by mouth daily.   potassium chloride  10 MEQ tablet Commonly known as: KLOR-CON  TAKE 1 TABLET EVERY DAY What changed: when to take this   rosuvastatin 10 MG tablet Commonly known as: CRESTOR Take 10 mg by mouth daily.   tamsulosin  0.4 MG Caps capsule Commonly known as: FLOMAX  Take 1 capsule (0.4 mg total) by mouth in the morning and at bedtime.   triamcinolone cream 0.1 % Commonly known as: KENALOG Apply 1 Application topically 2 (two) times daily as needed (for itching or irritation).        No Known Allergies  Discharge Exam: Vitals:   05/04/24 2008 05/05/24 0518  BP: 129/66 128/70  Pulse: (!) 105 94  Resp: 14 16  Temp: 98.9 F (37.2 C) 97.7 F (36.5 C)  SpO2: 96% 93%    Physical Exam Vitals and nursing note reviewed.  Constitutional:      General: He is not in acute distress.    Appearance: He is normal weight. He is not toxic-appearing or diaphoretic.  HENT:     Head: Normocephalic and atraumatic.  Eyes:     General: No scleral icterus. Cardiovascular:     Rate and Rhythm: Normal rate and regular rhythm.  Pulmonary:     Effort: Pulmonary effort is normal.     Breath sounds: Normal breath sounds.  Abdominal:     General: Abdomen is flat. Bowel sounds are normal. There is no distension.     Palpations: Abdomen is soft.  Skin:    General: Skin is warm and dry.     Capillary Refill: Capillary refill takes less than 2 seconds.  Neurological:     Mental Status: He is alert and oriented to person, place, and time.     The results of significant diagnostics from this hospitalization (including imaging, microbiology, ancillary and laboratory) are listed below for reference.    Microbiology: Recent Results (from the past 240 hours)  Resp panel by RT-PCR (RSV, Flu A&B, Covid) Anterior Nasal Swab     Status: None   Collection Time: 04/25/24 11:43 AM   Specimen: Anterior Nasal Swab  Result Value Ref  Range Status   SARS Coronavirus 2 by RT PCR NEGATIVE NEGATIVE Final    Comment: (NOTE) SARS-CoV-2 target nucleic acids are NOT DETECTED.  The SARS-CoV-2 RNA is generally detectable in upper respiratory specimens during the acute phase of  infection. The lowest concentration of SARS-CoV-2 viral copies this assay can detect is 138 copies/mL. A negative result does not preclude SARS-Cov-2 infection and should not be used as the sole basis for treatment or other patient management decisions. A negative result may occur with  improper specimen collection/handling, submission of specimen other than nasopharyngeal swab, presence of viral mutation(s) within the areas targeted by this assay, and inadequate number of viral copies(<138 copies/mL). A negative result must be combined with clinical observations, patient history, and epidemiological information. The expected result is Negative.  Fact Sheet for Patients:  bloggercourse.com  Fact Sheet for Healthcare Providers:  seriousbroker.it  This test is no t yet approved or cleared by the [place]  FDA and  has been authorized for detection and/or diagnosis of SARS-CoV-2 by FDA under an Emergency Use Authorization (EUA). This EUA will remain  in effect (meaning this test can be used) for the duration of the COVID-19 declaration under Section 564(b)(1) of the Act, 21 U.S.C.section 360bbb-3(b)(1), unless the authorization is terminated  or revoked sooner.       Influenza A by PCR NEGATIVE NEGATIVE Final   Influenza B by PCR NEGATIVE NEGATIVE Final    Comment: (NOTE) The Xpert Xpress SARS-CoV-2/FLU/RSV plus assay is intended as an aid in the diagnosis of influenza from Nasopharyngeal swab specimens and should not be used as a sole basis for treatment. Nasal washings and aspirates are unacceptable for Xpert Xpress SARS-CoV-2/FLU/RSV testing.  Fact Sheet for  Patients: bloggercourse.com  Fact Sheet for Healthcare Providers: seriousbroker.it  This test is not yet approved or cleared by the [place]  FDA and has been authorized for detection and/or diagnosis of SARS-CoV-2 by FDA under an Emergency Use Authorization (EUA). This EUA will remain in effect (meaning this test can be used) for the duration of the COVID-19 declaration under Section 564(b)(1) of the Act, 21 U.S.C. section 360bbb-3(b)(1), unless the authorization is terminated or revoked.     Resp Syncytial Virus by PCR NEGATIVE NEGATIVE Final    Comment: (NOTE) Fact Sheet for Patients: bloggercourse.com  Fact Sheet for Healthcare Providers: seriousbroker.it  This test is not yet approved or cleared by the [place]  FDA and has been authorized for detection and/or diagnosis of SARS-CoV-2 by FDA under an Emergency Use Authorization (EUA). This EUA will remain in effect (meaning this test can be used) for the duration of the COVID-19 declaration under Section 564(b)(1) of the Act, 21 U.S.C. section 360bbb-3(b)(1), unless the authorization is terminated or revoked.  Performed at [facility], [address] [place], [place] [address]      Labs:  Basic Metabolic Panel: Recent Labs  Lab 05/01/24 1256 05/02/24 0422  NA 137 137  K 4.4 3.6  CL 96* 96*  CO2 29 27  GLUCOSE 177* 141*  BUN 18 18  CREATININE 1.33* 1.26*  CALCIUM 7.8* 7.4*   CBC: Recent Labs  Lab 05/01/24 1256 05/02/24 0422  WBC 7.6 8.8  NEUTROABS 6.6  --   HGB 13.6 13.1  HCT 41.5 38.9*  MCV 89.2 88.4  PLT 197 180   Urinalysis    Component Value Date/Time   COLORURINE YELLOW 04/25/2024 1309   APPEARANCEUR CLEAR 04/25/2024 1309   LABSPEC 1.028 04/25/2024 1309   PHURINE 6.0 04/25/2024 1309   GLUCOSEU NEGATIVE 04/25/2024 1309   HGBUR NEGATIVE 04/25/2024 1309    BILIRUBINUR NEGATIVE 04/25/2024 1309   KETONESUR NEGATIVE 04/25/2024 1309   PROTEINUR 30 (A) 04/25/2024 1309   NITRITE NEGATIVE 04/25/2024 1309  LEUKOCYTESUR NEGATIVE 04/25/2024 1309   Sepsis Labs Recent Labs  Lab 05/01/24 1256 05/02/24 0422  WBC 7.6 8.8    Procedures/Studies: DG Chest 1 View Result Date: 05/01/2024 CLINICAL DATA:  Fall. EXAM: CHEST  1 VIEW COMPARISON:  04/17/2024 and CT chest 04/17/2024. FINDINGS: Trachea is midline. Heart size within normal limits. Lungs are clear. No pleural fluid. IMPRESSION: No acute findings. Electronically Signed   By: Newell Eke M.D.   On: 05/01/2024 14:18   DG Hip Unilat W or Wo Pelvis 2-3 Views Right Result Date: 05/01/2024 CLINICAL DATA:  Hip pain after a tripping injury and fall. EXAM: DG HIP (WITH OR WITHOUT PELVIS) 2-3V RIGHT COMPARISON:  09/10/2022. FINDINGS: There is offset of the right inferior pubic ramus. Right femur is intact. No dislocation. Left hip arthroplasty. IMPRESSION: Offset of the right inferior pubic ramus is new from 09/10/2022 and indicative of a fracture. Electronically Signed   By: Newell Eke M.D.   On: 05/01/2024 14:17   ECHOCARDIOGRAM COMPLETE Result Date: 04/26/2024    ECHOCARDIOGRAM REPORT   Patient Name:   ELDIN BONSELL Date of Exam: 04/26/2024 Medical Rec #:  [id number]      Height:       71.0 in Accession #:    [id number]     Weight:       172.8 lb Date of Birth:  18-Jun-1936      BSA:          1.982 m Patient Age:    88 years       BP:           136/59 mmHg Patient Gender: M              HR:           69 bpm. Exam Location:  Inpatient Procedure: 2D Echo, Cardiac Doppler and Color Doppler (Both Spectral and Color            Flow Doppler were utilized during procedure). Indications:    R07.9* Chest pain, unspecified. Elevated troponin  History:        Patient has prior history of Echocardiogram examinations, most                 recent 10/30/2022. CHF, Signs/Symptoms:Edema; Risk                  Factors:Hypertension and Former Smoker.  Sonographer:    Ellouise Mose RDCS Referring Phys: [id number] ZANE ADAMS IMPRESSIONS  1. Left ventricular ejection fraction, by estimation, is 55 to 60%. Left ventricular ejection fraction by 2D MOD biplane is 57.6 %. The left ventricle has normal function. The left ventricle demonstrates regional wall motion abnormalities (see scoring diagram/findings for description). Left ventricular diastolic parameters are consistent with Grade I diastolic dysfunction (impaired relaxation).  2. Right ventricular systolic function is normal. The right ventricular size is normal. Tricuspid regurgitation signal is inadequate for assessing PA pressure.  3. The mitral valve is grossly normal. Trivial mitral valve regurgitation.  4. The aortic valve is tricuspid. Aortic valve regurgitation is not visualized. Aortic valve sclerosis/calcification is present, without any evidence of aortic stenosis. Aortic valve mean gradient measures 8.6 mmHg.  5. The inferior vena cava is normal in size with greater than 50% respiratory variability, suggesting right atrial pressure of 3 mmHg. Comparison(s): Changes from prior study are noted. 10/30/2022: LVEF 55%, severe basal inferior hypokinesis. FINDINGS  Left Ventricle: Left ventricular ejection fraction, by estimation, is 55 to 60%. Left ventricular ejection fraction  by 2D MOD biplane is 57.6 %. The left ventricle has normal function. The left ventricle demonstrates regional wall motion abnormalities. Severe akinesis of the left ventricular, basal inferior wall. The left ventricular internal cavity size was normal in size. There is no left ventricular hypertrophy. Left ventricular diastolic parameters are consistent with Grade I diastolic dysfunction (impaired relaxation). Indeterminate filling pressures.  LV Wall Scoring: The basal inferolateral segment is akinetic. Right Ventricle: The right ventricular size is normal. No increase in right ventricular wall  thickness. Right ventricular systolic function is normal. Tricuspid regurgitation signal is inadequate for assessing PA pressure. Left Atrium: Left atrial size was normal in size. Right Atrium: Right atrial size was normal in size. Pericardium: There is no evidence of pericardial effusion. Mitral Valve: The mitral valve is grossly normal. Trivial mitral valve regurgitation. Tricuspid Valve: The tricuspid valve is grossly normal. Tricuspid valve regurgitation is trivial. Aortic Valve: The aortic valve is tricuspid. Aortic valve regurgitation is not visualized. Aortic valve sclerosis/calcification is present, without any evidence of aortic stenosis. Aortic valve mean gradient measures 8.6 mmHg. Aortic valve peak gradient measures 16.0 mmHg. Aortic valve area, by VTI measures 2.05 cm. Pulmonic Valve: The pulmonic valve was normal in structure. Pulmonic valve regurgitation is not visualized. Aorta: The aortic root and ascending aorta are structurally normal, with no evidence of dilitation. Venous: The inferior vena cava is normal in size with greater than 50% respiratory variability, suggesting right atrial pressure of 3 mmHg. IAS/Shunts: No atrial level shunt detected by color flow Doppler.  LEFT VENTRICLE PLAX 2D                        Biplane EF (MOD) LVIDd:         4.50 cm         LV Biplane EF:   Left LVIDs:         2.80 cm                          ventricular LV PW:         1.00 cm                          ejection LV IVS:        1.10 cm                          fraction by LVOT diam:     2.30 cm                          2D MOD LV SV:         88                               biplane is LV SV Index:   45                               57.6 %. LVOT Area:     4.15 cm                                Diastology  LV e' medial:    6.09 cm/s LV Volumes (MOD)               LV E/e' medial:  7.6 LV vol d, MOD    89.9 ml       LV e' lateral:   8.27 cm/s A2C:                           LV E/e'  lateral: 5.6 LV vol d, MOD    76.2 ml A4C: LV vol s, MOD    38.3 ml A2C: LV vol s, MOD    32.1 ml A4C: LV SV MOD A2C:   51.6 ml LV SV MOD A4C:   76.2 ml LV SV MOD BP:    48.2 ml RIGHT VENTRICLE             IVC RV S prime:     14.50 cm/s  IVC diam: 1.90 cm TAPSE (M-mode): 1.6 cm                             PULMONARY VEINS                             Diastolic Velocity: 45.60 cm/s                             S/D Velocity:       1.00                             Systolic Velocity:  44.70 cm/s LEFT ATRIUM             Index        RIGHT ATRIUM           Index LA diam:        2.70 cm 1.36 cm/m   RA Area:     10.40 cm LA Vol (A2C):   23.3 ml 11.76 ml/m  RA Volume:   18.80 ml  9.49 ml/m LA Vol (A4C):   26.0 ml 13.12 ml/m LA Biplane Vol: 24.8 ml 12.51 ml/m  AORTIC VALVE AV Area (Vmax):    2.02 cm AV Area (Vmean):   2.05 cm AV Area (VTI):     2.05 cm AV Vmax:           200.20 cm/s AV Vmean:          134.200 cm/s AV VTI:            0.431 m AV Peak Grad:      16.0 mmHg AV Mean Grad:      8.6 mmHg LVOT Vmax:         97.20 cm/s LVOT Vmean:        66.100 cm/s LVOT VTI:          0.213 m LVOT/AV VTI ratio: 0.49  AORTA Ao Root diam: 3.30 cm Ao Asc diam:  3.30 cm MITRAL VALVE MV Area (PHT): 2.84 cm    SHUNTS MV Decel Time: 268 msec    Systemic VTI:  0.21 m MV E velocity: 46.03 cm/s  Systemic Diam: 2.30 cm MV A velocity: 75.20 cm/s MV E/A ratio:  0.61 Vinie Maxcy MD Electronically signed by Vinie Maxcy MD Signature Date/Time: 04/26/2024/4:46:55 PM    Final  CT Angio Chest PE W and/or Wo Contrast Result Date: 04/25/2024 CLINICAL DATA:  Concern for pulmonary embolism. EXAM: CT ANGIOGRAPHY CHEST WITH CONTRAST TECHNIQUE: Multidetector CT imaging of the chest was performed using the standard protocol during bolus administration of intravenous contrast. Multiplanar CT image reconstructions and MIPs were obtained to evaluate the vascular anatomy. RADIATION DOSE REDUCTION: This exam was performed according to the  departmental dose-optimization program which includes automated exposure control, adjustment of the mA and/or kV according to patient size and/or use of iterative reconstruction technique. CONTRAST:  50mL OMNIPAQUE IOHEXOL 350 MG/ML SOLN COMPARISON:  Chest radiograph dated 04/25/2024 and CT dated 12/05/2021. FINDINGS: Cardiovascular: There is no cardiomegaly or pericardial effusion. Three-vessel coronary vascular calcification. Moderate calcified and noncalcified plaque of the thoracic aorta. No abnormal dilatation or dissection. No pulmonary artery embolus identified. Mediastinum/Nodes: No hilar or mediastinal adenopathy. The esophagus and thyroid gland are grossly unremarkable. No mediastinal fluid collection. Lungs/Pleura: No focal consolidation, pleural effusion, pneumothorax. The central airways are patent. Upper Abdomen: Cholecystectomy. Musculoskeletal: No acute osseous pathology. T11 compression fracture as seen on the prior CT. Review of the MIP images confirms the above findings. IMPRESSION: 1. No acute intrathoracic pathology. No CT evidence of pulmonary artery embolus. 2. Three-vessel coronary vascular calcification. 3.  Aortic Atherosclerosis (ICD10-I70.0). Electronically Signed   By: Vanetta Chou M.D.   On: 04/25/2024 14:29   CT Head Wo Contrast Result Date: 04/25/2024 EXAM: CT HEAD WITHOUT CONTRAST 04/25/2024 12:03:46 PM TECHNIQUE: CT of the head was performed without the administration of intravenous contrast. Automated exposure control, iterative reconstruction, and/or weight based adjustment of the mA/kV was utilized to reduce the radiation dose to as low as reasonably achievable. COMPARISON: None available. CLINICAL HISTORY: Mental status change, unknown cause. Triage note: Reports confusion and disorient starting at 2145 last night. Family states patient has been taking tramadol  for a pinched nerve in back. Family concerned he might have taken too much Family states patient normally is  A\T\Ox4. Unable to answer questions or follow commands in triage. FINDINGS: BRAIN AND VENTRICLES: No acute hemorrhage. No evidence of acute infarct. No hydrocephalus. No extra-axial collection. No mass effect or midline shift. Age-related atrophy. Mild periventricular and deep white matter hypodensity typical of chronic small vessel ischemia. Atherosclerosis of skullbase vasculature without hyperdense vessel or abnormal calcification. ORBITS: Bilateral cataract resection. SINUSES: Mucosal thickening throughout ethmoid air cells. SOFT TISSUES AND SKULL: No acute soft tissue abnormality. No skull fracture. IMPRESSION: 1. No acute intracranial abnormality. 2. Age-related atrophy and mild periventricular and deep white matter hypodensity typical of chronic small vessel ischemia. Electronically signed by: Evalene Coho MD 04/25/2024 12:30 PM EDT RP Workstation: [id number]   DG Chest Portable 1 View Result Date: 04/25/2024 CLINICAL DATA:  Possible aspiration.  Hypoxia. EXAM: PORTABLE CHEST 1 VIEW COMPARISON:  09/08/2022 FINDINGS: Lungs are hypoinflated without acute airspace consolidation or effusion. Cardiomediastinal silhouette and remainder of the exam is unchanged. IMPRESSION: Hypoinflation without acute cardiopulmonary disease. Electronically Signed   By: Toribio Agreste M.D.   On: 04/25/2024 12:15    Time coordinating discharge: 60 mins  SIGNED:  Camellia Door, DO [facility] 05/05/24, 9:52 AM

## 2024-05-05 NOTE — Assessment & Plan Note (Signed)
 05/05/24 Continue with Protonix  40 mg daily.

## 2024-05-05 NOTE — TOC Transition Note (Signed)
 Transition of Care [facility]) - Discharge Note   Patient Details  Name: Perry Rosales MRN: [id number] Date of Birth: [birth year]  Transition of Care [facility] CM/SW Contact:  Tawni CHRISTELLA Eva, LCSW Phone Number: 05/05/2024, 10:07 AM   Clinical Narrative:     CSW spoke with pt's son and he has chosen [facility]. Pt to d/c to [facility], RN to call report to [phone]. Spoke with son who agrees with plan. Care management sign off.   Final next level of care: Skilled Nursing Facility Barriers to Discharge: Barriers Resolved   Patient Goals and CMS Choice Patient states their goals for this hospitalization and ongoing recovery are:: SNF to get stronger CMS Medicare.gov Compare Post Acute Care list provided to:: Patient Choice offered to / list presented to : Patient      Discharge Placement              Patient chooses bed at: [facility] Patient to be transferred to facility by: EMS   Patient and family notified of of transfer: 05/05/24  Discharge Plan and Services Additional resources added to the After Visit Summary for                            [facility] Arranged: PT, OT [facility] Agency: [facility] Date [facility] Agency Contacted: 05/03/24 Time HH Agency Contacted: [phone]    Social Drivers of Health (SDOH) Interventions SDOH Screenings   Food Insecurity: No Food Insecurity (05/01/2024)  Housing: Low Risk  (05/01/2024)  Transportation Needs: No Transportation Needs (05/01/2024)  Utilities: Not At Risk (05/01/2024)  Social Connections: Moderately Integrated (05/01/2024)  Tobacco Use: Medium Risk (05/01/2024)     Readmission Risk Interventions    04/26/2024   11:17 AM  Readmission Risk Prevention Plan  Transportation Screening Complete  PCP or Specialist Appt within 5-7 Days Complete  Home Care Screening Complete  Medication Review (RN CM) Referral to Pharmacy

## 2024-05-05 NOTE — Plan of Care (Signed)

## 2024-05-31 ENCOUNTER — Ambulatory Visit (HOSPITAL_COMMUNITY)
Admission: RE | Admit: 2024-05-31 | Discharge: 2024-05-31 | Disposition: A | Source: Ambulatory Visit | Attending: Cardiovascular Disease | Admitting: Cardiovascular Disease

## 2024-05-31 ENCOUNTER — Ambulatory Visit (HOSPITAL_COMMUNITY)
Admission: RE | Admit: 2024-05-31 | Discharge: 2024-05-31 | Attending: Cardiovascular Disease | Admitting: Cardiovascular Disease

## 2024-05-31 DIAGNOSIS — Z95820 Peripheral vascular angioplasty status with implants and grafts: Secondary | ICD-10-CM | POA: Diagnosis present

## 2024-05-31 DIAGNOSIS — I739 Peripheral vascular disease, unspecified: Secondary | ICD-10-CM | POA: Diagnosis present

## 2024-06-01 LAB — VAS US ABI WITH/WO TBI
Left ABI: 0.6
Right ABI: 0.6

## 2024-06-02 ENCOUNTER — Ambulatory Visit: Payer: Self-pay | Admitting: Cardiovascular Disease

## 2024-06-08 ENCOUNTER — Encounter: Payer: Self-pay | Admitting: *Deleted

## 2024-06-09 ENCOUNTER — Encounter: Payer: Self-pay | Admitting: *Deleted

## 2024-06-12 ENCOUNTER — Encounter: Payer: Self-pay | Admitting: Emergency Medicine

## 2024-06-12 ENCOUNTER — Ambulatory Visit: Admitting: Emergency Medicine

## 2024-06-12 VITALS — BP 112/64 | HR 92 | Ht 71.5 in | Wt 170.0 lb

## 2024-06-12 DIAGNOSIS — I5022 Chronic systolic (congestive) heart failure: Secondary | ICD-10-CM | POA: Insufficient documentation

## 2024-06-12 DIAGNOSIS — I739 Peripheral vascular disease, unspecified: Secondary | ICD-10-CM | POA: Diagnosis present

## 2024-06-12 DIAGNOSIS — I2510 Atherosclerotic heart disease of native coronary artery without angina pectoris: Secondary | ICD-10-CM | POA: Diagnosis present

## 2024-06-12 DIAGNOSIS — E782 Mixed hyperlipidemia: Secondary | ICD-10-CM | POA: Diagnosis not present

## 2024-06-12 DIAGNOSIS — I10 Essential (primary) hypertension: Secondary | ICD-10-CM | POA: Insufficient documentation

## 2024-06-12 NOTE — Patient Instructions (Signed)
 Medication Instructions:  NO CHANGES  Lab Work: NONE TO BE DONE TODAY.  Testing/Procedures: NONE  Follow-Up: At [facility], you and your health needs are our priority.  As part of our continuing mission to provide you with exceptional heart care, our providers are all part of one team.  This team includes your primary Cardiologist (physician) and Advanced Practice Providers or APPs (Physician Assistants and Nurse Practitioners) who all work together to provide you with the care you need, when you need it.  Your next appointment:   6 MONTHS  Provider:   Darryle ONEIDA Decent, MD

## 2024-06-12 NOTE — Progress Notes (Signed)
 Cardiology Office Note:    Date:  06/12/2024  ID:  Perry Rosales, DOB Jul 14, 1935, MRN [id number] PCP: Yolande Toribio MATSU, MD  [facility] Providers Cardiologist:  Darryle ONEIDA Decent, MD Cardiology APP:  Rana Lum CROME, NP  [facility] Cardiologist:  Deatrice Cage, MD       Patient Profile:       Chief Complaint: Hospital follow-up History of Present Illness:  Perry Rosales is a 88 y.o. male with visit-pertinent history of PAD, systolic heart failure with recovered ejection fraction, hypertension, hyperlipidemia, CKD stage IIIa   He was seen in the hospital for abnormal echocardiogram in the setting of fall and left hip fracture.  Echocardiogram obtained on 09/08/2022 showed EF 40-45%, regional wall abnormality in the LAD territory concerning for stress cardiomyopathy, grade 1 DD, moderately elevated PA systolic pressure.  He had no chest pain therefore he proceeded with orthopedic surgery.  He underwent diuresis and GDMT titration.  Repeat echocardiogram ordered and completed on 10/30/2022 showing EF improved to 55%, basal inferior severe hypokinesis, grade 1 DD.  The previous wall motion abnormality in the septum and apex has resolved.     He also has moderate right lower extremity disease based on ABIs.  He was evaluated by Dr. Cage on 10/27/2022 who recommended proceeding with abdominal aortogram with lower extremity angiography.  Patient underwent procedure on 11/04/2022 which revealed severe stenosis at the junction of the distal left common iliac artery and ostial left external iliac artery, flush occlusion of the left SFA with reconstitution via well-developed collaterals from the profunda and two-vessel runoff below the knee via posterior and peroneal arteries, moderate right external iliac artery stenosis.  He underwent successful angioplasty and self-expanding stent placement to the ostial left external iliac artery extending into the distal common iliac artery.  Postprocedure patient  was placed on aspirin  and Plavix  with recommendation to continue DAPT for 3 months.  ABI obtained 11/25/2022 showed right ABI 0.56, left ABI 0.52.  Vas ultrasound showed patent left iliac stent.  Most recent Doppler study 05/2023 showed ABI of 0.71 on the right and 0.65 on the left.  Duplex showed patent left iliac stent with no significant restenosis.   He was seen by Dr. Vernice on 05/19/2023.  Patient was noted to be hypotensive with blood pressure 92/60.  His metoprolol  was stopped he was to monitor blood pressure at home and follow-up in 3 months.   Seen on 08/17/2023 for follow-up.  He is doing well without acute complaints.  No further hypotensive episodes.  He was to follow-up in 6 months.  Last seen in clinic on 03/02/2024.  Reported felt the best he has in a very long time.  Blood pressure is well-controlled.  No medication changes were made.  He was admitted to the hospital on 03/2024 with encephalopathy 2/2 tramadol  use.  Mentation improved after receiving IV Narcan .  Cardiology was consulted due to abnormal troponins which were minimally elevated and flat.  He did have a slight AKI on admission which was improving with fluids.  His EKG was nonischemic and he was without any chest pains.  Overall this is likely demand in the setting of tramadol  overuse.  He underwent echocardiogram on 04/26/2024 showing LVEF 55 to 60%, severe akinesis of LV basal inferior wall, no LVH, grade 1 DD, RV function and size normal, trivial MR   He was admitted on 04/2024 with fall and sustaining closed fracture of right inferior pubic ramus.  This was treated conservatively.  Discussed the use of AI scribe software for clinical note transcription with the patient, who gave verbal consent to proceed.  History of Present Illness Perry Rosales is an 88 year old male who presents for follow-up after hospitalization for encephalopathy and hip fracture.   Today patient presents to office with his son.  He is without  acute cardiovascular concerns or complaints today.  Since the hospitalizations he has had no chest pain, dyspnea, or significant weight gain. He sleeps in a recliner due to hip discomfort but expects to return to bed as the hip improves.  He currently takes Xanax 1 mg at night, which improves sleep and anxiety. He stopped regular tramadol  and hydrocodone  but occasionally takes 30 mg of tramadol  at night.   The patient tells me today that his PCP stopped both furosemide  and potassium due to potential drug interactions, and he has noticed some leg swelling since they were discontinued.  He denies orthopnea, PND, syncope, presyncope, lightheadedness, dizziness, melena, hematochezia   Review of systems:  Please see the history of present illness. All other systems are reviewed and otherwise negative.      Studies Reviewed:    EKG Interpretation Date/Time:  Monday June 12 2024 10:05:15 EST Ventricular Rate:  92 PR Interval:  98 QRS Duration:  98 QT Interval:  394 QTC Calculation: 487 R Axis:   41  Text Interpretation: Sinus rhythm with short PR with occasional Premature ventricular complexes Prolonged QT When compared with ECG of 01-May-2024 12:24, PREVIOUS ECG IS PRESENT Confirmed by Rana Dixon [phone]) on 06/12/2024 12:55:49 PM    Echocardiogram 04/26/2024  1. Left ventricular ejection fraction, by estimation, is 55 to 60%. Left  ventricular ejection fraction by 2D MOD biplane is 57.6 %. The left  ventricle has normal function. The left ventricle demonstrates regional  wall motion abnormalities (see scoring  diagram/findings for description). Left ventricular diastolic parameters  are consistent with Grade I diastolic dysfunction (impaired relaxation).   2. Right ventricular systolic function is normal. The right ventricular  size is normal. Tricuspid regurgitation signal is inadequate for assessing  PA pressure.   3. The mitral valve is grossly normal. Trivial mitral  valve  regurgitation.   4. The aortic valve is tricuspid. Aortic valve regurgitation is not  visualized. Aortic valve sclerosis/calcification is present, without any  evidence of aortic stenosis. Aortic valve mean gradient measures 8.6 mmHg.   5. The inferior vena cava is normal in size with greater than 50%  respiratory variability, suggesting right atrial pressure of 3 mmHg.   VAS US  aorta/IVC/iliacs 05/31/2023 Abdominal Aorta: There is evidence of abnormal dilatation of the mid  Abdominal aorta. The largest aortic measurement is 3.9 cm. The largest  aortic diameter remains essentially unchanged compared to prior exam.  Previous diameter measurement was 3.8 cm  obtained on 06/31/2024.    VAS US  ABI 05/31/2023 Right: Resting right ankle-brachial index indicates moderate right lower  extremity arterial disease. The right toe-brachial index is abnormal.   Left: Resting left ankle-brachial index indicates moderate left lower  extremity arterial disease. The left toe-brachial index is abnormal.    Echocardiogram 10/30/2022 1. Left ventricular ejection fraction, by estimation, is 55%. The left  ventricle has normal function. The left ventricle demonstrates regional  wall motion abnormalities with basal inferior severe hypokinesis. Left  ventricular diastolic parameters are  consistent with Grade I diastolic dysfunction (impaired relaxation).   2. Right ventricular systolic function is normal. The right ventricular  size is normal. Tricuspid  regurgitation signal is inadequate for assessing  PA pressure.   3. The mitral valve is normal in structure. No evidence of mitral valve  regurgitation. No evidence of mitral stenosis.   4. The aortic valve is tricuspid. There is moderate calcification of the  aortic valve. Aortic valve regurgitation is not visualized. Aortic valve  sclerosis/calcification is present, without any evidence of aortic  stenosis. Aortic valve mean gradient  measures 7.0  mmHg.   5. The inferior vena cava is normal in size with greater than 50%  respiratory variability, suggesting right atrial pressure of 3 mmHg.    Abdominal Aortogram 11/04/2022 1.  Severe stenosis at the junction of distal left common iliac artery and ostial left external iliac artery, flush occlusion of the left SFA with reconstitution via well-developed collaterals from the profunda and two-vessel runoff below the knee via the posterior and peroneal arteries. 2.  Moderate right external iliac artery stenosis.  Full runoff was not performed on the right lower extremity. 3.  Successful angioplasty and self-expanding stent placement to the ostial left external iliac artery extending into the distal common iliac artery.  Difficult procedure due to tortuosity with inability to perform the procedure from the contralateral approach that was successful from the left common femoral artery approach.  Risk Assessment/Calculations:              Physical Exam:   VS:  BP 112/64 (BP Location: Left Arm, Patient Position: Sitting, Cuff Size: Normal)   Pulse 92   Ht 5' 11.5 (1.816 m)   Wt 170 lb (77.1 kg)   BMI 23.38 kg/m    Wt Readings from Last 3 Encounters:  06/12/24 170 lb (77.1 kg)  05/01/24 164 lb 7.4 oz (74.6 kg)  04/25/24 172 lb 13.5 oz (78.4 kg)    GEN: Well nourished, well developed in no acute distress NECK: No JVD; No carotid bruits CARDIAC: RRR, no murmurs, rubs, gallops RESPIRATORY:  Clear to auscultation without rales, wheezing or rhonchi  ABDOMEN: Soft, non-tender, non-distended EXTREMITIES:  No edema; No acute deformity      Assessment and Plan:  Hypertension  Blood pressure today well-controlled at 112/64 - Unable to tolerate metoprolol  due to dizziness and low blood pressure - No changes.  Blood pressure well-controlled without medications   Chronic systolic heart failure LVEF 08/2022 was 40-45% with improvement of LVEF to 55% on 10/2022. 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 Volume status is stable.  Weight is stable without dyspnea, orthopnea, or PND - Continue Lasix  20 mg daily as needed    Peripheral arterial disease S/p left external iliac stenting on 10/2022 with excellent results completed for critical limb ischemia and slow healing ulceration affecting the left foot with subsequent healing of ulcer.  He still has left SFA occlusion but currently asymptomatic  - Walking well without claudication - Recent Dopplers 05/2024 showed stable ABI and patent left iliac stent.  He will have repeat Dopplers in 1 year - Continue aspirin  81 mg daily - Managed by Dr. Darron   Hyperlipidemia, LDL goal <70 LDL 61 on 04/2023 and under excellent control - Recently drawn at PCP office.  Will ask to fax results over to office for review - Continue rosuvastatin  10 mg daily  Coronary artery calcification CTA 03/2024 showed three-vessel coronary calcification Echo 03/2024 with stable LV function of 55 to 60% Elevated troponins  on 03/2024 likely demand in the setting of tramadol  overuse - Today he is stable without chest pains.  He denies any anginal symptoms.  No indication for further ischemic evaluation at this time - Continue rosuvastatin  10 mg daily and aspirin  81 mg daily   Prolonged QT Prolonged QT on EKG today.  Currently takes amitriptyline .  He should have further discussion with his PCP about coming off of TCA antidepressant and switching to another class      Dispo:  Return in about 6 months (around 12/11/2024).  Signed, Lum LITTIE Louis, NP

## 2024-07-05 ENCOUNTER — Other Ambulatory Visit: Payer: Self-pay | Admitting: Cardiovascular Disease

## 2024-07-12 ENCOUNTER — Other Ambulatory Visit: Payer: Self-pay | Admitting: Cardiovascular Disease
